# Patient Record
Sex: Female | Born: 1995
Health system: Southern US, Community
[De-identification: ages and names within clinical notes are randomized; demographics above are authoritative.]

## PROBLEM LIST (undated history)

## (undated) ENCOUNTER — Inpatient Hospital Stay (HOSPITAL_COMMUNITY): Payer: Self-pay

## (undated) DIAGNOSIS — F32A Depression, unspecified: Secondary | ICD-10-CM

## (undated) DIAGNOSIS — N189 Chronic kidney disease, unspecified: Secondary | ICD-10-CM

## (undated) DIAGNOSIS — F419 Anxiety disorder, unspecified: Secondary | ICD-10-CM

## (undated) DIAGNOSIS — F329 Major depressive disorder, single episode, unspecified: Secondary | ICD-10-CM

## (undated) DIAGNOSIS — N83209 Unspecified ovarian cyst, unspecified side: Secondary | ICD-10-CM

## (undated) DIAGNOSIS — K432 Incisional hernia without obstruction or gangrene: Secondary | ICD-10-CM

## (undated) DIAGNOSIS — N361 Urethral diverticulum: Secondary | ICD-10-CM

## (undated) DIAGNOSIS — R519 Headache, unspecified: Secondary | ICD-10-CM

## (undated) DIAGNOSIS — B999 Unspecified infectious disease: Secondary | ICD-10-CM

## (undated) DIAGNOSIS — A749 Chlamydial infection, unspecified: Secondary | ICD-10-CM

## (undated) HISTORY — PX: WISDOM TOOTH EXTRACTION: SHX21

## (undated) HISTORY — DX: Urethral diverticulum: N36.1

## (undated) HISTORY — DX: Anxiety disorder, unspecified: F41.9

## (undated) HISTORY — DX: Chlamydial infection, unspecified: A74.9

## (undated) HISTORY — PX: URETERAL STENT PLACEMENT: SHX822

---
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
## 2014-07-21 ENCOUNTER — Encounter (HOSPITAL_COMMUNITY): Payer: Self-pay | Admitting: *Deleted

## 2014-08-15 ENCOUNTER — Emergency Department (INDEPENDENT_AMBULATORY_CARE_PROVIDER_SITE_OTHER)
Admission: EM | Admit: 2014-08-15 | Discharge: 2014-08-15 | Disposition: A | Discharge status: Home / Self Care | Payer: Medicaid Other | Source: Home / Self Care | Attending: Emergency Medicine | Admitting: Emergency Medicine

## 2014-08-15 ENCOUNTER — Encounter (HOSPITAL_COMMUNITY): Payer: Self-pay | Admitting: Emergency Medicine

## 2014-08-15 DIAGNOSIS — M545 Low back pain, unspecified: Secondary | ICD-10-CM

## 2014-08-15 LAB — POCT URINALYSIS DIP (DEVICE)
Bilirubin Urine: NEGATIVE
Glucose, UA: NEGATIVE mg/dL
Ketones, ur: NEGATIVE mg/dL
NITRITE: NEGATIVE
PROTEIN: NEGATIVE mg/dL
Specific Gravity, Urine: 1.025 (ref 1.005–1.030)
UROBILINOGEN UA: 0.2 mg/dL (ref 0.0–1.0)
pH: 6 (ref 5.0–8.0)

## 2014-08-15 LAB — POCT PREGNANCY, URINE: PREG TEST UR: NEGATIVE

## 2014-08-15 MED ORDER — IBUPROFEN 800 MG PO TABS: 800.0000 mg | ORAL_TABLET | Freq: Three times a day (TID) | ORAL | Status: DC | PRN

## 2014-08-15 NOTE — ED Notes (Signed)
C/o lower back pain onset 3 weeks Pain is getting worse; increases w/activity Denies abd pain, urinary sx, f/v/n/d, denies inj/trauma Alert, no signs of acute distress.

## 2014-08-15 NOTE — Discharge Instructions (Signed)
Back Exercises °Back exercises help treat and prevent back injuries. The goal is to increase your strength in your belly (abdominal) and back muscles. These exercises can also help with flexibility. Start these exercises when told by your doctor. °HOME CARE °Back exercises include: °Pelvic Tilt. °· Lie on your back with your knees bent. Tilt your pelvis until the lower part of your back is against the floor. Hold this position 5 to 10 sec. Repeat this exercise 5 to 10 times. °Knee to Chest. °· Pull 1 knee up against your chest and hold for 20 to 30 seconds. Repeat this with the other knee. This may be done with the other leg straight or bent, whichever feels better. Then, pull both knees up against your chest. °Sit-Ups or Curl-Ups. °· Bend your knees 90 degrees. Start with tilting your pelvis, and do a partial, slow sit-up. Only lift your upper half 30 to 45 degrees off the floor. Take at least 2 to 3 seonds for each sit-up. Do not do sit-ups with your knees out straight. If partial sit-ups are difficult, simply do the above but with only tightening your belly (abdominal) muscles and holding it as told. °Hip-Lift. °· Lie on your back with your knees flexed 90 degrees. Push down with your feet and shoulders as you raise your hips 2 inches off the floor. Hold for 10 seconds, repeat 5 to 10 times. °Back Arches. °· Lie on your stomach. Prop yourself up on bent elbows. Slowly press on your hands, causing an arch in your low back. Repeat 3 to 5 times. °Shoulder-Lifts. °· Lie face down with arms beside your body. Keep hips and belly pressed to floor as you slowly lift your head and shoulders off the floor. °Do not overdo your exercises. Be careful in the beginning. Exercises may cause you some mild back discomfort. If the pain lasts for more than 15 minutes, stop the exercises until you see your doctor. Improvement with exercise for back problems is slow.  °Document Released: 11/11/2010 Document Revised: 01/01/2012  Document Reviewed: 08/10/2011 °ExitCare® Patient Information ©2015 [employer]. This information is not intended to replace advice given to you by your health care provider. Make sure you discuss any questions you have with your health care provider. °Back Pain, Adult °Low back pain is very common. About 1 in 5 people have back pain. The cause of low back pain is rarely dangerous. The pain often gets better over time. About half of people with a sudden onset of back pain feel better in just 2 weeks. About 8 in 10 people feel better by 6 weeks.  °CAUSES °Some common causes of back pain include: °· Strain of the muscles or ligaments supporting the spine. °· Wear and tear (degeneration) of the spinal discs. °· Arthritis. °· Direct injury to the back. °DIAGNOSIS °Most of the time, the direct cause of low back pain is not known. However, back pain can be treated effectively even when the exact cause of the pain is unknown. Answering your caregiver's questions about your overall health and symptoms is one of the most accurate ways to make sure the cause of your pain is not dangerous. If your caregiver needs more information, he or she may order lab work or imaging tests (X-rays or MRIs). However, even if imaging tests show changes in your back, this usually does not require surgery. °HOME CARE INSTRUCTIONS °For many people, back pain returns. Since low back pain is rarely dangerous, it is often a condition that people can learn to manage on their   own.  °· Remain active. It is stressful on the back to sit or stand in one place. Do not sit, drive, or stand in one place for more than 30 minutes at a time. Take short walks on level surfaces as soon as pain allows. Try to increase the length of time you walk each day. °· Do not stay in bed. Resting more than 1 or 2 days can delay your recovery. °· Do not avoid exercise or work. Your body is made to move. It is not dangerous to be active, even though your back may hurt. Your  back will likely heal faster if you return to being active before your pain is gone. °· Pay attention to your body when you  bend and lift. Many people have less discomfort when lifting if they bend their knees, keep the load close to their bodies, and avoid twisting. Often, the most comfortable positions are those that put less stress on your recovering back. °· Find a comfortable position to sleep. Use a firm mattress and lie on your side with your knees slightly bent. If you lie on your back, put a pillow under your knees. °· Only take over-the-counter or prescription medicines as directed by your caregiver. Over-the-counter medicines to reduce pain and inflammation are often the most helpful. Your caregiver may prescribe muscle relaxant drugs. These medicines help dull your pain so you can more quickly return to your normal activities and healthy exercise. °· Put ice on the injured area. °¨ Put ice in a plastic bag. °¨ Place a towel between your skin and the bag. °¨ Leave the ice on for 15-20 minutes, 03-04 times a day for the first 2 to 3 days. After that, ice and heat may be alternated to reduce pain and spasms. °· Ask your caregiver about trying back exercises and gentle massage. This may be of some benefit. °· Avoid feeling anxious or stressed. Stress increases muscle tension and can worsen back pain. It is important to recognize when you are anxious or stressed and learn ways to manage it. Exercise is a great option. °SEEK MEDICAL CARE IF: °· You have pain that is not relieved with rest or medicine. °· You have pain that does not improve in 1 week. °· You have new symptoms. °· You are generally not feeling well. °SEEK IMMEDIATE MEDICAL CARE IF:  °· You have pain that radiates from your back into your legs. °· You develop new bowel or bladder control problems. °· You have unusual weakness or numbness in your arms or legs. °· You develop nausea or vomiting. °· You develop abdominal pain. °· You feel  faint. °Document Released: 10/09/2005 Document Revised: 04/09/2012 Document Reviewed: 02/10/2014 °ExitCare® Patient Information ©2015 [employer]. This information is not intended to replace advice given to you by your health care provider. Make sure you discuss any questions you have with your health care provider. ° °

## 2014-08-15 NOTE — ED Provider Notes (Signed)
CSN: [id number]     Arrival date & time 08/15/14  1213 History   First MD Initiated Contact with Patient 08/15/14 1236     Chief Complaint  Patient presents with  . Back Pain   (Consider location/radiation/quality/duration/timing/severity/associated sxs/prior Treatment) HPI      18 year old female presents for evaluation of low back pain. This started 3 weeks again. It is midline and outflow left side of her back. She denies any extremity numbness or weakness, no loss of bowel or bladder control. Denies any trauma. She recently gave birth to her first child. No history of IV drug use. No significant history of back pain.  Past Medical History  Diagnosis Date  . Incisional hernia 04/2013   Past Surgical History  Procedure Laterality Date  . Wisdom tooth extraction    . Ercp  03/12/2012    Procedure: ENDOSCOPIC RETROGRADE CHOLANGIOPANCREATOGRAPHY (ERCP);  Surgeon: Petra KubaMarc E Magod, MD;  [facility];  Service: Endoscopy;  Laterality: N/A;  . Cholecystectomy  04/03/2012    Procedure: LAPAROSCOPIC CHOLECYSTECTOMY WITH INTRAOPERATIVE CHOLANGIOGRAM;  Surgeon: Velora Hecklerodd M Gerkin, MD;  [facility];  Service: General;  Laterality: N/A;  . Inguinal hernia repair N/A 05/15/2013    Procedure: HERNIA REPAIR INCISIONAL;  Surgeon: Velora Hecklerodd M Gerkin, MD;  Location: [facility];  Service: General;  Laterality: N/A;  . Insertion of mesh N/A 05/15/2013    Procedure: INSERTION OF MESH;  Surgeon: Velora Hecklerodd M Gerkin, MD;  Location: [facility];  Service: General;  Laterality: N/A;   Family History  Problem Relation Age of Onset  . Hypertension Mother   . Diabetes Paternal Grandmother   . Cancer Paternal Grandfather   . Seizures Paternal Grandfather   . Hypertension Father    History  Substance Use Topics  . Smoking status: Never Smoker   . Smokeless tobacco: Never Used  . Alcohol Use: No   OB History   Grav Para Term Preterm Abortions TAB SAB Ect Mult Living   2 1 0 1 0 0 0 0 0 1      Review of Systems  Musculoskeletal: Positive for back pain.  All other systems reviewed and are negative.   Allergies  Penicillins  Home Medications   Prior to Admission medications   Medication Sig Start Date End Date Taking? Authorizing Provider  ibuprofen (ADVIL,MOTRIN) 800 MG tablet Take 1 tablet (800 mg total) by mouth every 8 (eight) hours as needed. 08/15/14   Graylon GoodZachary H Kasarah Sitts, PA-C  Prenatal Vit-Fe Fumarate-FA (PRENATAL MULTIVITAMIN) TABS tablet Take 1 tablet by mouth daily at 12 noon.    Historical Provider, MD   BP 127/84  Pulse 79  Temp(Src) 98.7 F (37.1 C)  Resp 12  SpO2 97%  Breastfeeding? Yes Physical Exam  Nursing note and vitals reviewed. Constitutional: She is oriented to person, place, and time. Vital signs are normal. She appears well-developed and well-nourished. No distress.  HENT:  Head: Normocephalic and atraumatic.  Pulmonary/Chest: Effort normal. No respiratory distress.  Musculoskeletal:       Lumbar back: She exhibits tenderness (Left paraspinous musculature). She exhibits normal range of motion, no bony tenderness, no swelling, no edema, no deformity, no pain and no spasm.  Neurological: She is alert and oriented to person, place, and time. She has normal strength and normal reflexes. No sensory deficit. She exhibits normal muscle tone. Coordination and gait normal.  Skin: Skin is warm and dry. No rash noted. She is not diaphoretic.  Psychiatric: She has a normal  mood and affect. Judgment normal.    ED Course  Procedures (including critical care time) Labs Review Labs Reviewed  POCT URINALYSIS DIP (DEVICE) - Abnormal; Notable for the following:    Hgb urine dipstick TRACE (*)    Leukocytes, UA MODERATE (*)    All other components within normal limits  POCT PREGNANCY, URINE    Imaging Review No results found.   MDM   1. Bilateral low back pain without sciatica    No red flags. Treat symptomatically with ibuprofen. Followup when  necessary  Meds ordered this encounter  Medications  . DISCONTD: ibuprofen (ADVIL,MOTRIN) 800 MG tablet    Sig: Take 1 tablet (800 mg total) by mouth every 8 (eight) hours as needed.    Dispense:  60 tablet    Refill:  0    Order Specific Question:  Supervising Provider    Answer:  Clementeen GrahamOREY, EVAN, S [id number]]  . ibuprofen (ADVIL,MOTRIN) 800 MG tablet    Sig: Take 1 tablet (800 mg total) by mouth every 8 (eight) hours as needed.    Dispense:  60 tablet    Refill:  0    Order Specific Question:  Supervising Provider    Answer:  Clementeen GrahamOREY, EVAN, S [[id number]]   \    Graylon GoodZachary H Olar Santini, PA-C 08/15/14 1459

## 2014-08-16 NOTE — ED Provider Notes (Signed)
Medical screening examination/treatment/procedure(s) were performed by non-physician practitioner and as supervising physician I was immediately available for consultation/collaboration.  Cecilie Heidel, M.D.  Akeila Lana C Angelina Venard, MD 08/16/14 0839 

## 2014-08-23 ENCOUNTER — Emergency Department (HOSPITAL_COMMUNITY)
Admission: EM | Admit: 2014-08-23 | Discharge: 2014-08-24 | Disposition: A | Discharge status: Home / Self Care | Payer: Medicaid Other | Attending: Emergency Medicine | Admitting: Emergency Medicine

## 2014-08-23 ENCOUNTER — Encounter (HOSPITAL_COMMUNITY): Payer: Self-pay | Admitting: Emergency Medicine

## 2014-08-23 DIAGNOSIS — R42 Dizziness and giddiness: Secondary | ICD-10-CM | POA: Insufficient documentation

## 2014-08-23 DIAGNOSIS — Z3202 Encounter for pregnancy test, result negative: Secondary | ICD-10-CM | POA: Insufficient documentation

## 2014-08-23 DIAGNOSIS — N926 Irregular menstruation, unspecified: Secondary | ICD-10-CM

## 2014-08-23 DIAGNOSIS — Z880 Allergy status to penicillin: Secondary | ICD-10-CM | POA: Insufficient documentation

## 2014-08-23 DIAGNOSIS — N925 Other specified irregular menstruation: Secondary | ICD-10-CM | POA: Insufficient documentation

## 2014-08-23 DIAGNOSIS — R197 Diarrhea, unspecified: Secondary | ICD-10-CM | POA: Diagnosis not present

## 2014-08-23 DIAGNOSIS — R1084 Generalized abdominal pain: Secondary | ICD-10-CM | POA: Insufficient documentation

## 2014-08-23 DIAGNOSIS — J028 Acute pharyngitis due to other specified organisms: Secondary | ICD-10-CM | POA: Insufficient documentation

## 2014-08-23 DIAGNOSIS — J029 Acute pharyngitis, unspecified: Secondary | ICD-10-CM | POA: Diagnosis present

## 2014-08-23 DIAGNOSIS — Z79899 Other long term (current) drug therapy: Secondary | ICD-10-CM | POA: Insufficient documentation

## 2014-08-23 DIAGNOSIS — B9789 Other viral agents as the cause of diseases classified elsewhere: Secondary | ICD-10-CM | POA: Diagnosis not present

## 2014-08-23 NOTE — ED Notes (Signed)
Pt states she has a sore throat and is very dizzy  Pt states sxs started 3 days ago  Pt states she has been having diarrhea since yesterday and her stomach hurts

## 2014-08-24 ENCOUNTER — Encounter (HOSPITAL_COMMUNITY): Payer: Self-pay | Admitting: Emergency Medicine

## 2014-08-24 LAB — URINALYSIS, ROUTINE W REFLEX MICROSCOPIC
Bilirubin Urine: NEGATIVE
Glucose, UA: NEGATIVE mg/dL
Hgb urine dipstick: NEGATIVE
Ketones, ur: NEGATIVE mg/dL
Nitrite: NEGATIVE
Protein, ur: NEGATIVE mg/dL
Specific Gravity, Urine: 1.021 (ref 1.005–1.030)
Urobilinogen, UA: 1 mg/dL (ref 0.0–1.0)
pH: 7 (ref 5.0–8.0)

## 2014-08-24 LAB — CBC WITH DIFFERENTIAL/PLATELET
Basophils Absolute: 0 10*3/uL (ref 0.0–0.1)
Basophils Relative: 0 % (ref 0–1)
Eosinophils Absolute: 0.1 10*3/uL (ref 0.0–0.7)
Eosinophils Relative: 1 % (ref 0–5)
HCT: 42.9 % (ref 36.0–46.0)
Hemoglobin: 14.8 g/dL (ref 12.0–15.0)
Lymphocytes Relative: 33 % (ref 12–46)
Lymphs Abs: 2.7 10*3/uL (ref 0.7–4.0)
MCH: 31.2 pg (ref 26.0–34.0)
MCHC: 34.5 g/dL (ref 30.0–36.0)
MCV: 90.3 fL (ref 78.0–100.0)
Monocytes Absolute: 0.5 10*3/uL (ref 0.1–1.0)
Monocytes Relative: 6 % (ref 3–12)
Neutro Abs: 4.9 10*3/uL (ref 1.7–7.7)
Neutrophils Relative %: 60 % (ref 43–77)
Platelets: 260 10*3/uL (ref 150–400)
RBC: 4.75 MIL/uL (ref 3.87–5.11)
RDW: 12.8 % (ref 11.5–15.5)
WBC: 8.1 10*3/uL (ref 4.0–10.5)

## 2014-08-24 LAB — COMPREHENSIVE METABOLIC PANEL
ALT: 50 U/L — ABNORMAL HIGH (ref 0–35)
AST: 45 U/L — ABNORMAL HIGH (ref 0–37)
Albumin: 4.3 g/dL (ref 3.5–5.2)
Alkaline Phosphatase: 123 U/L — ABNORMAL HIGH (ref 39–117)
Anion gap: 14 (ref 5–15)
BUN: 13 mg/dL (ref 6–23)
CO2: 22 mEq/L (ref 19–32)
Calcium: 9.2 mg/dL (ref 8.4–10.5)
Chloride: 102 mEq/L (ref 96–112)
Creatinine, Ser: 0.57 mg/dL (ref 0.50–1.10)
GFR calc Af Amer: >90 mL/min (ref >90)
GFR calc non Af Amer: >90 mL/min (ref >90)
Glucose, Bld: 95 mg/dL (ref 70–99)
Potassium: 3.9 mEq/L (ref 3.7–5.3)
Sodium: 138 mEq/L (ref 137–147)
Total Bilirubin: 0.3 mg/dL (ref 0.3–1.2)
Total Protein: 8.2 g/dL (ref 6.0–8.3)

## 2014-08-24 LAB — URINE MICROSCOPIC-ADD ON

## 2014-08-24 LAB — POC URINE PREG, ED: Preg Test, Ur: NEGATIVE

## 2014-08-24 LAB — RAPID STREP SCREEN (MED CTR MEBANE ONLY): Streptococcus, Group A Screen (Direct): NEGATIVE

## 2014-08-24 NOTE — Discharge Instructions (Signed)
°Emergency Department Resource Guide °1) Find a Doctor and Pay Out of Pocket °Although you won't have to find out who is covered by your insurance plan, it is a good idea to ask around and get recommendations. You will then need to call the office and see if the doctor you have chosen will accept you as a new patient and what types of options they offer for patients who are self-pay. Some doctors offer discounts or will set up payment plans for their patients who do not have insurance, but you will need to ask so you aren't surprised when you get to your appointment. ° °2) Contact Your Local Health Department °Not all health departments have doctors that can see patients for sick visits, but many do, so it is worth a call to see if yours does. If you don't know where your local health department is, you can check in your phone book. The CDC also has a tool to help you locate your state's health department, and many state websites also have listings of all of their local health departments. ° °3) Find a Walk-in Clinic °If your illness is not likely to be very severe or complicated, you may want to try a walk in clinic. These are popping up all over the country in pharmacies, drugstores, and shopping centers. They're usually staffed by nurse practitioners or physician assistants that have been trained to treat common illnesses and complaints. They're usually fairly quick and inexpensive. However, if you have serious medical issues or chronic medical problems, these are probably not your best option. ° °No Primary Care Doctor: °- Call [facility] at  832-8000 - they can help you locate a primary care doctor that  accepts your insurance, provides certain services, etc. °- Physician Referral Service- 1-800-533-3463 ° °Chronic Pain Problems: °Organization         Address  Phone   Notes  °[facility]  (336) 297-2271 Patients need to be referred by their primary care doctor.  ° °Medication  Assistance: °Organization         Address  Phone   Notes  °[place] [facility] [address] °[place], [place] [address] (336) 641-8030 --Must be a resident of [place] °-- Must have NO insurance coverage whatsoever (no Medicaid/ Medicare, etc.) °-- The pt. MUST have a primary care doctor that directs their care regularly and follows them in the community °  °[facility]  (866) 331-1348   °[employer]  (888) 892-1162   ° °Agencies that provide inexpensive medical care: °Organization         Address  Phone   Notes  °[facility]  (336) 832-8035   °[facility]    (336) 832-7272   °[facility] [address] °[place], [place] [address] (336) 832-4777   °[facility] [address], °[place] (336) 271-4999   °[facility]    (336) 373-0678   °[facility]    (336) 272-1050   °[facility] ° [address], [place] Phone:  (336) 832-4444, Fax:  (336) 832-4440 Hours of Operation:  9 am - 6 pm, M-F.  Also accepts Medicaid/Medicare and self-pay.  °[facility] ° [address], [place] Phone: (336) 832-3150, Fax: (336) 832-3151. Hours of Operation:  8:30 am - 5:30 pm, M-F.  Also accepts Medicaid and self-pay.  °[facility] [place] [address], [place] Phone: (336) 878-6027   °[facility] [address], [place], [place] (336)723-1848, Ext. 123 Mondays & Thursdays: 7-9 AM.  First 15 patients are seen on a first come, first serve basis. °  ° °Medicaid-accepting [place] Providers: ° °Organization         Address  Phone   Notes  °[facility] [address], [place] (336) 641-2100 Also accepts self-pay patients.  °[facility] [address], [place] ° (336) 856-9996   °[facility] [address], [place]  (336) 288-8857   °[facility] [address] [place] [address], [place] (336) 299-7000   °Veita Bland [address], [place]  ° (336) 373-1557 Only accepts [place] Access Medicaid patients after they have their name applied to their card.  ° °Self-Pay (no insurance) in [place]: ° °Organization         Address  Phone   Notes  °Sickle Cell Patients, [facility] [address], [place] (336) 832-1970   °[facility] [address], [place] (336) 832-4400   °[facility] ° [address], [place] (336) 992-4800   °[facility]/Dr. Osei-Bonsu ° [address] [place] [address], [place] or [address], [place] (336) 841-8500 Phone number for both [place] and [place] locations is the same.  °[facility] [address], [place] (336) 299-0000   °[facility] [address] [place] [address], [place] or [address] (336) 852-7530 °(336) 878-2260   °[facility] [address], [place] (336) 350-1642, phone; (336) 294-5005, fax Sees patients 1st and 3rd Saturday of every month.  Must not qualify for public or private insurance (i.e. Medicaid, Medicare, [place] Health Choice, Veterans' Benefits) • Household income should be no more than 200% of the poverty level •The clinic cannot treat you if you are pregnant or think you are pregnant • Sexually transmitted diseases are not treated at the clinic.  ° ° °Dental Care: °Organization         Address  Phone  Notes  °[place] [facility] [address], [place] (336) 641-6152 Accepts children up to age 21 who are enrolled in Medicaid or [place] Health Choice; pregnant women with a Medicaid card; and children who have applied for Medicaid or [place] Health Choice, but were declined, whose parents can pay a reduced fee at time of service.  °[place]  [facility] [place]  [address], [place] (336) 641-7733 Accepts children up to age 21 who are enrolled in Medicaid or [place] Health Choice; pregnant women with a Medicaid card; and children who have applied for Medicaid or [place] Health Choice, but were declined, whose parents can pay a reduced fee at time of service.  °[facility] ° [address], [place] (336) 641-4533 Patients are seen by appointment only. Walk-ins are not accepted. [facility] will see patients 18 years of age and older. °Monday - Tuesday (8am-5pm) °Most Wednesdays (8:30-5pm) °$30 per visit, cash only  °[facility] ° [address], [place] (336) 641-4533 Patients are seen by appointment only. Walk-ins are not accepted. [facility] will see patients 18 years of age and older. °One   Wednesday Evening (Monthly: Volunteer Based).  $30 per visit, cash only  °[facility]  (919) 537-3737 for adults; Children under age 4, call Graduate Pediatric Dentistry at (919) [phone]. Children aged 4-14, please call (919) [phone] to request a pediatric application. ° Dental services are provided in all areas of dental care including fillings, crowns and bridges, complete and partial dentures, implants, gum treatment, root canals, and extractions. Preventive care is also provided. Treatment is provided to both adults and children. °Patients are selected via a lottery and there is often a waiting list. °  °[facility] [address], °[place] ° (336) 763-8833 [email or web address] °  °[facility] [address], [place], [place] (336)723-1848, Ext. 123 Second and Fourth Thursday of each month, opens at 6:30 AM; Clinic ends at 9 AM.  Patients are seen on a first-come first-served basis, and a limited number are seen during each clinic.  ° °[facility] ° [address], [place], [place] (336) 723-7904    Eligibility Requirements °You must have lived in [place], [place], or [place] counties for at least the last three months. °  You cannot be eligible for state or federal sponsored healthcare insurance, including Veterans Administration, Medicaid, or Medicare. °  You generally cannot be eligible for healthcare insurance through your employer.  °  How to apply: °Eligibility screenings are held every Tuesday and Wednesday afternoon from 1:00 pm until 4:00 pm. You do not need an appointment for the interview!  °[facility] [address], [place], [place] 336-631-2330   °[facility]  336-342-8273   °[facility]  336-703-3100   °[facility]  336-570-6415   ° °Behavioral Health Resources in the Community: °Intensive Outpatient Programs °Organization         Address  Phone  Notes  °[facility] [address], [place], [place] 336-878-6098   °[facility] [address], [place], [place] 336-832-9800   °[facility] [address], [place], [place] ° 336-882-2125   °[facility] [address],  °[place], [place] 1-800-853-5163 or 336-641-4981   °Substance Abuse Resources °Organization         Address  Phone  Notes  °[facility]  336-882-2125   °[facility]  336-784-9470   °[facility]  336-285-9073   °[facility]  336-845-3988   °Residential & Outpatient Substance Abuse Program  1-800-659-3381   °Psychological Services °Organization         Address  Phone  Notes  °[facility]  336- 832-9600   °[facility]  336- 378-7881   °[facility] [address], [place] 1-800-853-5163 or 336-641-4981   ° °Mobile Crisis Teams °Organization         Address  Phone  Notes  °[facility], Mobile Crisis Care Unit  1-877-626-1772   °Assertive °[facility] ° [address]  [place], [place] 336-834-9664   °Sharon DeEsch [address] °[place] 336-554-5454   ° °Self-Help/Support Groups °Organization         Address  Phone             Notes  °[facility] - variety of support groups  336- 373-1402 Call for more information  °Narcotics Anonymous (NA), [facility] [address], °[place]   2 meetings at this location  ° °  Residential Treatment Programs °Organization         Address  Phone  Notes  °[facility] [address],    °[place]  1-866-801-8205   °[facility] ° [address], [place], [place] 704-293-8524   °[facility] [address], [place] 336-845-3988 Admissions: 8am-3pm M-F  °[facility] [address]    °[place], [place] 336-841-1104   °[facility] [address], [place], [place] 336-379-7146   °[facility] [address]  °[place], [place] 336-285-9073   °[facility] - Intensive Outpatient [address], [place], [place] 336-852-3033   °[facility] [address]  °[place], [place] 1-877-615-2722 or 336-784-9470   °[facility] [address] [place], [place] 336-227-7417 Accepts Medicaid  °[facility] [address]  °[place] 1-800-659-3381 Substance Abuse/Addiction Treatment  ° °[place] Behavioral Health Resources °Organization         Address  Phone  Notes  °[facility]  (888) 581-9988   °Julie Brannon, PhD [address] [place], [place]   (336) 349-5553 or (336) 951-0000   °[facility]   [address] °[place], [place] (336) 349-4454   °[facility] [address], [place], [place] (336) 342-8316 Insurance/Medicaid/sponsorship through [facility]  °[facility] [address]                                    [place], [place] (336) 342-8316 Therapy/tele-psych/case    °[facility] [address]  ° [place], [place] (336) 349-2233    °Dr. Arfeen  (336) 349-4544   °[facility] [place]  [employer] [place] [facility] 1) [address],  °2) [address], [place] °3)  [address], [place] (336) 349-3220 °(336) 342-7768 ° °(336) 342-8140   °[place] [facility] Abuse [facility] (336) 342-1394 or (336) 342-3537 (After Hours)    ° ° °

## 2014-08-25 LAB — CULTURE, GROUP A STREP

## 2014-08-31 NOTE — ED Provider Notes (Signed)
CSN: [id number]     Arrival date & time 08/23/14  2222 History   First MD Initiated Contact with Patient 08/24/14 0153     Chief Complaint  Patient presents with  . Sore Throat  . Dizziness     (Consider location/radiation/quality/duration/timing/severity/associated sxs/prior Treatment) HPI   18yF with sore throat. Onset about 3 days ago. Initially, patient feels dizzy. She just Sensation of feeling off balance or that she may pass out. Denies vertigo. Mild diffuse, crampy abdominal pain. She has been having some diarrhea. No urinary complaints. No unusual vaginal bleeding or discharge. She does has concerns she may be pregnant. She did have a home pregnancy test which was positive. She is sexually active.   Past Medical History  Diagnosis Date  . Incisional hernia 04/2013   Past Surgical History  Procedure Laterality Date  . Wisdom tooth extraction    . Ercp  03/12/2012    Procedure: ENDOSCOPIC RETROGRADE CHOLANGIOPANCREATOGRAPHY (ERCP);  Surgeon: Petra KubaMarc E Magod, MD;  [facility];  Service: Endoscopy;  Laterality: N/A;  . Cholecystectomy  04/03/2012    Procedure: LAPAROSCOPIC CHOLECYSTECTOMY WITH INTRAOPERATIVE CHOLANGIOGRAM;  Surgeon: Velora Hecklerodd M Gerkin, MD;  [facility];  Service: General;  Laterality: N/A;  . Inguinal hernia repair N/A 05/15/2013    Procedure: HERNIA REPAIR INCISIONAL;  Surgeon: Velora Hecklerodd M Gerkin, MD;  Location: [facility];  Service: General;  Laterality: N/A;  . Insertion of mesh N/A 05/15/2013    Procedure: INSERTION OF MESH;  Surgeon: Velora Hecklerodd M Gerkin, MD;  Location: [facility];  Service: General;  Laterality: N/A;   Family History  Problem Relation Age of Onset  . Hypertension Mother   . Diabetes Paternal Grandmother   . Cancer Paternal Grandfather   . Seizures Paternal Grandfather   . Hypertension Father    History  Substance Use Topics  . Smoking status: Never Smoker   . Smokeless tobacco: Never Used  . Alcohol Use: No   OB  History    Gravida Para Term Preterm AB TAB SAB Ectopic Multiple Living   2 1 0 1 0 0 0 0 0 1      Review of Systems  All systems reviewed and negative, other than as noted in HPI.   Allergies  Penicillins  Home Medications   Prior to Admission medications   Medication Sig Start Date End Date Taking? Authorizing Provider  Prenatal Vit-Fe Fumarate-FA (PRENATAL MULTIVITAMIN) TABS tablet Take 1 tablet by mouth daily at 12 noon.   Yes Historical Provider, MD  ibuprofen (ADVIL,MOTRIN) 800 MG tablet Take 1 tablet (800 mg total) by mouth every 8 (eight) hours as needed. 08/15/14   Adrian BlackwaterZachary H Baker, PA-C   BP 127/57 mmHg  Pulse 86  Temp(Src) 97.9 F (36.6 C) (Oral)  Resp 18  Ht 5\' 5"  (1.651 m)  Wt 192 lb (87.091 kg)  BMI 31.95 kg/m2  SpO2 100%  LMP 07/25/2014 Physical Exam  Constitutional: She appears well-developed and well-nourished. No distress.  HENT:  Head: Normocephalic and atraumatic.  Posterior pharynx beefy. Uvula midline. Handling secretions. Normal sounding phonation. Neck supple. No nodes.   Eyes: Conjunctivae and EOM are normal. Pupils are equal, round, and reactive to light. Right eye exhibits no discharge. Left eye exhibits no discharge.  Neck: Neck supple.  Cardiovascular: Normal rate, regular rhythm and normal heart sounds.  Exam reveals no gallop and no friction rub.   No murmur heard. Pulmonary/Chest: Effort normal and breath sounds normal. No respiratory distress.  Abdominal:  Soft. She exhibits no distension. There is no tenderness.  Musculoskeletal: She exhibits no edema or tenderness.  Neurological: She is alert.  Skin: Skin is warm and dry. She is not diaphoretic.  Psychiatric: She has a normal mood and affect. Her behavior is normal. Thought content normal.  Nursing note and vitals reviewed.   ED Course  Procedures (including critical care time) Labs Review Labs Reviewed  URINALYSIS, ROUTINE W REFLEX MICROSCOPIC - Abnormal; Notable for the following:     Leukocytes, UA MODERATE (*)    All other components within normal limits  COMPREHENSIVE METABOLIC PANEL - Abnormal; Notable for the following:    AST 45 (*)    ALT 50 (*)    Alkaline Phosphatase 123 (*)    All other components within normal limits  RAPID STREP SCREEN  CULTURE, GROUP A STREP  CBC WITH DIFFERENTIAL  URINE MICROSCOPIC-ADD ON  POC URINE PREG, ED    Imaging Review No results found.   EKG Interpretation None      MDM   Final diagnoses:  Viral pharyngitis  Menstrual period late    18yF with likely viral pharyngitis. Seems more concerned about positive home pregnancy test though. Negative in ED. W/u fairly unremarkable. I feel stable at this time. No evidence of serious deep space head/neck infection.     Raeford RazorStephen Kalissa Grays, MD 09/02/14 [phone]

## 2014-12-29 ENCOUNTER — Encounter (HOSPITAL_COMMUNITY): Payer: Self-pay

## 2014-12-29 ENCOUNTER — Emergency Department (HOSPITAL_COMMUNITY): Payer: Medicaid Other

## 2014-12-29 ENCOUNTER — Emergency Department (HOSPITAL_COMMUNITY)
Admission: EM | Admit: 2014-12-29 | Discharge: 2014-12-29 | Disposition: A | Discharge status: Home / Self Care | Payer: Medicaid Other | Attending: Emergency Medicine | Admitting: Emergency Medicine

## 2014-12-29 DIAGNOSIS — M542 Cervicalgia: Secondary | ICD-10-CM

## 2014-12-29 DIAGNOSIS — O9A212 Injury, poisoning and certain other consequences of external causes complicating pregnancy, second trimester: Secondary | ICD-10-CM | POA: Insufficient documentation

## 2014-12-29 DIAGNOSIS — S161XXA Strain of muscle, fascia and tendon at neck level, initial encounter: Secondary | ICD-10-CM | POA: Diagnosis not present

## 2014-12-29 DIAGNOSIS — H9201 Otalgia, right ear: Secondary | ICD-10-CM

## 2014-12-29 DIAGNOSIS — Y939 Activity, unspecified: Secondary | ICD-10-CM | POA: Insufficient documentation

## 2014-12-29 DIAGNOSIS — H9190 Unspecified hearing loss, unspecified ear: Secondary | ICD-10-CM | POA: Diagnosis not present

## 2014-12-29 DIAGNOSIS — S0083XA Contusion of other part of head, initial encounter: Secondary | ICD-10-CM

## 2014-12-29 DIAGNOSIS — Y999 Unspecified external cause status: Secondary | ICD-10-CM | POA: Diagnosis not present

## 2014-12-29 DIAGNOSIS — Z880 Allergy status to penicillin: Secondary | ICD-10-CM | POA: Diagnosis not present

## 2014-12-29 DIAGNOSIS — Z3A22 22 weeks gestation of pregnancy: Secondary | ICD-10-CM | POA: Insufficient documentation

## 2014-12-29 DIAGNOSIS — Y92410 Unspecified street and highway as the place of occurrence of the external cause: Secondary | ICD-10-CM | POA: Insufficient documentation

## 2014-12-29 DIAGNOSIS — S0991XA Unspecified injury of ear, initial encounter: Secondary | ICD-10-CM | POA: Insufficient documentation

## 2014-12-29 DIAGNOSIS — Z8719 Personal history of other diseases of the digestive system: Secondary | ICD-10-CM | POA: Diagnosis not present

## 2014-12-29 DIAGNOSIS — S43402A Unspecified sprain of left shoulder joint, initial encounter: Secondary | ICD-10-CM

## 2014-12-29 DIAGNOSIS — S0990XA Unspecified injury of head, initial encounter: Secondary | ICD-10-CM | POA: Diagnosis not present

## 2014-12-29 MED ORDER — ACETAMINOPHEN 325 MG PO TABS
650.0000 mg | ORAL_TABLET | Freq: Once | ORAL | Status: AC
  Administered 2014-12-29: 650 mg via ORAL
  Filled 2014-12-29: qty 2
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
## 2014-12-29 NOTE — ED Notes (Signed)
Rapid Response OB at bedside.  
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
## 2014-12-29 NOTE — Progress Notes (Signed)
This note also relates to the following rows which could not be included: Pulse Rate - Cannot attach notes to unvalidated device data SpO2 - Cannot attach notes to unvalidated device data   Fetal movement palpated.
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
## 2014-12-29 NOTE — ED Notes (Signed)
Pt stable, ambulatory, pain 6/10, refuses wheelchair
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
## 2015-02-05 ENCOUNTER — Encounter (HOSPITAL_COMMUNITY): Payer: Self-pay | Admitting: *Deleted

## 2015-02-05 ENCOUNTER — Inpatient Hospital Stay (HOSPITAL_COMMUNITY)
Admission: AD | Admit: 2015-02-05 | Discharge: 2015-02-05 | Disposition: A | Discharge status: Home / Self Care | Payer: Medicaid Other | Source: Ambulatory Visit | Attending: Obstetrics & Gynecology | Admitting: Obstetrics & Gynecology

## 2015-02-05 DIAGNOSIS — R42 Dizziness and giddiness: Secondary | ICD-10-CM | POA: Insufficient documentation

## 2015-02-05 DIAGNOSIS — Z3A27 27 weeks gestation of pregnancy: Secondary | ICD-10-CM | POA: Diagnosis not present

## 2015-02-05 DIAGNOSIS — M545 Low back pain: Secondary | ICD-10-CM | POA: Insufficient documentation

## 2015-02-05 DIAGNOSIS — O9989 Other specified diseases and conditions complicating pregnancy, childbirth and the puerperium: Secondary | ICD-10-CM | POA: Diagnosis not present

## 2015-02-05 DIAGNOSIS — O26893 Other specified pregnancy related conditions, third trimester: Secondary | ICD-10-CM | POA: Diagnosis not present

## 2015-02-05 DIAGNOSIS — R519 Headache, unspecified: Secondary | ICD-10-CM

## 2015-02-05 DIAGNOSIS — R51 Headache: Secondary | ICD-10-CM | POA: Insufficient documentation

## 2015-02-05 HISTORY — DX: Chronic kidney disease, unspecified: N18.9

## 2015-02-05 HISTORY — DX: Depression, unspecified: F32.A

## 2015-02-05 HISTORY — DX: Major depressive disorder, single episode, unspecified: F32.9

## 2015-02-05 LAB — URINALYSIS, ROUTINE W REFLEX MICROSCOPIC
Bilirubin Urine: NEGATIVE
GLUCOSE, UA: NEGATIVE mg/dL
Hgb urine dipstick: NEGATIVE
KETONES UR: NEGATIVE mg/dL
Nitrite: NEGATIVE
Protein, ur: NEGATIVE mg/dL
SPECIFIC GRAVITY, URINE: 1.015 (ref 1.005–1.030)
Urobilinogen, UA: 0.2 mg/dL (ref 0.0–1.0)
pH: 7 (ref 5.0–8.0)

## 2015-02-05 LAB — URINE MICROSCOPIC-ADD ON

## 2015-02-05 MED ORDER — TRAMADOL HCL 50 MG PO TABS
50.0000 mg | ORAL_TABLET | Freq: Once | ORAL | Status: AC
  Administered 2015-02-05: 50 mg via ORAL
  Filled 2015-02-05: qty 1

## 2015-02-05 MED ORDER — BUTALBITAL-APAP-CAFFEINE 50-325-40 MG PO TABS
2.0000 | ORAL_TABLET | Freq: Once | ORAL | Status: AC
  Administered 2015-02-05: 2 via ORAL
  Filled 2015-02-05: qty 2

## 2015-02-05 NOTE — MAU Note (Addendum)
Having some severe headaches, they come with flutters in her eyes, feet get swelled up and has lower back pain and dizziness.  (was seen at her doctor in [place] on Tues, they said everything was normal for her)

## 2015-02-05 NOTE — Discharge Instructions (Signed)

## 2015-02-05 NOTE — MAU Provider Note (Signed)
History     CSN: [id number]  Arrival date and time: 02/05/15 1828   None     Chief Complaint  Patient presents with  . Headache  . Dizziness  . Back Pain   HPI [name] is a 19yo [name] @ 27.6wks who presents for eval of H/A despite Tylenol and low back discomfort. No s/s preeclampsia. Denies dysuria, fever, vag bldg or leaking. Her preg has been followed by a practice in [place] and has been remarkable for 1) depression- on Zoloft 2) kidney stones with last preg.  OB History    Gravida Para Term Preterm AB TAB SAB Ectopic Multiple Living   3 2 1 1  0 0 0 0 0 2      Past Medical History  Diagnosis Date  . Incisional hernia 04/2013  . Depression   . Chronic kidney disease     kidney stones and kidney infection with last pregnancy    Past Surgical History  Procedure Laterality Date  . Wisdom tooth extraction    . Ercp  03/12/2012    Procedure: ENDOSCOPIC RETROGRADE CHOLANGIOPANCREATOGRAPHY (ERCP);  Surgeon: Petra KubaMarc E Magod, MD;  [facility];  Service: Endoscopy;  Laterality: N/A;  . Cholecystectomy  04/03/2012    Procedure: LAPAROSCOPIC CHOLECYSTECTOMY WITH INTRAOPERATIVE CHOLANGIOGRAM;  Surgeon: Velora Hecklerodd M Gerkin, MD;  Location: [facility];  Service: General;  Laterality: N/A;  . Inguinal hernia repair N/A 05/15/2013    Procedure: HERNIA REPAIR INCISIONAL;  Surgeon: Velora Hecklerodd M Gerkin, MD;  Location: [facility];  Service: General;  Laterality: N/A;  . Insertion of mesh N/A 05/15/2013    Procedure: INSERTION OF MESH;  Surgeon: Velora Hecklerodd M Gerkin, MD;  Location: [facility];  Service: General;  Laterality: N/A;    Family History  Problem Relation Age of Onset  . Hypertension Mother   . Diabetes Paternal Grandmother   . Cancer Paternal Grandfather   . Seizures Paternal Grandfather   . Hypertension Father     History  Substance Use Topics  . Smoking status: Never Smoker   . Smokeless tobacco: Never Used  . Alcohol Use: Yes     Comment: last drink 2 yrs ago     Allergies:  Allergies  Allergen Reactions  . Penicillins Rash    Prescriptions prior to admission  Medication Sig Dispense Refill Last Dose  . acetaminophen (TYLENOL) 500 MG tablet Take 1,000 mg by mouth every 6 (six) hours as needed.   02/05/2015 at 0800  . Prenatal Vit-Fe Fumarate-FA (PRENATAL MULTIVITAMIN) TABS tablet Take 1 tablet by mouth daily at 12 noon.   Past Week at Unknown time  . sertraline (ZOLOFT) 50 MG tablet Take 50 mg by mouth daily.   02/05/2015 at Unknown time  . ibuprofen (ADVIL,MOTRIN) 800 MG tablet Take 1 tablet (800 mg total) by mouth every 8 (eight) hours as needed. (Patient not taking: Reported on 12/29/2014) 60 tablet 0 Not Taking at Unknown time    ROS Physical Exam   Blood pressure 130/66, pulse 107, temperature 98.1 F (36.7 C), temperature source Oral, resp. rate 18, height 5' 2.5" (1.588 m), weight 88.905 kg (196 lb), last menstrual period 07/25/2014, currently breastfeeding.  Physical Exam  Constitutional: She is oriented to person, place, and time. She appears well-developed.  HENT:  Head: Normocephalic.  Cardiovascular:  Sl tachycardic  Respiratory: Effort normal.  GI:  EFM 120s, +accels, no decels Some irritability, no ctx  Musculoskeletal: Normal range of motion.  Neurological: She is alert and oriented  to person, place, and time.  Skin: Skin is warm and dry.  Psychiatric: She has a normal mood and affect. Her behavior is normal. Thought content normal.   Urinalysis    Component Value Date/Time   COLORURINE YELLOW 02/05/2015 1855   APPEARANCEUR CLEAR 02/05/2015 1855   LABSPEC 1.015 02/05/2015 1855   PHURINE 7.0 02/05/2015 1855   GLUCOSEU NEGATIVE 02/05/2015 1855   HGBUR NEGATIVE 02/05/2015 1855   BILIRUBINUR NEGATIVE 02/05/2015 1855   KETONESUR NEGATIVE 02/05/2015 1855   PROTEINUR NEGATIVE 02/05/2015 1855   UROBILINOGEN 0.2 02/05/2015 1855   NITRITE NEGATIVE 02/05/2015 1855   LEUKOCYTESUR SMALL* 02/05/2015 1855     MAU Course   Procedures  Given Fioricet x 2, then Ultram - now has relief from H/A.  Assessment and Plan  IUP@27 .6wks Headache in preg  D/C home F/U as scheduled w/ OB in [facility], [name] CNM 02/05/2015, 7:54 PM

## 2015-11-03 ENCOUNTER — Encounter (HOSPITAL_COMMUNITY): Payer: Self-pay | Admitting: *Deleted

## 2016-05-30 ENCOUNTER — Encounter (HOSPITAL_COMMUNITY): Payer: Self-pay | Admitting: Emergency Medicine

## 2016-05-30 DIAGNOSIS — Z5321 Procedure and treatment not carried out due to patient leaving prior to being seen by health care provider: Secondary | ICD-10-CM | POA: Insufficient documentation

## 2016-05-30 DIAGNOSIS — N189 Chronic kidney disease, unspecified: Secondary | ICD-10-CM | POA: Insufficient documentation

## 2016-05-30 DIAGNOSIS — G43909 Migraine, unspecified, not intractable, without status migrainosus: Secondary | ICD-10-CM | POA: Insufficient documentation
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
## 2016-05-31 ENCOUNTER — Emergency Department (HOSPITAL_COMMUNITY)
Admission: EM | Admit: 2016-05-31 | Discharge: 2016-05-31 | Disposition: A | Discharge status: Home / Self Care | Payer: Medicaid Other | Attending: Dermatology | Admitting: Dermatology

## 2016-07-31 ENCOUNTER — Encounter (HOSPITAL_COMMUNITY): Payer: Self-pay

## 2016-07-31 ENCOUNTER — Emergency Department (HOSPITAL_COMMUNITY)
Admission: EM | Admit: 2016-07-31 | Discharge: 2016-08-01 | Disposition: A | Discharge status: Home / Self Care | Payer: Medicaid Other | Attending: Dermatology | Admitting: Dermatology

## 2016-07-31 DIAGNOSIS — N189 Chronic kidney disease, unspecified: Secondary | ICD-10-CM | POA: Insufficient documentation

## 2016-07-31 DIAGNOSIS — R110 Nausea: Secondary | ICD-10-CM | POA: Insufficient documentation

## 2016-07-31 DIAGNOSIS — R51 Headache: Secondary | ICD-10-CM | POA: Diagnosis not present

## 2016-07-31 DIAGNOSIS — M545 Low back pain: Secondary | ICD-10-CM | POA: Diagnosis not present

## 2016-07-31 DIAGNOSIS — Z5321 Procedure and treatment not carried out due to patient leaving prior to being seen by health care provider: Secondary | ICD-10-CM | POA: Insufficient documentation

## 2016-07-31 NOTE — ED Notes (Signed)
Pt called for in waiting area for vital signs X2.

## 2016-07-31 NOTE — ED Triage Notes (Signed)
Pt also complaining of recurrent headaches. Pt states some nausea with onset of headaches. Denies any abdominal pain/urinary symptoms.

## 2016-07-31 NOTE — ED Triage Notes (Signed)
Pt complaining of low mid back pain. Pt denies any radiation, denies any injury/trauma.

## 2016-08-01 NOTE — ED Notes (Addendum)
Pt called for in waiting area for vital signs no answer no response.x3

## 2016-10-23 DIAGNOSIS — A549 Gonococcal infection, unspecified: Secondary | ICD-10-CM

## 2016-10-23 HISTORY — DX: Gonococcal infection, unspecified: A54.9

## 2017-06-16 ENCOUNTER — Encounter (HOSPITAL_COMMUNITY): Payer: Self-pay | Admitting: *Deleted

## 2017-06-16 ENCOUNTER — Inpatient Hospital Stay (HOSPITAL_COMMUNITY)
Admission: AD | Admit: 2017-06-16 | Discharge: 2017-06-16 | Disposition: A | Discharge status: Home / Self Care | Payer: Medicaid Other | Source: Ambulatory Visit | Attending: Obstetrics and Gynecology | Admitting: Obstetrics and Gynecology

## 2017-06-16 ENCOUNTER — Inpatient Hospital Stay (HOSPITAL_COMMUNITY): Payer: Medicaid Other

## 2017-06-16 DIAGNOSIS — R109 Unspecified abdominal pain: Secondary | ICD-10-CM

## 2017-06-16 DIAGNOSIS — Z87442 Personal history of urinary calculi: Secondary | ICD-10-CM | POA: Diagnosis not present

## 2017-06-16 DIAGNOSIS — N189 Chronic kidney disease, unspecified: Secondary | ICD-10-CM | POA: Diagnosis not present

## 2017-06-16 DIAGNOSIS — R102 Pelvic and perineal pain: Secondary | ICD-10-CM

## 2017-06-16 DIAGNOSIS — O209 Hemorrhage in early pregnancy, unspecified: Secondary | ICD-10-CM | POA: Insufficient documentation

## 2017-06-16 DIAGNOSIS — F329 Major depressive disorder, single episode, unspecified: Secondary | ICD-10-CM | POA: Insufficient documentation

## 2017-06-16 DIAGNOSIS — O26831 Pregnancy related renal disease, first trimester: Secondary | ICD-10-CM | POA: Diagnosis not present

## 2017-06-16 DIAGNOSIS — O26891 Other specified pregnancy related conditions, first trimester: Secondary | ICD-10-CM | POA: Diagnosis not present

## 2017-06-16 DIAGNOSIS — Z880 Allergy status to penicillin: Secondary | ICD-10-CM | POA: Insufficient documentation

## 2017-06-16 DIAGNOSIS — Z3490 Encounter for supervision of normal pregnancy, unspecified, unspecified trimester: Secondary | ICD-10-CM

## 2017-06-16 DIAGNOSIS — O99341 Other mental disorders complicating pregnancy, first trimester: Secondary | ICD-10-CM | POA: Insufficient documentation

## 2017-06-16 DIAGNOSIS — Z3A01 Less than 8 weeks gestation of pregnancy: Secondary | ICD-10-CM | POA: Insufficient documentation

## 2017-06-16 LAB — POCT PREGNANCY, URINE: Preg Test, Ur: POSITIVE — AB

## 2017-06-16 LAB — URINALYSIS, ROUTINE W REFLEX MICROSCOPIC
Bilirubin Urine: NEGATIVE
GLUCOSE, UA: NEGATIVE mg/dL
KETONES UR: 20 mg/dL — AB
NITRITE: NEGATIVE
Protein, ur: 30 mg/dL — AB
Specific Gravity, Urine: 1.027 (ref 1.005–1.030)
pH: 6 (ref 5.0–8.0)

## 2017-06-16 LAB — HCG, QUANTITATIVE, PREGNANCY: hCG, Beta Chain, Quant, S: 43751 m[IU]/mL — ABNORMAL HIGH (ref <5)

## 2017-06-16 NOTE — Discharge Instructions (Signed)

## 2017-06-16 NOTE — MAU Provider Note (Signed)
History   [id number] @ apx 7 wks per LMP in with c/o abd pain. States had some spotting yesterday but none today.  CSN: [id number]  Arrival date & time 06/16/17  1108   None     Chief Complaint  Patient presents with  . Abdominal Cramping  . Vaginal Bleeding    HPI  Past Medical History:  Diagnosis Date  . Chronic kidney disease    kidney stones and kidney infection with last pregnancy  . Depression   . Incisional hernia 04/2013    Past Surgical History:  Procedure Laterality Date  . CHOLECYSTECTOMY  04/03/2012   Procedure: LAPAROSCOPIC CHOLECYSTECTOMY WITH INTRAOPERATIVE CHOLANGIOGRAM;  Surgeon: Velora Heckler, MD;  Location: [facility];  Service: General;  Laterality: N/A;  . ERCP  03/12/2012   Procedure: ENDOSCOPIC RETROGRADE CHOLANGIOPANCREATOGRAPHY (ERCP);  Surgeon: Petra Kuba, MD;  Location: [facility] OR;  Service: Endoscopy;  Laterality: N/A;  . INGUINAL HERNIA REPAIR N/A 05/15/2013   Procedure: HERNIA REPAIR INCISIONAL;  Surgeon: Velora Heckler, MD;  Location: [facility];  Service: General;  Laterality: N/A;  . INSERTION OF MESH N/A 05/15/2013   Procedure: INSERTION OF MESH;  Surgeon: Velora Heckler, MD;  Location: [facility];  Service: General;  Laterality: N/A;  . WISDOM TOOTH EXTRACTION      Family History  Problem Relation Age of Onset  . Hypertension Mother   . Hypertension Father   . Diabetes Paternal Grandmother   . Cancer Paternal Grandfather   . Seizures Paternal Grandfather     Social History  Substance Use Topics  . Smoking status: Never Smoker  . Smokeless tobacco: Never Used  . Alcohol use Yes     Comment: last drink 2 yrs ago    OB History    Gravida Para Term Preterm AB Living   4 3 1 2  0 2   SAB TAB Ectopic Multiple Live Births   0 0 0 0 2      Review of Systems  Constitutional: Negative.   HENT: Negative.   Eyes: Negative.   Respiratory: Negative.   Cardiovascular: Negative.   Gastrointestinal: Positive for  abdominal pain.  Endocrine: Negative.   Genitourinary: Negative.   Musculoskeletal: Negative.   Skin: Negative.   Allergic/Immunologic: Negative.   Neurological: Negative.   Hematological: Negative.   Psychiatric/Behavioral: Negative.     Allergies  Penicillins  Home Medications    BP 122/66 (BP Location: Left Arm)   Pulse 86   Temp 98.4 F (36.9 C) (Oral)   Resp 17   Ht 5\' 4"  (1.626 m)   Wt 207 lb (93.9 kg)   LMP 04/28/2017 (Approximate)   SpO2 98%   BMI 35.53 kg/m   Physical Exam  Constitutional: She is oriented to person, place, and time. She appears well-developed and well-nourished.  HENT:  Head: Normocephalic.  Eyes: Pupils are equal, round, and reactive to light.  Neck: Normal range of motion.  Cardiovascular: Normal rate, regular rhythm, normal heart sounds and intact distal pulses.   Pulmonary/Chest: Effort normal and breath sounds normal.  Abdominal: Soft. Bowel sounds are normal.  Genitourinary: Vagina normal and uterus normal.  Musculoskeletal: Normal range of motion.  Neurological: She is alert and oriented to person, place, and time. She has normal reflexes.  Skin: Skin is warm and dry.  Psychiatric: She has a normal mood and affect. Her behavior is normal. Judgment and thought content normal.    MAU Course  Procedures (including critical  care time)  Labs Reviewed  POCT PREGNANCY, URINE - Abnormal; Notable for the following:       Result Value   Preg Test, Ur POSITIVE (*)    All other components within normal limits  URINALYSIS, ROUTINE W REFLEX MICROSCOPIC  HCG, QUANTITATIVE, PREGNANCY   No results found.   1. Abdominal pain in pregnancy, first trimester       MDM  VSS, no vag bleeding. Exam WNL. Will get quant and [place] to assess pain. US show viable IUP at 6.1 wks. Will d/c home

## 2017-06-16 NOTE — MAU Note (Signed)
Patient c/o sharp intermittent lower abdominal pain and vaginal spotting since yesterday.  States she is not bleeding now.  Pain 6/10.  Also c/o HA pain 7/10 unrelieved by tylenol.  Denies intercourse in past week.  Denies vaginal discharge.  Took +HPT not confirmed by dr office yet.

## 2017-10-23 DIAGNOSIS — R87612 Low grade squamous intraepithelial lesion on cytologic smear of cervix (LGSIL): Secondary | ICD-10-CM

## 2017-10-23 HISTORY — DX: Low grade squamous intraepithelial lesion on cytologic smear of cervix (LGSIL): R87.612

## 2017-12-28 ENCOUNTER — Inpatient Hospital Stay (HOSPITAL_COMMUNITY)
Admission: AD | Admit: 2017-12-28 | Discharge: 2017-12-28 | Disposition: A | Discharge status: Home / Self Care | Payer: Medicaid Other | Source: Ambulatory Visit | Attending: Obstetrics & Gynecology | Admitting: Obstetrics & Gynecology

## 2017-12-28 DIAGNOSIS — Z79899 Other long term (current) drug therapy: Secondary | ICD-10-CM | POA: Diagnosis not present

## 2017-12-28 DIAGNOSIS — O26893 Other specified pregnancy related conditions, third trimester: Secondary | ICD-10-CM

## 2017-12-28 DIAGNOSIS — O99343 Other mental disorders complicating pregnancy, third trimester: Secondary | ICD-10-CM | POA: Insufficient documentation

## 2017-12-28 DIAGNOSIS — Z3A35 35 weeks gestation of pregnancy: Secondary | ICD-10-CM

## 2017-12-28 DIAGNOSIS — O479 False labor, unspecified: Secondary | ICD-10-CM

## 2017-12-28 DIAGNOSIS — Z3A34 34 weeks gestation of pregnancy: Secondary | ICD-10-CM | POA: Insufficient documentation

## 2017-12-28 DIAGNOSIS — R102 Pelvic and perineal pain: Secondary | ICD-10-CM | POA: Insufficient documentation

## 2017-12-28 DIAGNOSIS — O4703 False labor before 37 completed weeks of gestation, third trimester: Secondary | ICD-10-CM | POA: Diagnosis not present

## 2017-12-28 DIAGNOSIS — F329 Major depressive disorder, single episode, unspecified: Secondary | ICD-10-CM | POA: Insufficient documentation

## 2017-12-28 LAB — URINALYSIS, ROUTINE W REFLEX MICROSCOPIC
Bilirubin Urine: NEGATIVE
Glucose, UA: NEGATIVE mg/dL
Hgb urine dipstick: NEGATIVE
Ketones, ur: 20 mg/dL — AB
Nitrite: NEGATIVE
Protein, ur: NEGATIVE mg/dL
Specific Gravity, Urine: 1.014 (ref 1.005–1.030)
pH: 6 (ref 5.0–8.0)

## 2017-12-28 LAB — FETAL FIBRONECTIN: Fetal Fibronectin: NEGATIVE

## 2017-12-28 NOTE — MAU Note (Deleted)
I have communicated with Steward DroneVeronica Rogers, CNM and reviewed vital signs:  Vitals:   12/28/17 2151 12/28/17 2313  BP: 128/76 130/76  Pulse: 82 81  Resp:  18  Temp:  98 F (36.7 C)  SpO2: (!) 89%     Vaginal exam:  Dilation: 1 Effacement (%): Thick Cervical Position: Posterior Exam by:: V Rogers CNM,   Also reviewed contraction pattern and that non-stress test is reactive.  It has been documented that patient is contracting every irregularly with  no cervical change over 1 hour not indicating active labor.  Patient denies any other complaints.  Based on this report provider has given order for discharge.  A discharge order and diagnosis entered by a provider.   Labor discharge instructions reviewed with patient.

## 2017-12-28 NOTE — MAU Note (Signed)
Contractions since Monday. [facility]. Thinks cervix closed. Has vaginal cyst so sve can be difficult. Some pelvic pain, esp when walks. "Feels like my pelvic area is gonna break"

## 2017-12-28 NOTE — Progress Notes (Signed)
Written and verbal d/c instructions given and understanding voiced. 

## 2017-12-28 NOTE — MAU Note (Signed)
Pt has been having cntrx and pelvic pressure since Monday. [facility] Thinks her cervix was closed.  Has vaginal cyst so feels it's hard to get a vag exam.  Denies fluid bleeding or lof. +FM.

## 2017-12-28 NOTE — Discharge Instructions (Signed)
Braxton Hicks Contractions °Contractions of the uterus can occur throughout pregnancy, but they are not always a sign that you are in labor. You may have practice contractions called Braxton Hicks contractions. These false labor contractions are sometimes confused with true labor. °What are Braxton Hicks contractions? °Braxton Hicks contractions are tightening movements that occur in the muscles of the uterus before labor. Unlike true labor contractions, these contractions do not result in opening (dilation) and thinning of the cervix. Toward the end of pregnancy (32-34 weeks), Braxton Hicks contractions can happen more often and may become stronger. These contractions are sometimes difficult to tell apart from true labor because they can be very uncomfortable. You should not feel embarrassed if you go to the hospital with false labor. °Sometimes, the only way to tell if you are in true labor is for your health care provider to look for changes in the cervix. The health care provider will do a physical exam and may monitor your contractions. If you are not in true labor, the exam should show that your cervix is not dilating and your water has not broken. °If there are other health problems associated with your pregnancy, it is completely safe for you to be sent home with false labor. You may continue to have Braxton Hicks contractions until you go into true labor. °How to tell the difference between true labor and false labor °True labor °· Contractions last 30-70 seconds. °· Contractions become very regular. °· Discomfort is usually felt in the top of the uterus, and it spreads to the lower abdomen and low back. °· Contractions do not go away with walking. °· Contractions usually become more intense and increase in frequency. °· The cervix dilates and gets thinner. °False labor °· Contractions are usually shorter and not as strong as true labor contractions. °· Contractions are usually irregular. °· Contractions  are often felt in the front of the lower abdomen and in the groin. °· Contractions may go away when you walk around or change positions while lying down. °· Contractions get weaker and are shorter-lasting as time goes on. °· The cervix usually does not dilate or become thin. °Follow these instructions at home: °· Take over-the-counter and prescription medicines only as told by your health care provider. °· Keep up with your usual exercises and follow other instructions from your health care provider. °· Eat and drink lightly if you think you are going into labor. °· If Braxton Hicks contractions are making you uncomfortable: °? Change your position from lying down or resting to walking, or change from walking to resting. °? Sit and rest in a tub of warm water. °? Drink enough fluid to keep your urine pale yellow. Dehydration may cause these contractions. °? Do slow and deep breathing several times an hour. °· Keep all follow-up prenatal visits as told by your health care provider. This is important. °Contact a health care provider if: °· You have a fever. °· You have continuous pain in your abdomen. °Get help right away if: °· Your contractions become stronger, more regular, and closer together. °· You have fluid leaking or gushing from your vagina. °· You pass blood-tinged mucus (bloody show). °· You have bleeding from your vagina. °· You have low back pain that you never had before. °· You feel your baby’s head pushing down and causing pelvic pressure. °· Your baby is not moving inside you as much as it used to. °Summary °· Contractions that occur before labor are called Braxton   Hicks contractions, false labor, or practice contractions. °· Braxton Hicks contractions are usually shorter, weaker, farther apart, and less regular than true labor contractions. True labor contractions usually become progressively stronger and regular and they become more frequent. °· Manage discomfort from Braxton Hicks contractions by  changing position, resting in a warm bath, drinking plenty of water, or practicing deep breathing. °This information is not intended to replace advice given to you by your health care provider. Make sure you discuss any questions you have with your health care provider. °Document Released: 02/22/2017 Document Revised: 02/22/2017 Document Reviewed: 02/22/2017 °Elsevier Interactive Patient Education © 2018 [employer] ° °

## 2017-12-28 NOTE — MAU Provider Note (Signed)
Chief Complaint:  Contractions and Pelvic Pain   First Provider Initiated Contact with Patient 12/28/17 2155      HPI: [name] is a 22 y.o. [id number] at [id number] presents to maternity admissions reporting contractions and pelvic pain. She reports being seen for ongoing prenatal care yesterday at [facility] in [place]. She reports having contractions on and off since Monday, [facility], appointment yesterday and presents here today for same concerns. She reports contractions occur every 10-15 minutes, states she has some painful contractions when she lays down at night and those are every 3-4 minutes apart. She reports being checked in the office yesterday and on Monday, where she was closed. She believes they were checking the wrong location. She reports having a "vaginal cyst to the left side of her vagina and that is what they were checking". She reports good fetal movement, denies LOF, vaginal bleeding, vaginal itching/burning, urinary symptoms, h/a, dizziness, n/v, or fever/chills. She denies recent IC. Has next prenatal appointment scheduled for 01/10/18.   Past Medical History: Past Medical History:  Diagnosis Date  . Chronic kidney disease    kidney stones and kidney infection with last pregnancy  . Depression   . Incisional hernia 04/2013    Past obstetric history: OB History  Gravida Para Term Preterm AB Living  4 3 1 2  0 2  SAB TAB Ectopic Multiple Live Births  0 0 0 0 2    # Outcome Date GA Lbr Len/2nd Weight Sex Delivery Anes PTL Lv  4 Current           3 Term 04/13/14 8669w1d   M Vag-Spont   LIV     Birth Comments: System Generated. Please review and update pregnancy details.  2 Preterm 02/20/12 1856w5d 04:42 / 00:15 6 lb 11.2 oz (3.04 kg) M Vag-Spont EPI  LIV  1 Preterm      Vag-Spont         Past Surgical History: Past Surgical History:  Procedure Laterality Date  . CHOLECYSTECTOMY  04/03/2012   Procedure: LAPAROSCOPIC  CHOLECYSTECTOMY WITH INTRAOPERATIVE CHOLANGIOGRAM;  Surgeon: Velora Hecklerodd M Gerkin, MD;  Location: [facility];  Service: General;  Laterality: N/A;  . ERCP  03/12/2012   Procedure: ENDOSCOPIC RETROGRADE CHOLANGIOPANCREATOGRAPHY (ERCP);  Surgeon: Petra KubaMarc E Magod, MD;  [facility];  Service: Endoscopy;  Laterality: N/A;  . INGUINAL HERNIA REPAIR N/A 05/15/2013   Procedure: HERNIA REPAIR INCISIONAL;  Surgeon: Velora Hecklerodd M Gerkin, MD;  Location: [facility];  Service: General;  Laterality: N/A;  . INSERTION OF MESH N/A 05/15/2013   Procedure: INSERTION OF MESH;  Surgeon: Velora Hecklerodd M Gerkin, MD;  Location: [facility];  Service: General;  Laterality: N/A;  . WISDOM TOOTH EXTRACTION      Family History: Family History  Problem Relation Age of Onset  . Hypertension Mother   . Hypertension Father   . Diabetes Paternal Grandmother   . Cancer Paternal Grandfather   . Seizures Paternal Grandfather     Social History: Social History   Tobacco Use  . Smoking status: Never Smoker  . Smokeless tobacco: Never Used  Substance Use Topics  . Alcohol use: Yes    Comment: last drink 2 yrs ago  . Drug use: No    Allergies:  Allergies  Allergen Reactions  . Penicillins Anaphylaxis, Itching, Rash and Other (See Comments)    Has patient had a PCN reaction causing immediate rash, facial/tongue/throat swelling, SOB  or lightheadedness with hypotension: Yes Has patient had a PCN reaction causing severe rash involving mucus membranes or skin necrosis: No Has patient had a PCN reaction that required hospitalization: No Has patient had a PCN reaction occurring within the last 10 years: No If all of the above answers are "NO", then may proceed with Cephalosporin use.  And swelling    Meds:  Medications Prior to Admission  Medication Sig Dispense Refill Last Dose  . acetaminophen (TYLENOL) 500 MG tablet Take 1,000 mg by mouth every 6 (six) hours as needed for mild pain, moderate pain, fever or  headache.    Past Week at Unknown time  . citalopram (CELEXA) 20 MG tablet Take 1/2 tablet daily for one week then take 1 tablet daily   12/27/2017 at Unknown time    ROS:  Review of Systems  Constitutional: Negative.   Respiratory: Negative.   Cardiovascular: Negative.   Gastrointestinal: Positive for abdominal pain. Negative for constipation, diarrhea, nausea and vomiting.       Contractions  Genitourinary: Positive for pelvic pain. Negative for difficulty urinating, dysuria, frequency, urgency, vaginal bleeding and vaginal discharge.  Musculoskeletal: Negative.   Neurological: Negative.   Psychiatric/Behavioral: Negative.    I have reviewed patient's Past Medical Hx, Surgical Hx, Family Hx, Social Hx, medications and allergies.   Physical Exam   Patient Vitals for the past 24 hrs:  BP Temp Pulse Resp SpO2 Height Weight  12/28/17 2313 130/76 98 F (36.7 C) 81 18 - - -  12/28/17 2151 128/76 - 82 - (!) 89 % - -  12/28/17 2051 123/70 97.7 F (36.5 C) (!) 112 18 - 5\' 5"  (1.651 m) 205 lb (93 kg)   Constitutional: Well-developed, well-nourished female in no acute distress.  Cardiovascular: normal rate Respiratory: normal effort GI: Abd soft, non-tender, gravid appropriate for gestational age.  MS: Extremities nontender, no edema, normal ROM Neurologic: Alert and oriented x 4.  GU: Neg CVAT.  CERVICAL EXAM: no cervical change after reassessment  Dilation: 1 Effacement (%): Thick Cervical Position: Posterior Exam by:: Lanice Shirts CNM  FHT:  Baseline 130 , moderate variability, accelerations present, no decelerations Contractions: 3 contractions noted on monitor over 1 hour with UI.    Labs: Results for orders placed or performed during the hospital encounter of 12/28/17 (from the past 24 hour(s))  Urinalysis, Routine w reflex microscopic     Status: Abnormal   Collection Time: 12/28/17  8:55 PM  Result Value Ref Range   Color, Urine YELLOW YELLOW   APPearance HAZY (A) CLEAR    Specific Gravity, Urine 1.014 1.005 - 1.030   pH 6.0 5.0 - 8.0   Glucose, UA NEGATIVE NEGATIVE mg/dL   Hgb urine dipstick NEGATIVE NEGATIVE   Bilirubin Urine NEGATIVE NEGATIVE   Ketones, ur 20 (A) NEGATIVE mg/dL   Protein, ur NEGATIVE NEGATIVE mg/dL   Nitrite NEGATIVE NEGATIVE   Leukocytes, UA SMALL (A) NEGATIVE   RBC / HPF 0-5 0 - 5 RBC/hpf   WBC, UA 6-30 0 - 5 WBC/hpf   Bacteria, UA RARE (A) NONE SEEN   Squamous Epithelial / LPF 0-5 (A) NONE SEEN   Mucus PRESENT   Fetal fibronectin     Status: None   Collection Time: 12/28/17 10:15 PM  Result Value Ref Range   Fetal Fibronectin NEGATIVE NEGATIVE    MAU Course/MDM: Orders Placed This Encounter  Procedures  . Urinalysis, Routine w reflex microscopic  . Fetal fibronectin  UA- 20 ketones present, encouraged oral  hydration and food  FFN- negative  NST reviewed- reactive Treatments in MAU included 1 pitcher of water orally.   Pt discharge with strict PTL precautions.  Today's evaluation included a work-up for preterm labor which can be life-threatening for both mom and baby.  Assessment: 1. Braxton Hicks contractions   2. Pelvic pain affecting pregnancy in third trimester, antepartum    Plan: Discharge home. Pt stable at time of discharge. Preterm Labor precautions and fetal kick counts Follow up as scheduled for prenatal appointments  Encouraged to seek care at [facility] for labor evaluation or emergencies, due to her provider seen for prenatal appointments delivers at [facility]  Allergies as of 12/28/2017      Reactions   Penicillins Anaphylaxis, Itching, Rash, Other (See Comments)   Has patient had a PCN reaction causing immediate rash, facial/tongue/throat swelling, SOB or lightheadedness with hypotension: Yes Has patient had a PCN reaction causing severe rash involving mucus membranes or skin necrosis: No Has patient had a PCN reaction that required hospitalization: No Has patient had a PCN reaction occurring within  the last 10 years: No If all of the above answers are "NO", then may proceed with Cephalosporin use.  And swelling      Medication List    TAKE these medications   acetaminophen 500 MG tablet Commonly known as:  TYLENOL Take 1,000 mg by mouth every 6 (six) hours as needed for mild pain, moderate pain, fever or headache.   citalopram 20 MG tablet Commonly known as:  CELEXA Take 1/2 tablet daily for one week then take 1 tablet daily      Steward Drone Certified Nurse-Midwife 12/28/2017 11:49 PM

## 2018-04-27 ENCOUNTER — Encounter (HOSPITAL_COMMUNITY): Payer: Self-pay

## 2018-06-16 ENCOUNTER — Encounter (HOSPITAL_COMMUNITY): Payer: Self-pay

## 2018-06-16 ENCOUNTER — Emergency Department (HOSPITAL_COMMUNITY)
Admission: EM | Admit: 2018-06-16 | Discharge: 2018-06-16 | Disposition: A | Discharge status: Home / Self Care | Payer: Self-pay | Attending: Emergency Medicine | Admitting: Emergency Medicine

## 2018-06-16 DIAGNOSIS — B029 Zoster without complications: Secondary | ICD-10-CM | POA: Insufficient documentation

## 2018-06-16 DIAGNOSIS — Z79899 Other long term (current) drug therapy: Secondary | ICD-10-CM | POA: Insufficient documentation

## 2018-06-16 MED ORDER — ACYCLOVIR 400 MG PO TABS: 800.0000 mg | ORAL_TABLET | Freq: Every day | ORAL | 0 refills | Status: AC

## 2018-06-16 NOTE — ED Triage Notes (Signed)
Pt presents for evaluation of rash to area of under R breast going around to R back. Pt reports itching. States first noticed 3-4 days ago.

## 2018-06-16 NOTE — ED Notes (Signed)
Patient verbalizes understanding of discharge instructions. Opportunity for questioning and answers were provided. Armband removed by staff, pt discharged from ED ambulatory.   

## 2018-06-16 NOTE — Discharge Instructions (Addendum)
Your rash is consistent with shingles. Please see attached handout on this.  Please take medicatoin as prescirbed.  Please do not breast feed while taking this medication.  Please know this rash can be transmitted to those who have not been immunized. Care should be taken.  Follow up with your pcp in 1 week.  Take benadryl as needed for itching. This can make you sleepy.  If you develop worsening or new concerning symptoms you can return to the emergency department for re-evaluation.

## 2018-06-16 NOTE — ED Provider Notes (Signed)
[facility] EMERGENCY [facility]PART[facility] Provider Note   CSN: [id number] Arrival date & time: 06/16/18  1221     History   Chief Complaint Chief Complaint  Patient presents with  . Rash    HPI [place]ittany D Sanchez is a 22 y.o. female who presents to the emergency department today for rash.  Patient reports that she began having a pruritic, painful, burning-like rash that started underneath her right breast and has radiated to her right flank.  She states this does not cross midline.  She states this occasionally has a tingling sensation with it but no numbness.  She has tried over-the-counter hydrocortisone for symptoms without any relief.  She states that nothing makes her symptoms better or worse.  She has noted small areas of blisters that have been draining. Denies fever, chills, contacts with persons with similar rash, or any changes in lotions/soaps/detergents, exposure to animal or plant irritants, and denies swelling or purulent discharge. No new medications. No recent travel. No recent tick bites. No involvement to palms/soles or between webspaces.  Denies visual changes or facial involvement.  HPI  Past Medical History:  Diagnosis Date  . Chronic kidney disease    kidney stones and kidney infection with last pregnancy  . Depression   . Incisional hernia 04/2013    Patient Active Problem List   Diagnosis Date Noted  . Incisional hernia 10/30/2012    Past Surgical History:  Procedure Laterality Date  . CHOLECYSTECTOMY  04/03/2012   Procedure: LAPAROSCOPIC CHOLECYSTECTOMY WITH INTRAOPERATIVE CHOLANGIOGRAM;  Surgeon: Velora Hecklerodd M Gerkin, MD;  Location: [facility];  Service: General;  Laterality: N/A;  . ERCP  03/12/2012   Procedure: ENDOSCOPIC RETROGRADE CHOLANGIOPANCREATOGRAPHY (ERCP);  Surgeon: Petra KubaMarc E Magod, MD;  [facility];  Service: Endoscopy;  Laterality: N/A;  . INGUINAL HERNIA REPAIR N/A 05/15/2013   Procedure: HERNIA REPAIR INCISIONAL;  Surgeon: Velora Hecklerodd M Gerkin,  MD;  Location: [facility];  Service: General;  Laterality: N/A;  . INSERTION OF MESH N/A 05/15/2013   Procedure: INSERTION OF MESH;  Surgeon: Velora Hecklerodd M Gerkin, MD;  Location: [facility];  Service: General;  Laterality: N/A;  . WISDOM TOOTH EXTRACTION       OB History    Gravida  4   Para  3   Term  1   Preterm  2   AB  0   Living  2     SAB  0   TAB  0   Ectopic  0   Multiple  0   Live Births  2            Home Medications    Prior to Admission medications   Medication Sig Start Date End Date Taking? Authorizing Provider  acetaminophen (TYLENOL) 500 MG tablet Take 1,000 mg by mouth every 6 (six) hours as needed for mild pain, moderate pain, fever or headache.     [provider]  citalopram (CELEXA) 20 MG tablet Take 1/2 tablet daily for one week then take 1 tablet daily 12/20/17   [provider]    Family History Family History  Problem Relation Age of Onset  . Hypertension Mother   . Hypertension Father   . Diabetes Paternal Grandmother   . Cancer Paternal Grandfather   . Seizures Paternal Grandfather     Social History Social History   Tobacco Use  . Smoking status: Never Smoker  . Smokeless tobacco: Never Used  Substance Use Topics  . Alcohol  use: Yes    Comment: last drink 2 yrs ago  . Drug use: No     Allergies   Penicillins   Review of Systems Review of Systems  Constitutional: Negative for fever.  Eyes: Negative for pain and visual disturbance.  Respiratory: Negative for shortness of breath.   Gastrointestinal: Negative for nausea and vomiting.  Musculoskeletal: Negative for arthralgias.  Skin: Positive for rash.  Neurological: Negative for weakness and numbness.  All other systems reviewed and are negative.    Physical Exam Updated Vital Signs BP 114/71 (BP Location: Right Arm)   Pulse 88   Temp 98.7 F (37.1 C) (Oral)   Resp 20   LMP 05/26/2018 (Approximate)   SpO2 99%    Physical Exam  Constitutional: She appears well-developed and well-nourished.  HENT:  Head: Normocephalic and atraumatic.  Right Ear: External ear normal.  Left Ear: External ear normal.  Nose: Nose normal.  Mouth/Throat: Uvula is midline, oropharynx is clear and moist and mucous membranes are normal. No tonsillar exudate.  Eyes: Pupils are equal, round, and reactive to light. Right eye exhibits no discharge. Left eye exhibits no discharge. No scleral icterus.  No rash of the face.  Normal extraocular movements.  No scleral or conjunctival injection  Neck: Trachea normal. Neck supple. No spinous process tenderness present. No neck rigidity. Normal range of motion present.  Cardiovascular: Normal rate, regular rhythm and intact distal pulses.  No murmur heard. Pulses:      Radial pulses are 2+ on the right side, and 2+ on the left side.       Dorsalis pedis pulses are 2+ on the right side, and 2+ on the left side.       Posterior tibial pulses are 2+ on the right side, and 2+ on the left side.  Pulmonary/Chest: Effort normal and breath sounds normal.  Underneath right breast along the T6 nerve distribution there are patchy areas of blanchable erythema with overlying fluid-filled blisters consistent with shingles.  Rash does not cross midline.  There is no overlying excoriations or superimposed infection. No evidence of yeast infection.   Abdominal: Soft. Bowel sounds are normal. There is no tenderness. There is no rebound and no guarding.  Musculoskeletal: She exhibits no edema.  Lymphadenopathy:    She has no cervical adenopathy.  Neurological: She is alert.  Skin: Skin is warm and dry. Rash noted. She is not diaphoretic.  No rash to palms  Psychiatric: She has a normal mood and affect.  Nursing note and vitals reviewed.    ED Treatments / Results  Labs (all labs ordered are listed, but only abnormal results are displayed) Labs Reviewed - No data to  display  EKG None  Radiology No results found.  Procedures Procedures (including critical care time)  Medications Ordered in ED Medications - No data to display   Initial Impression / Assessment and Plan / ED Course  I have reviewed the triage vital signs and the nursing notes.  Pertinent labs & imaging results that were available during my care of the patient were reviewed by me and considered in my medical decision making (see chart for details).     22 y.o. female with rash consistent with shingles following the T6 nerve distribution on right.  Patient with erythematous patches with collections of blisters overlying.  Rash does not cross midline.  She reports pain, itching, burning and occasional tingling with this.  Patient is afebrile and denies history of immunocompromise.  There is  no eye involvement.  No superimposed infection.. No concern for SJS, TEN, TSS, tick borne illness, syphilis.  Discussed with patient care when handling or in contact with children not currently immunized against chickenpox. Discussed not to breastfeed while taking this medication. Will treat with antivirals and have follow-up with PCP in 1 week.  Patient is to take Benadryl as needed for itching.  Return precautions discussed.  Patient appears safe for discharge.  Final Clinical Impressions(s) / ED Diagnoses   Final diagnoses:  Herpes zoster without complication    ED Discharge Orders         Ordered    acyclovir (ZOVIRAX) 400 MG tablet  5 times daily     06/16/18 1356           Princella Pellegrini 06/16/18 1357    Gerhard Munch, MD 06/16/18 1651

## 2019-03-26 DIAGNOSIS — Z8719 Personal history of other diseases of the digestive system: Secondary | ICD-10-CM | POA: Insufficient documentation

## 2019-07-25 ENCOUNTER — Other Ambulatory Visit: Payer: Self-pay

## 2019-07-25 ENCOUNTER — Emergency Department (HOSPITAL_COMMUNITY)
Admission: EM | Admit: 2019-07-25 | Discharge: 2019-07-26 | Disposition: A | Discharge status: Home / Self Care | Payer: Medicaid Other | Attending: Emergency Medicine | Admitting: Emergency Medicine

## 2019-07-25 ENCOUNTER — Encounter (HOSPITAL_COMMUNITY): Payer: Self-pay | Admitting: Emergency Medicine

## 2019-07-25 DIAGNOSIS — Z79899 Other long term (current) drug therapy: Secondary | ICD-10-CM | POA: Insufficient documentation

## 2019-07-25 DIAGNOSIS — G43C0 Periodic headache syndromes in child or adult, not intractable: Secondary | ICD-10-CM | POA: Diagnosis not present

## 2019-07-25 DIAGNOSIS — R519 Headache, unspecified: Secondary | ICD-10-CM | POA: Diagnosis present

## 2019-07-25 MED ORDER — METOCLOPRAMIDE HCL 5 MG/ML IJ SOLN
10.0000 mg | Freq: Once | INTRAMUSCULAR | Status: AC
  Administered 2019-07-26: 10 mg via INTRAVENOUS
  Filled 2019-07-25: qty 2

## 2019-07-25 MED ORDER — DEXAMETHASONE SODIUM PHOSPHATE 10 MG/ML IJ SOLN
10.0000 mg | Freq: Once | INTRAMUSCULAR | Status: AC
  Administered 2019-07-26: 10 mg via INTRAVENOUS
  Filled 2019-07-25: qty 1

## 2019-07-25 MED ORDER — KETOROLAC TROMETHAMINE 15 MG/ML IJ SOLN
15.0000 mg | Freq: Once | INTRAMUSCULAR | Status: AC
  Administered 2019-07-26: 15 mg via INTRAVENOUS
  Filled 2019-07-25: qty 1

## 2019-07-25 NOTE — ED Triage Notes (Signed)
Patient c/o headache with nausea and light sensitivity x3 days. Hx migraines. Denies V/D.

## 2019-07-26 LAB — POC URINE PREG, ED: Preg Test, Ur: NEGATIVE

## 2019-07-26 MED ORDER — IBUPROFEN 600 MG PO TABS: 600.0000 mg | ORAL_TABLET | Freq: Four times a day (QID) | ORAL | 0 refills | Status: DC | PRN

## 2019-07-26 NOTE — ED Provider Notes (Addendum)
[facility]-EMERGENCY DEPT Provider Note   CSN: [id number] Arrival date & time: 07/25/19  2032     History   Chief Complaint Chief Complaint  Patient presents with  . Headache    HPI [name] is a 23 y.o. female.     HPI  23 year old comes in a chief complaint of headache.  She has history of depression and was diagnosed with migraine earlier.  She has not seen a neurologist in over 3 years because of lack of insurance.  She reports that her headaches had resolved pretty much but has started coming back the last few weeks.  The headaches typically resolve with her napping, but this current headache has not resolved in over 2 days now.  The headache is constant, frontal, throbbing with associated nausea and there is photophobia.  She denies any associated fevers or chills.  The headache is not positional.  Patient does not take any oral contraceptives and denies any history of clotting disorder.  No history of brain aneurysm, brain clots or brain bleed in the family.  Past Medical History:  Diagnosis Date  . Chronic kidney disease    kidney stones and kidney infection with last pregnancy  . Depression   . Incisional hernia 04/2013    Patient Active Problem List   Diagnosis Date Noted  . Incisional hernia 10/30/2012    Past Surgical History:  Procedure Laterality Date  . CHOLECYSTECTOMY  04/03/2012   Procedure: LAPAROSCOPIC CHOLECYSTECTOMY WITH INTRAOPERATIVE CHOLANGIOGRAM;  Surgeon: Velora Heckler, MD;  Location: [facility];  Service: General;  Laterality: N/A;  . ERCP  03/12/2012   Procedure: ENDOSCOPIC RETROGRADE CHOLANGIOPANCREATOGRAPHY (ERCP);  Surgeon: Petra Kuba, MD;  Location: [facility];  Service: Endoscopy;  Laterality: N/A;  . INGUINAL HERNIA REPAIR N/A 05/15/2013   Procedure: HERNIA REPAIR INCISIONAL;  Surgeon: Velora Heckler, MD;  Location: [facility];  Service: General;  Laterality: N/A;  . INSERTION OF MESH N/A 05/15/2013   Procedure: INSERTION OF MESH;  Surgeon: Velora Heckler, MD;  Location: [facility];  Service: General;  Laterality: N/A;  . WISDOM TOOTH EXTRACTION       OB History    Gravida  4   Para  3   Term  1   Preterm  2   AB  0   Living  2     SAB  0   TAB  0   Ectopic  0   Multiple  0   Live Births  2            Home Medications    Prior to Admission medications   Medication Sig Start Date End Date Taking? Authorizing Provider  Multiple Vitamin (MULTIVITAMIN WITH MINERALS) TABS tablet Take 1 tablet by mouth daily.   Yes [provider]  ibuprofen (ADVIL) 600 MG tablet Take 1 tablet (600 mg total) by mouth every 6 (six) hours as needed. 07/26/19   Derwood Kaplan, MD    Family History Family History  Problem Relation Age of Onset  . Hypertension Mother   . Hypertension Father   . Diabetes Paternal Grandmother   . Cancer Paternal Grandfather   . Seizures Paternal Grandfather     Social History Social History   Tobacco Use  . Smoking status: Never Smoker  . Smokeless tobacco: Never Used  Substance Use Topics  . Alcohol use: Yes    Comment: last drink 2 yrs ago  . Drug use: No  Allergies   Penicillins   Review of Systems Review of Systems  Constitutional: Positive for activity change.  Eyes: Positive for photophobia.  Gastrointestinal: Positive for nausea.  Neurological: Positive for headaches.     Physical Exam Updated Vital Signs BP 113/66   Pulse 72   Temp 98.7 F (37.1 C)   Resp 18   LMP 06/29/2019   SpO2 97%   Physical Exam Vitals signs and nursing note reviewed.  Constitutional:      Appearance: She is well-developed.  HENT:     Head: Normocephalic and atraumatic.  Eyes:     Extraocular Movements: Extraocular movements intact.     Pupils: Pupils are equal, round, and reactive to light.  Neck:     Musculoskeletal: Neck supple.     Comments: No meningismus Cardiovascular:     Rate and Rhythm: Normal  rate and regular rhythm.     Heart sounds: Normal heart sounds.  Pulmonary:     Effort: Pulmonary effort is normal. No respiratory distress.  Abdominal:     Palpations: Abdomen is soft.     Tenderness: There is no abdominal tenderness. There is no guarding or rebound.  Skin:    General: Skin is warm and dry.  Neurological:     Mental Status: She is alert and oriented to person, place, and time.     GCS: GCS eye subscore is 4. GCS verbal subscore is 5. GCS motor subscore is 6.     Cranial Nerves: No cranial nerve deficit or dysarthria.     Sensory: No sensory deficit.     Motor: No weakness.      ED Treatments / Results  Labs (all labs ordered are listed, but only abnormal results are displayed) Labs Reviewed  POC URINE PREG, ED    EKG None  Radiology No results found.  Procedures Procedures (including critical care time)  Medications Ordered in ED Medications  ketorolac (TORADOL) 15 MG/ML injection 15 mg (15 mg Intravenous Given 07/26/19 0022)  metoCLOPramide (REGLAN) injection 10 mg (10 mg Intravenous Given 07/26/19 0022)  dexamethasone (DECADRON) injection 10 mg (10 mg Intravenous Given 07/26/19 0023)     Initial Impression / Assessment and Plan / ED Course  I have reviewed the triage vital signs and the nursing notes.  Pertinent labs & imaging results that were available during my care of the patient were reviewed by me and considered in my medical decision making (see chart for details).  Clinical Course as of Jul 26 303  Sat Jul 26, 2019  [id number] Patient reassessed. Pt is comfortable at this time. Headache is at 2/10 compared to 8/10.  She wants to go home rather than get more meds. Results of the workup discussed. Strict ER return precautions discussed. Follow up instruction discussed, and pt agrees with the plan and is comfortable with it.    [AN]    Clinical Course User Index [AN] Derwood KaplanNanavati, Koehn Salehi, MD       23 year old with history of headaches and  migraine disorder comes in a chief complaint of headache.  She states that she has been migraine fever free for the last 2 or 3 years, but that the headaches have returned over the past few weeks.  She reports that her headaches are similar to her prior migraine headaches and there are associated symptoms that are positive for migraine type headaches as well.  There is no meningismus, patient does not have risk for thrombosis, the headaches are not positional and the neuro  exam is nonfocal.  We will give her headache cocktail to break her headache.  Final Clinical Impressions(s) / ED Diagnoses   Final diagnoses:  Periodic headache syndrome, not intractable    ED Discharge Orders         Ordered    ibuprofen (ADVIL) 600 MG tablet  Every 6 hours PRN     07/26/19 0232           Varney Biles, MD 07/26/19 3559    Varney Biles, MD 07/26/19 7416

## 2019-07-26 NOTE — ED Notes (Signed)
Pt was verbalized discharge instructions. Pt had no further questions at this time. NAD. 

## 2019-07-26 NOTE — Discharge Instructions (Signed)
Please take ibuprofen every 6 hours for the next 2 to 3 days. See the [facility] team by calling and setting an appointment when available.  Please return to the ER if the headache gets severe and in not improving, you have associated new one sided numbness, tingling, weakness or confusion, seizures, poor balance or poor vision.

## 2019-08-28 DIAGNOSIS — Z302 Encounter for sterilization: Secondary | ICD-10-CM | POA: Insufficient documentation

## 2019-10-24 NOTE — L&D Delivery Note (Signed)
Delivery Note At 2:29 AM a viable female was delivered via Vaginal, Spontaneous (Presentation: direct OA). Loose nuchal cord, reduced prior to delivery.  APGAR: 8, 9; weight pending.   Placenta status: Spontaneous, Intact.  Cord: 3v with marginal insertion.  Anesthesia: Epidural Episiotomy: None Lacerations: none Est. Blood Loss (mL): 100  Mom to postpartum.  Baby to Couplet care / Skin to Skin.  Alric Seton 09/02/2020, 2:41 AM

## 2020-03-04 ENCOUNTER — Encounter: Payer: Self-pay | Admitting: Obstetrics

## 2020-03-17 ENCOUNTER — Encounter: Payer: Medicaid Other | Admitting: Advanced Practice Midwife

## 2020-04-06 ENCOUNTER — Encounter: Payer: Self-pay | Admitting: Obstetrics & Gynecology

## 2020-04-06 ENCOUNTER — Other Ambulatory Visit (HOSPITAL_COMMUNITY)
Admission: RE | Admit: 2020-04-06 | Discharge: 2020-04-06 | Disposition: A | Discharge status: Home / Self Care | Payer: Medicaid Other | Source: Ambulatory Visit | Attending: Advanced Practice Midwife | Admitting: Advanced Practice Midwife

## 2020-04-06 ENCOUNTER — Other Ambulatory Visit: Payer: Self-pay

## 2020-04-06 ENCOUNTER — Ambulatory Visit (INDEPENDENT_AMBULATORY_CARE_PROVIDER_SITE_OTHER): Payer: Medicaid Other | Admitting: Obstetrics & Gynecology

## 2020-04-06 VITALS — BP 118/75 | HR 81 | Wt 228.0 lb

## 2020-04-06 DIAGNOSIS — Z641 Problems related to multiparity: Secondary | ICD-10-CM | POA: Insufficient documentation

## 2020-04-06 DIAGNOSIS — Z3480 Encounter for supervision of other normal pregnancy, unspecified trimester: Secondary | ICD-10-CM | POA: Diagnosis not present

## 2020-04-06 DIAGNOSIS — Z8759 Personal history of other complications of pregnancy, childbirth and the puerperium: Secondary | ICD-10-CM

## 2020-04-06 DIAGNOSIS — Z8751 Personal history of pre-term labor: Secondary | ICD-10-CM | POA: Diagnosis not present

## 2020-04-06 DIAGNOSIS — O99212 Obesity complicating pregnancy, second trimester: Secondary | ICD-10-CM

## 2020-04-06 DIAGNOSIS — Z3A17 17 weeks gestation of pregnancy: Secondary | ICD-10-CM

## 2020-04-06 DIAGNOSIS — E669 Obesity, unspecified: Secondary | ICD-10-CM | POA: Insufficient documentation

## 2020-04-06 DIAGNOSIS — Z6841 Body Mass Index (BMI) 40.0 and over, adult: Secondary | ICD-10-CM

## 2020-04-06 DIAGNOSIS — O2690 Pregnancy related conditions, unspecified, unspecified trimester: Secondary | ICD-10-CM

## 2020-04-06 DIAGNOSIS — R102 Pelvic and perineal pain: Secondary | ICD-10-CM

## 2020-04-06 DIAGNOSIS — O09899 Supervision of other high risk pregnancies, unspecified trimester: Secondary | ICD-10-CM | POA: Insufficient documentation

## 2020-04-06 MED ORDER — ASPIRIN EC 81 MG PO TBEC: 81.0000 mg | DELAYED_RELEASE_TABLET | Freq: Every day | ORAL | 11 refills | Status: DC

## 2020-04-06 NOTE — Progress Notes (Signed)
Subjective:    [name] is a [id number] [redacted]w[redacted]d being seen today for her first obstetrical visit at [facility], transfer of care from [facility].  Her obstetrical history is significant for excessive weight gain, obesity, pregnancy induced hypertension and h/o late preterm delivery. Patient does intend to breast feed. Pregnancy history fully reviewed.  Patient reports occasional contractions and intermittent pressure.  Vitals:   04/06/20 0810  BP: 118/75  Pulse: 81  Weight: 228 lb (103.4 kg)    HISTORY: OB History  Gravida Para Term Preterm AB Living  8 3 1 2 3 2   SAB TAB Ectopic Multiple Live Births  3 0 0 0 2    # Outcome Date GA Lbr Len/2nd Weight Sex Delivery Anes PTL Lv  8 Current           7 Term 04/13/14 [redacted]w[redacted]d   M Vag-Spont   LIV     Birth Comments: System Generated. Please review and update pregnancy details.  6 Preterm 02/20/12 [redacted]w[redacted]d 04:42 / 00:15 6 lb 11.2 oz (3.04 kg) M Vag-Spont EPI  LIV  5 Gravida           4 SAB           3 SAB           2 SAB           1 Preterm      Vag-Spont      Past Medical History:  Diagnosis Date  . Chronic kidney disease    kidney stones and kidney infection with last pregnancy  . Depression   . Incisional hernia 04/2013   Past Surgical History:  Procedure Laterality Date  . CHOLECYSTECTOMY  04/03/2012   Procedure: LAPAROSCOPIC CHOLECYSTECTOMY WITH INTRAOPERATIVE CHOLANGIOGRAM;  Surgeon: 06/03/2012, MD;  Location: [facility];  Service: General;  Laterality: N/A;  . ERCP  03/12/2012   Procedure: ENDOSCOPIC RETROGRADE CHOLANGIOPANCREATOGRAPHY (ERCP);  Surgeon: 03/14/2012, MD;  Location: [facility] OR;  Service: Endoscopy;  Laterality: N/A;  . INGUINAL HERNIA REPAIR N/A 05/15/2013   Procedure: HERNIA REPAIR INCISIONAL;  Surgeon: 05/17/2013, MD;  Location: [facility];  Service: General;  Laterality: N/A;  . INSERTION OF MESH N/A 05/15/2013   Procedure: INSERTION OF MESH;  Surgeon: 05/17/2013, MD;  Location: [facility];  Service: General;  Laterality: N/A;  . WISDOM TOOTH EXTRACTION     Family History  Problem Relation Age of Onset  . Hypertension Mother   . Hypertension Father   . Diabetes Paternal Grandmother   . Cancer Paternal Grandfather   . Seizures Paternal Grandfather      Exam    Uterus:   17wks  Pelvic Exam:    Perineum: No Hemorrhoids   Vulva: normal   Vagina:  normal mucosa   Cervix: no cervical motion tenderness   Adnexa: normal adnexa and no mass, fullness, tenderness   Bony Pelvis: gynecoid   Skin: normal coloration and turgor, no rashes    Neurologic: oriented, normal   Extremities: normal strength, tone, and muscle mass   HEENT PERRLA   Cardiovascular: regular rate and rhythm   Respiratory:  appears well, vitals normal, no respiratory distress, acyanotic, normal RR, ear and throat exam is normal, neck free of mass or lymphadenopathy, chest clear, no wheezing, crepitations, rhonchi, normal symmetric air entry   Abdomen: soft, non-tender; bowel sounds normal; no masses,  no organomegaly   Urinary: urethral meatus normal  Assessment:    Pregnancy: [id number] Patient Active Problem List   Diagnosis Date Noted  . Supervision of other normal pregnancy, antepartum 04/06/2020  . Incisional hernia 10/30/2012        Plan:     Initial labs drawn. Prenatal vitamins. Problem list reviewed and updated. Genetic Screening discussed Quad Screen: requested.  Ultrasound discussed; fetal survey: requested.  Follow up in 4 weeks. 80% of 20 min visit spent on counseling and coordination of care.  Discussed round ligament pain, pregnancy support belt, tylenol for alleviation of symptoms. Prev A1C 5.    Cherre Blanc 04/06/2020

## 2020-04-06 NOTE — Patient Instructions (Signed)

## 2020-04-06 NOTE — Addendum Note (Signed)
Addended by: Thom Chimes on: 04/06/2020 04:37 PM   Modules accepted: Orders

## 2020-04-08 LAB — CERVICOVAGINAL ANCILLARY ONLY
Chlamydia: NEGATIVE
Comment: NEGATIVE
Comment: NORMAL
Neisseria Gonorrhea: NEGATIVE

## 2020-04-21 ENCOUNTER — Telehealth: Payer: Self-pay

## 2020-04-21 ENCOUNTER — Inpatient Hospital Stay (HOSPITAL_COMMUNITY)
Admission: AD | Admit: 2020-04-21 | Discharge: 2020-04-21 | Disposition: A | Discharge status: Home / Self Care | Payer: Medicaid Other | Attending: Obstetrics and Gynecology | Admitting: Obstetrics and Gynecology

## 2020-04-21 ENCOUNTER — Other Ambulatory Visit: Payer: Self-pay

## 2020-04-21 ENCOUNTER — Encounter (HOSPITAL_COMMUNITY): Payer: Self-pay | Admitting: Obstetrics and Gynecology

## 2020-04-21 DIAGNOSIS — Z3A19 19 weeks gestation of pregnancy: Secondary | ICD-10-CM | POA: Insufficient documentation

## 2020-04-21 DIAGNOSIS — Z791 Long term (current) use of non-steroidal anti-inflammatories (NSAID): Secondary | ICD-10-CM | POA: Insufficient documentation

## 2020-04-21 DIAGNOSIS — M545 Low back pain: Secondary | ICD-10-CM | POA: Insufficient documentation

## 2020-04-21 DIAGNOSIS — Z7982 Long term (current) use of aspirin: Secondary | ICD-10-CM | POA: Insufficient documentation

## 2020-04-21 DIAGNOSIS — Z880 Allergy status to penicillin: Secondary | ICD-10-CM | POA: Diagnosis not present

## 2020-04-21 DIAGNOSIS — Z8759 Personal history of other complications of pregnancy, childbirth and the puerperium: Secondary | ICD-10-CM | POA: Insufficient documentation

## 2020-04-21 DIAGNOSIS — N189 Chronic kidney disease, unspecified: Secondary | ICD-10-CM | POA: Diagnosis not present

## 2020-04-21 DIAGNOSIS — O26892 Other specified pregnancy related conditions, second trimester: Secondary | ICD-10-CM | POA: Insufficient documentation

## 2020-04-21 DIAGNOSIS — O26832 Pregnancy related renal disease, second trimester: Secondary | ICD-10-CM | POA: Insufficient documentation

## 2020-04-21 DIAGNOSIS — Z3480 Encounter for supervision of other normal pregnancy, unspecified trimester: Secondary | ICD-10-CM

## 2020-04-21 DIAGNOSIS — R102 Pelvic and perineal pain: Secondary | ICD-10-CM | POA: Diagnosis not present

## 2020-04-21 DIAGNOSIS — Z87442 Personal history of urinary calculi: Secondary | ICD-10-CM | POA: Insufficient documentation

## 2020-04-21 DIAGNOSIS — Z79899 Other long term (current) drug therapy: Secondary | ICD-10-CM | POA: Insufficient documentation

## 2020-04-21 DIAGNOSIS — O09292 Supervision of pregnancy with other poor reproductive or obstetric history, second trimester: Secondary | ICD-10-CM | POA: Diagnosis not present

## 2020-04-21 LAB — URINALYSIS, ROUTINE W REFLEX MICROSCOPIC
Bilirubin Urine: NEGATIVE
Glucose, UA: NEGATIVE mg/dL
Hgb urine dipstick: NEGATIVE
Ketones, ur: NEGATIVE mg/dL
Nitrite: NEGATIVE
Protein, ur: NEGATIVE mg/dL
Specific Gravity, Urine: 1.014 (ref 1.005–1.030)
pH: 6 (ref 5.0–8.0)

## 2020-04-21 MED ORDER — BLOOD PRESSURE MONITOR KIT: 1.0000 | PACK | 0 refills | Status: DC

## 2020-04-21 MED ORDER — CYCLOBENZAPRINE HCL 10 MG PO TABS
10.0000 mg | ORAL_TABLET | Freq: Three times a day (TID) | ORAL | 0 refills | Status: DC | PRN
Start: 2020-04-21 — End: 2020-05-10

## 2020-04-21 MED ORDER — COMFORT FIT MATERNITY SUPP LG MISC
1.0000 | Freq: Every day | 0 refills | Status: DC
Start: 2020-04-21 — End: 2020-09-03

## 2020-04-21 NOTE — MAU Provider Note (Signed)
History     CSN: [id number]  Arrival date and time: 04/21/20 1110   First Provider Initiated Contact with Patient 04/21/20 1148      Chief Complaint  Patient presents with  . Pelvic Pain  . Dizziness   24 y.o. [id number] @19 .3 wks presenting pelvic pain and LBP. Sx started 3 weeks ago. Pelvic pain is worse when she is trying to put pants on and with standing. Rates pain 8/10. She tried Tylenol but it didn't help. She admits to lifting her 40 lb toddler. Denies VB, vaginal discharge, and urinary sx. LBP is worse on right. +FM.   OB History    Gravida  8   Para  4   Term  1   Preterm  3   AB  3   Living  4     SAB  3   TAB  0   Ectopic  0   Multiple  0   Live Births  4           Past Medical History:  Diagnosis Date  . Chronic kidney disease    kidney stones and kidney infection with last pregnancy  . Depression   . Incisional hernia 04/2013    Past Surgical History:  Procedure Laterality Date  . CHOLECYSTECTOMY  04/03/2012   Procedure: LAPAROSCOPIC CHOLECYSTECTOMY WITH INTRAOPERATIVE CHOLANGIOGRAM;  Surgeon: 06/03/2012, MD;  Location: [facility];  Service: General;  Laterality: N/A;  . ERCP  03/12/2012   Procedure: ENDOSCOPIC RETROGRADE CHOLANGIOPANCREATOGRAPHY (ERCP);  Surgeon: 03/14/2012, MD;  Location: [facility] OR;  Service: Endoscopy;  Laterality: N/A;  . INGUINAL HERNIA REPAIR N/A 05/15/2013   Procedure: HERNIA REPAIR INCISIONAL;  Surgeon: 05/17/2013, MD;  Location: [facility];  Service: General;  Laterality: N/A;  . INSERTION OF MESH N/A 05/15/2013   Procedure: INSERTION OF MESH;  Surgeon: 05/17/2013, MD;  Location: [facility];  Service: General;  Laterality: N/A;  . WISDOM TOOTH EXTRACTION      Family History  Problem Relation Age of Onset  . Hypertension Mother   . Hypertension Father   . Diabetes Paternal Grandmother   . Cancer Paternal Grandfather   . Seizures Paternal Grandfather     Social History    Tobacco Use  . Smoking status: Never Smoker  . Smokeless tobacco: Never Used  Vaping Use  . Vaping Use: Never used  Substance Use Topics  . Alcohol use: Not Currently    Comment: last drink 2 yrs ago  . Drug use: No    Allergies:  Allergies  Allergen Reactions  . Penicillins Anaphylaxis, Itching, Swelling, Rash and Other (See Comments)    Has patient had a PCN reaction causing immediate rash, facial/tongue/throat swelling, SOB or lightheadedness with hypotension: Yes Has patient had a PCN reaction causing severe rash involving mucus membranes or skin necrosis: No Has patient had a PCN reaction that required hospitalization: No Has patient had a PCN reaction occurring within the last 10 years: No If all of the above answers are "NO", then may proceed with Cephalosporin use.  And swelling    No medications prior to admission.    Review of Systems  Constitutional: Negative for chills and fever.  Gastrointestinal: Negative for abdominal pain, constipation, diarrhea, nausea and vomiting.  Genitourinary: Positive for pelvic pain. Negative for dysuria, frequency, hematuria, vaginal bleeding and vaginal discharge.  Musculoskeletal: Positive for back pain.   Physical Exam   Blood pressure 128/65, pulse 88,  temperature 98 F (36.7 C), temperature source Oral, resp. rate 18, height 5\' 5"  (1.651 m), weight 103.4 kg, last menstrual period 06/29/2019, SpO2 100 %, unknown if currently breastfeeding.  Physical Exam Vitals and nursing note reviewed. Exam conducted with a chaperone present.  Constitutional:      General: She is not in acute distress.    Appearance: Normal appearance.  HENT:     Head: Normocephalic and atraumatic.  Cardiovascular:     Rate and Rhythm: Normal rate.  Pulmonary:     Effort: Pulmonary effort is normal. No respiratory distress.  Abdominal:     General: There is no distension.     Tenderness: There is no abdominal tenderness.  Genitourinary:     Comments: VE: closed/long Musculoskeletal:        General: Normal range of motion.  Skin:    General: Skin is warm and dry.  Neurological:     General: No focal deficit present.     Mental Status: She is alert and oriented to person, place, and time.  Psychiatric:        Mood and Affect: Mood normal.   FHT 143  Results for orders placed or performed during the hospital encounter of 04/21/20 (from the past 24 hour(s))  Urinalysis, Routine w reflex microscopic     Status: Abnormal   Collection Time: 04/21/20 12:06 PM  Result Value Ref Range   Color, Urine YELLOW YELLOW   APPearance HAZY (A) CLEAR   Specific Gravity, Urine 1.014 1.005 - 1.030   pH 6.0 5.0 - 8.0   Glucose, UA NEGATIVE NEGATIVE mg/dL   Hgb urine dipstick NEGATIVE NEGATIVE   Bilirubin Urine NEGATIVE NEGATIVE   Ketones, ur NEGATIVE NEGATIVE mg/dL   Protein, ur NEGATIVE NEGATIVE mg/dL   Nitrite NEGATIVE NEGATIVE   Leukocytes,Ua TRACE (A) NEGATIVE   RBC / HPF 0-5 0 - 5 RBC/hpf   WBC, UA 0-5 0 - 5 WBC/hpf   Bacteria, UA MANY (A) NONE SEEN   Squamous Epithelial / LPF 6-10 0 - 5   Mucus PRESENT    MAU Course  Procedures  MDM Labs ordered and reviewed. Recent cultures negative, not repeated. No evidence of UTI or threatened SAB. Pain likely MSK in nature. Discussed comfort measures. Rx maternity belt and Flexeril. Stable for discharge home.   Assessment and Plan   1. [redacted] weeks gestation of pregnancy   2. Pelvic pain affecting pregnancy in second trimester, antepartum   3. History of gestational hypertension    Discharge home Follow up [facility] as scheduled Rx Flexeril Rx maternity belt Tylenol/heating pad prn  Allergies as of 04/21/2020      Reactions   Penicillins Anaphylaxis, Itching, Swelling, Rash, Other (See Comments)   Has patient had a PCN reaction causing immediate rash, facial/tongue/throat swelling, SOB or lightheadedness with hypotension: Yes Has patient had a PCN reaction causing severe rash  involving mucus membranes or skin necrosis: No Has patient had a PCN reaction that required hospitalization: No Has patient had a PCN reaction occurring within the last 10 years: No If all of the above answers are "NO", then may proceed with Cephalosporin use.  And swelling      Medication List    STOP taking these medications   ibuprofen 600 MG tablet Commonly known as: ADVIL     TAKE these medications   aspirin EC 81 MG tablet Take 1 tablet (81 mg total) by mouth daily. Swallow whole.   Comfort Fit Maternity Supp Lg Misc 1  application by Does not apply route daily.   cyclobenzaprine 10 MG tablet Commonly known as: FLEXERIL Take 1 tablet (10 mg total) by mouth every 8 (eight) hours as needed (back pain).   multivitamin with minerals Tabs tablet Take 1 tablet by mouth daily.      Donette Larry, CNM 04/21/2020, 2:04 PM

## 2020-04-21 NOTE — MAU Note (Signed)
.   [name] is a 24 y.o. at [redacted]w[redacted]d here in MAU reporting: pelvic pain and dizziness for a couple of weeks.Denies any VB or abnormal discharge LMP: 06/29/19 Onset of complaint: 2 weeks Pain score: 8 Vitals:   04/21/20 1127  BP: (!) 146/63  Pulse: 93  Resp: 16  Temp: 98.1 F (36.7 C)  SpO2: 100%     FHT:143 Lab orders placed from triage: UA

## 2020-04-21 NOTE — Discharge Instructions (Signed)
PREGNANCY SUPPORT BELT: You are not alone, Seventy-five percent of women have some sort of abdominal or back pain at some point in their pregnancy. Your baby is growing at a fast pace, which means that your whole body is rapidly trying to adjust to the changes. As your uterus grows, your back may start feeling a bit under stress and this can result in back or abdominal pain that can go from mild, and therefore bearable, to severe pains that will not allow you to sit or lay down comfortably, When it comes to dealing with pregnancy-related pains and cramps, some pregnant women usually prefer natural remedies, which the market is filled with nowadays. For example, wearing a pregnancy support belt can help ease and lessen your discomfort and pain. WHAT ARE THE BENEFITS OF WEARING A PREGNANCY SUPPORT BELT? A pregnancy support belt provides support to the lower portion of the belly taking some of the weight of the growing uterus and distributing to the other parts of your body. It is designed make you comfortable and gives you extra support. Over the years, the pregnancy apparel market has been studying the needs and wants of pregnant women and they have come up with the most comfortable pregnancy support belts that woman could ever ask for. In fact, you will no longer have to wear a stretched-out or bulky pregnancy belt that is visible underneath your clothes and makes you feel even more uncomfortable. Nowadays, a pregnancy support belt is made of comfortable and stretchy materials that will not irritate your skin but will actually make you feel at ease and you will not even notice you are wearing it. They are easy to put on and adjust during the day and can be worn at night for additional support.  BENEFITS: . Relives Back pain . Relieves Abdominal Muscle and Leg Pain . Stabilizes the Pelvic Ring . Offers a Cushioned Abdominal Lift Pad . Relieves pressure on the Sciatic Nerve Within Minutes WHERE TO GET  YOUR PREGNANCY BELT: [employer] [phone] @[phone]  [address] [place], [place]   Sacroiliac Joint Dysfunction  Sacroiliac joint dysfunction is a condition that causes inflammation on one or both sides of the sacroiliac (SI) joint. The SI joint connects the lower part of the spine (sacrum) with the two upper portions of the pelvis (ilium). This condition causes deep aching or burning pain in the low back. In some cases, the pain may also spread into one or both buttocks, hips, or thighs. What are the causes? This condition may be caused by:  Pregnancy. During pregnancy, extra stress is put on the SI joints because the pelvis widens.  Injury, such as: ? Injuries from car accidents. ? Sports-related injuries. ? Work-related injuries.  Having one leg that is shorter than the other.  Conditions that affect the joints, such as: ? Rheumatoid arthritis. ? Gout. ? Psoriatic arthritis. ? Joint infection (septic arthritis). Sometimes, the cause of SI joint dysfunction is not known. What are the signs or symptoms? Symptoms of this condition include:  Aching or burning pain in the lower back. The pain may also spread to other areas, such as: ? Buttocks. ? Groin. ? Thighs.  Muscle spasms in or around the painful areas.  Increased pain when standing, walking, running, stair climbing, bending, or lifting. How is this diagnosed? This condition is diagnosed with a physical exam and medical history. During the exam, the health care provider may move one or both of your legs to  different positions to check for pain. Various tests may be done to confirm the diagnosis, including:  Imaging tests to look for other causes of pain. These may include: ? MRI. ? CT scan. ? Bone scan.  Diagnostic injection. A numbing medicine is injected into the SI joint using a needle. If your pain is temporarily improved or stopped after the injection, this can indicate that SI joint  dysfunction is the problem. How is this treated? Treatment depends on the cause and severity of your condition. Treatment options may include:  Ice or heat applied to the lower back area after an injury. This may help reduce pain and muscle spasms.  Medicines to relieve pain or inflammation or to relax the muscles.  Wearing a back brace (sacroiliac brace) to help support the joint while your back is healing.  Physical therapy to increase muscle strength around the joint and flexibility at the joint. This may also involve learning proper body positions and ways of moving to relieve stress on the joint.  Direct manipulation of the SI joint.  Injections of steroid medicine into the joint to reduce pain and swelling.  Radiofrequency ablation to burn away nerves that are carrying pain messages from the joint.  Use of a device that provides electrical stimulation to help reduce pain at the joint.  Surgery to put in screws and plates that limit or prevent joint motion. This is rare. Follow these instructions at home: Medicines  Take over-the-counter and prescription medicines only as told by your health care provider.  Do not drive or use heavy machinery while taking prescription pain medicine.  If you are taking prescription pain medicine, take actions to prevent or treat constipation. Your health care provider may recommend that you: ? Drink enough fluid to keep your urine pale yellow. ? Eat foods that are high in fiber, such as fresh fruits and vegetables, whole grains, and beans. ? Limit foods that are high in fat and processed sugars, such as fried or sweet foods. ? Take an over-the-counter or prescription medicine for constipation. If you have a brace:  Wear the brace as told by your health care provider. Remove it only as told by your health care provider.  Keep the brace clean.  If the brace is not waterproof: ? Do not let it get wet. ? Cover it with a watertight covering  when you take a bath or a shower. Managing pain, stiffness, and swelling      Icing can help with pain and swelling. Heat may help with muscle tension or spasms. Ask your health care provider if you should use ice or heat.  If directed, put ice on the affected area: ? If you have a removable brace, remove it as told by your health care provider. ? Put ice in a plastic bag. ? Place a towel between your skin and the bag. ? Leave the ice on for 20 minutes, 2-3 times a day.  If directed, apply heat to the affected area. Use the heat source that your health care provider recommends, such as a moist heat pack or a heating pad. ? Place a towel between your skin and the heat source. ? Leave the heat on for 20-30 minutes. ? Remove the heat if your skin turns bright red. This is especially important if you are unable to feel pain, heat, or cold. You may have a greater risk of getting burned. General instructions  Rest as needed. Ask your health care provider what activities are  safe for you.  Return to your normal activities as told by your health care provider.  Exercise as directed by your health care provider or physical therapist.  Do not use any products that contain nicotine or tobacco, such as cigarettes and e-cigarettes. These can delay bone healing. If you need help quitting, ask your health care provider.  Keep all follow-up visits as told by your health care provider. This is important. Contact a health care provider if:  Your pain is not controlled with medicine.  You have a fever.  Your pain is getting worse. Get help right away if:  You have weakness, numbness, or tingling in your legs or feet.  You lose control of your bladder or bowel. Summary  Sacroiliac joint dysfunction is a condition that causes inflammation on one or both sides of the sacroiliac (SI) joint.  This condition causes deep aching or burning pain in the low back. In some cases, the pain may also  spread into one or both buttocks, hips, or thighs.  Treatment depends on the cause and severity of your condition. It may include medicines to reduce pain and swelling or to relax muscles. This information is not intended to replace advice given to you by your health care provider. Make sure you discuss any questions you have with your health care provider. Document Revised: 06/05/2018 Document Reviewed: 11/19/2017 Elsevier Patient Education  2020 [employer].

## 2020-04-21 NOTE — Telephone Encounter (Signed)
Pt called with vaginal pain and pressure  Pt asked about 17 P injections last appt on 04/06/20 start of injections was not discussed or doc pt states she forgot to mention as well Due to pt symptoms right now I advised her to go to [facility]U for an evaluation and I would f/u with a provider in the office regarding her records and 17 P injection Rx.  Pt voiced understanding.

## 2020-04-22 ENCOUNTER — Encounter: Payer: Self-pay | Admitting: Certified Nurse Midwife

## 2020-04-22 ENCOUNTER — Other Ambulatory Visit: Payer: Self-pay | Admitting: Certified Nurse Midwife

## 2020-04-22 DIAGNOSIS — A749 Chlamydial infection, unspecified: Secondary | ICD-10-CM | POA: Insufficient documentation

## 2020-04-22 DIAGNOSIS — O98819 Other maternal infectious and parasitic diseases complicating pregnancy, unspecified trimester: Secondary | ICD-10-CM | POA: Insufficient documentation

## 2020-04-22 DIAGNOSIS — O99210 Obesity complicating pregnancy, unspecified trimester: Secondary | ICD-10-CM | POA: Insufficient documentation

## 2020-04-22 DIAGNOSIS — O09899 Supervision of other high risk pregnancies, unspecified trimester: Secondary | ICD-10-CM

## 2020-04-27 ENCOUNTER — Telehealth: Payer: Self-pay

## 2020-04-27 NOTE — Telephone Encounter (Signed)
   [name] DOB: 1995-11-14 MRN: [id number]   RIDER WAIVER AND RELEASE OF LIABILITY  For purposes of improving physical access to our facilities, [name] is pleased to partner with third parties to provide [name] patients or other authorized individuals the option of convenient, on-demand ground transportation services (the Chiropractor") through use of the technology service that enables users to request on-demand ground transportation from independent third-party providers.  By opting to use and accept these [employer], I, the undersigned, hereby agree on behalf of myself, and on behalf of any minor child using the [employer] for whom I am the parent or legal guardian, as follows:  1. Science writer provided to me are provided by independent third-party transportation providers who are not [name] or employees and who are unaffiliated with [name]. 2. [facility] is neither a transportation carrier nor a common or public carrier. 3. [facility] has no control over the quality or safety of the transportation that occurs as a result of the [employer]. 4. [facility] cannot guarantee that any third-party transportation provider will complete any arranged transportation service. 5. [facility] makes no representation, warranty, or guarantee regarding the reliability, timeliness, quality, safety, suitability, or availability of any of the Transport Services or that they will be error free. 6. I fully understand that traveling by vehicle involves risks and dangers of serious bodily injury, including permanent disability, paralysis, and death. I agree, on behalf of myself and on behalf of any minor child using the Transport Services for whom I am the parent or legal guardian, that the entire risk arising out of my use of the [employer] remains solely with me, to the maximum extent permitted under applicable law. 7. The [employer] are provided "as is" and "as available." [name] disclaims all representations and warranties, express, implied or statutory, not expressly set out in these terms, including the implied warranties of merchantability and fitness for a particular purpose. 8. I hereby waive and release [name], its agents, employees, officers, directors, representatives, insurers, attorneys, assigns, successors, subsidiaries, and affiliates from any and all past, present, or future claims, demands, liabilities, actions, causes of action, or suits of any kind directly or indirectly arising from acceptance and use of the [employer]. 9. I further waive and release [name] and its affiliates from all present and future liability and responsibility for any injury or death to persons or damages to property caused by or related to the use of the [employer]. 10. I have read this Waiver and Release of Liability, and I understand the terms used in it and their legal significance. This Waiver is freely and voluntarily given with the understanding that my right (as well as the right of any minor child for whom I am the parent or legal guardian using the [employer]) to legal recourse against  in connection with the [employer] is knowingly surrendered in return for use of these services.   I attest that I read the consent document to [name], gave [name] the opportunity to ask questions and answered the questions asked (if any). I affirm that [name] then provided consent for she's participation in this program.     [name]

## 2020-04-28 ENCOUNTER — Ambulatory Visit (INDEPENDENT_AMBULATORY_CARE_PROVIDER_SITE_OTHER): Payer: Medicaid Other

## 2020-04-28 ENCOUNTER — Other Ambulatory Visit: Payer: Self-pay

## 2020-04-28 VITALS — BP 107/72 | HR 93 | Wt 224.0 lb

## 2020-04-28 DIAGNOSIS — Z8751 Personal history of pre-term labor: Secondary | ICD-10-CM

## 2020-04-28 DIAGNOSIS — O09212 Supervision of pregnancy with history of pre-term labor, second trimester: Secondary | ICD-10-CM | POA: Diagnosis not present

## 2020-04-28 DIAGNOSIS — Z3A20 20 weeks gestation of pregnancy: Secondary | ICD-10-CM | POA: Diagnosis not present

## 2020-04-28 MED ORDER — HYDROXYPROGESTERONE CAPROATE 275 MG/1.1ML ~~LOC~~ SOAJ
275.0000 mg | Freq: Once | SUBCUTANEOUS | Status: AC
  Administered 2020-04-28: 275 mg via SUBCUTANEOUS

## 2020-04-28 NOTE — Progress Notes (Signed)
OB presents for 17P injection, Office supply given in [place], tolerated well. Forms signed and faxed.  Next 17P in 1 week.  Administrations This Visit    HYDROXYprogesterone caproate (Makena) autoinjector 275 mg    Admin Date 04/28/2020 Action Given Dose 275 mg Route Subcutaneous Administered By Maretta Bees, RMA

## 2020-04-29 NOTE — Progress Notes (Signed)
I have reviewed this chart and agree with the RN/CMA assessment and management.    K. Meryl Cherylanne Ardelean, M.D. Attending [facility] (Faculty Practice)   

## 2020-04-30 ENCOUNTER — Other Ambulatory Visit: Payer: Self-pay | Admitting: *Deleted

## 2020-04-30 ENCOUNTER — Ambulatory Visit: Payer: Medicaid Other | Admitting: *Deleted

## 2020-04-30 ENCOUNTER — Ambulatory Visit: Payer: Medicaid Other | Attending: Certified Nurse Midwife

## 2020-04-30 ENCOUNTER — Other Ambulatory Visit: Payer: Self-pay

## 2020-04-30 ENCOUNTER — Ambulatory Visit: Payer: Self-pay | Admitting: *Deleted

## 2020-04-30 DIAGNOSIS — O09292 Supervision of pregnancy with other poor reproductive or obstetric history, second trimester: Secondary | ICD-10-CM | POA: Diagnosis not present

## 2020-04-30 DIAGNOSIS — E669 Obesity, unspecified: Secondary | ICD-10-CM

## 2020-04-30 DIAGNOSIS — Z3A20 20 weeks gestation of pregnancy: Secondary | ICD-10-CM

## 2020-04-30 DIAGNOSIS — Z363 Encounter for antenatal screening for malformations: Secondary | ICD-10-CM

## 2020-04-30 DIAGNOSIS — O98819 Other maternal infectious and parasitic diseases complicating pregnancy, unspecified trimester: Secondary | ICD-10-CM | POA: Insufficient documentation

## 2020-04-30 DIAGNOSIS — A749 Chlamydial infection, unspecified: Secondary | ICD-10-CM | POA: Diagnosis present

## 2020-04-30 DIAGNOSIS — O99210 Obesity complicating pregnancy, unspecified trimester: Secondary | ICD-10-CM

## 2020-04-30 DIAGNOSIS — Z8759 Personal history of other complications of pregnancy, childbirth and the puerperium: Secondary | ICD-10-CM | POA: Insufficient documentation

## 2020-04-30 DIAGNOSIS — O99212 Obesity complicating pregnancy, second trimester: Secondary | ICD-10-CM | POA: Diagnosis not present

## 2020-04-30 DIAGNOSIS — O09899 Supervision of other high risk pregnancies, unspecified trimester: Secondary | ICD-10-CM | POA: Diagnosis not present

## 2020-04-30 DIAGNOSIS — O09212 Supervision of pregnancy with history of pre-term labor, second trimester: Secondary | ICD-10-CM | POA: Diagnosis not present

## 2020-04-30 NOTE — Telephone Encounter (Signed)
Requesting information of normal blood pressure during pregnancy. Report of last b/p checked at home for 129/101. Patient 20 weeks and 5 days pregnant. B/p WNL at ultrasound appt today . C/o lightheadedness and occasional "floaters" at times with vision. Denies difficulty breathing , face numbness or swelling of face. Lower extremity swelling. Denies weakness on one side. Instructed patient to go to ED now . Care advise given and patient verbalized understanding of care advise.    Reason for Disposition . [1] Pregnant 20 or more weeks AND [2] Systolic BP  >= 140 OR Diastolic >= 90  Answer Assessment - Initial Assessment Questions 1. BLOOD PRESSURE: "What is the blood pressure?" "Did you take at least two measurements 5 minutes apart?"     Normal during ultra sound today but after visit last b/p 129/101 2. ONSET: "When did you take your blood pressure?"     Unknown time. "just a little bit ago before I called you" 3. HOW: "How did you obtain the blood pressure?" (e.g., visiting nurse, automatic home BP monitor)     Home b/p monitor 4. HISTORY: "Do you have a history of high blood pressure?"     No . Taking aspirin due to elevated b/p during pregnancy  5. MEDICATIONS: "Are you taking any medications for blood pressure?" "Have you missed any doses recently?"     No taking aspirin  6. OTHER SYMPTOMS: "Do you have any symptoms?" (e.g., headache, chest pain, blurred vision, difficulty breathing, weakness)     Lightheaded now , no blurred vision reports "floaters" at times, denies difficulty breathing  7. PREGNANCY: "Is there any chance you are pregnant?" "When was your last menstrual period?"     Yes 20 weeks and 5 days  Protocols used: BLOOD PRESSURE - HIGH-A-AH

## 2020-05-01 ENCOUNTER — Other Ambulatory Visit: Payer: Self-pay

## 2020-05-01 ENCOUNTER — Inpatient Hospital Stay (HOSPITAL_COMMUNITY)
Admission: AD | Admit: 2020-05-01 | Discharge: 2020-05-01 | Disposition: A | Discharge status: Home / Self Care | Payer: Medicaid Other | Attending: Obstetrics & Gynecology | Admitting: Obstetrics & Gynecology

## 2020-05-01 ENCOUNTER — Encounter (HOSPITAL_COMMUNITY): Payer: Self-pay | Admitting: Obstetrics & Gynecology

## 2020-05-01 DIAGNOSIS — O09292 Supervision of pregnancy with other poor reproductive or obstetric history, second trimester: Secondary | ICD-10-CM | POA: Diagnosis not present

## 2020-05-01 DIAGNOSIS — Z8249 Family history of ischemic heart disease and other diseases of the circulatory system: Secondary | ICD-10-CM | POA: Diagnosis not present

## 2020-05-01 DIAGNOSIS — O26892 Other specified pregnancy related conditions, second trimester: Secondary | ICD-10-CM

## 2020-05-01 DIAGNOSIS — O26832 Pregnancy related renal disease, second trimester: Secondary | ICD-10-CM | POA: Diagnosis not present

## 2020-05-01 DIAGNOSIS — R519 Headache, unspecified: Secondary | ICD-10-CM | POA: Diagnosis not present

## 2020-05-01 DIAGNOSIS — Z7982 Long term (current) use of aspirin: Secondary | ICD-10-CM | POA: Insufficient documentation

## 2020-05-01 DIAGNOSIS — N189 Chronic kidney disease, unspecified: Secondary | ICD-10-CM | POA: Insufficient documentation

## 2020-05-01 DIAGNOSIS — Z8759 Personal history of other complications of pregnancy, childbirth and the puerperium: Secondary | ICD-10-CM | POA: Diagnosis not present

## 2020-05-01 DIAGNOSIS — I129 Hypertensive chronic kidney disease with stage 1 through stage 4 chronic kidney disease, or unspecified chronic kidney disease: Secondary | ICD-10-CM | POA: Diagnosis not present

## 2020-05-01 DIAGNOSIS — Z880 Allergy status to penicillin: Secondary | ICD-10-CM | POA: Insufficient documentation

## 2020-05-01 DIAGNOSIS — O10212 Pre-existing hypertensive chronic kidney disease complicating pregnancy, second trimester: Secondary | ICD-10-CM | POA: Insufficient documentation

## 2020-05-01 DIAGNOSIS — R42 Dizziness and giddiness: Secondary | ICD-10-CM

## 2020-05-01 DIAGNOSIS — Z87442 Personal history of urinary calculi: Secondary | ICD-10-CM | POA: Insufficient documentation

## 2020-05-01 DIAGNOSIS — Z3A20 20 weeks gestation of pregnancy: Secondary | ICD-10-CM

## 2020-05-01 LAB — URINALYSIS, ROUTINE W REFLEX MICROSCOPIC
Bilirubin Urine: NEGATIVE
Glucose, UA: NEGATIVE mg/dL
Hgb urine dipstick: NEGATIVE
Ketones, ur: NEGATIVE mg/dL
Nitrite: NEGATIVE
Protein, ur: NEGATIVE mg/dL
Specific Gravity, Urine: 1.013 (ref 1.005–1.030)
pH: 7 (ref 5.0–8.0)

## 2020-05-01 MED ORDER — BUTALBITAL-APAP-CAFFEINE 50-325-40 MG PO TABS
2.0000 | ORAL_TABLET | Freq: Once | ORAL | Status: AC
  Administered 2020-05-01: 2 via ORAL
  Filled 2020-05-01: qty 2

## 2020-05-01 NOTE — [facility] Provider Note (Signed)
History     CSN: [id number]  Arrival date and time: 05/01/20 1552   First Provider Initiated Contact with Patient 05/01/20 1634      Chief Complaint  Patient presents with  . Hypertension   HPI [name] is a 24 y.o. [phone] at 15w5dwho presents stating her blood pressure was elevated at home and she has a headache for 2 days. She has not taken anything for the headache because "nothing ever works." She denies any abdominal pain, vaginal bleeding or discharge.   When questioned about nutrition, patient states she drinks "enough" water and had a "normal breakfast" today.   OB History    Gravida  8   Para  4   Term  1   Preterm  3   AB  3   Living  4     SAB  3   TAB  0   Ectopic  0   Multiple  0   Live Births  4           Past Medical History:  Diagnosis Date  . Anxiety   . Chlamydia 2018, 2021  . Chronic kidney disease    kidney stones and kidney infection with last pregnancy  . Depression   . Gonorrhea 2018  . Incisional hernia 04/2013  . LGSIL on Pap smear of cervix 2019  . Urethral diverticulum     Past Surgical History:  Procedure Laterality Date  . CHOLECYSTECTOMY  04/03/2012   Procedure: LAPAROSCOPIC CHOLECYSTECTOMY WITH INTRAOPERATIVE CHOLANGIOGRAM;  Surgeon: TEarnstine Regal MD;  Location: [facility];  Service: General;  Laterality: N/A;  . ERCP  03/12/2012   Procedure: ENDOSCOPIC RETROGRADE CHOLANGIOPANCREATOGRAPHY (ERCP);  Surgeon: MJeryl Columbia MD;  [place] [facility]  Service: Endoscopy;  Laterality: N/A;  . INGUINAL HERNIA REPAIR N/A 05/15/2013   Procedure: HERNIA REPAIR INCISIONAL;  Surgeon: TEarnstine Regal MD;  [facility]  Service: General;  Laterality: N/A;  . INSERTION OF MESH N/A 05/15/2013   Procedure: INSERTION OF MESH;  Surgeon: TEarnstine Regal MD;  [facility]  Service: General;  Laterality: N/A;  . URETERAL SSan [place]   . WISDOM TOOTH EXTRACTION      Family History   Problem Relation Age of Onset  . Hypertension Mother   . Hypertension Father   . Diabetes Paternal Grandmother   . Cancer Paternal Grandfather   . Seizures Paternal Grandfather     Social History   Tobacco Use  . Smoking status: Never Smoker  . Smokeless tobacco: Never Used  Vaping Use  . Vaping Use: Never used  Substance Use Topics  . Alcohol use: Not Currently    Comment: last drink 2 yrs ago  . Drug use: No    Allergies:  Allergies  Allergen Reactions  . Penicillins Anaphylaxis, Itching, Swelling, Rash and Other (See Comments)    Has patient had a PCN reaction causing immediate rash, facial/tongue/throat swelling, SOB or lightheadedness with hypotension: Yes Has patient had a PCN reaction causing severe rash involving mucus membranes or skin necrosis: No Has patient had a PCN reaction that required hospitalization: No Has patient had a PCN reaction occurring within the last 10 years: No If all of the above answers are "NO", then may proceed with Cephalosporin use.  And swelling    Medications Prior to Admission  Medication Sig Dispense Refill Last Dose  . aspirin EC 81 MG tablet Take 1 tablet (81 mg total) by  mouth daily. Swallow whole. 30 tablet 11 05/01/2020 at Unknown time  . Blood Pressure Monitor KIT 1 Device by Does not apply route once a week. To be monitored Regularly at home. 1 kit 0 05/01/2020 at Unknown time  . cyclobenzaprine (FLEXERIL) 10 MG tablet Take 1 tablet (10 mg total) by mouth every 8 (eight) hours as needed (back pain). 30 tablet 0 Past Week at Unknown time  . Elastic Bandages & Supports (COMFORT FIT MATERNITY SUPP LG) MISC 1 application by Does not apply route daily. 1 each 0 05/01/2020 at Unknown time  . Multiple Vitamin (MULTIVITAMIN WITH MINERALS) TABS tablet Take 1 tablet by mouth daily.   04/30/2020 at Unknown time    Review of Systems  Constitutional: Negative.  Negative for fatigue and fever.  HENT: Negative.   Respiratory: Negative.  Negative  for shortness of breath.   Cardiovascular: Negative.  Negative for chest pain.  Gastrointestinal: Negative.  Negative for abdominal pain, constipation, diarrhea, nausea and vomiting.  Genitourinary: Negative.  Negative for dysuria.  Neurological: Positive for light-headedness and headaches. Negative for dizziness.   Physical Exam   Blood pressure (!) 120/51, pulse (!) 115, temperature 98.3 F (36.8 C), temperature source Oral, resp. rate 16, last menstrual period 06/29/2019, SpO2 100 %, unknown if currently breastfeeding.  Patient Vitals for the past 24 hrs:  BP Temp Temp src Pulse Resp SpO2 Height Weight  05/01/20 1648 112/65 -- -- 96 -- -- -- --  05/01/20 1643 -- -- -- -- -- -- 5' 5"  (1.651 m) 102.1 kg  05/01/20 1635 -- -- -- -- -- 100 % -- --  05/01/20 1631 (!) 114/59 -- -- (!) 107 -- -- -- --  05/01/20 1630 -- -- -- -- -- 100 % -- --  05/01/20 1624 -- -- -- -- -- 100 % -- --  05/01/20 1623 (!) 120/51 98.3 F (36.8 C) Oral (!) 115 16 -- -- --   Physical Exam Vitals and nursing note reviewed.  Constitutional:      General: She is not in acute distress.    Appearance: She is well-developed.  HENT:     Head: Normocephalic.  Eyes:     Pupils: Pupils are equal, round, and reactive to light.  Cardiovascular:     Rate and Rhythm: Normal rate and regular rhythm.     Heart sounds: Normal heart sounds.  Pulmonary:     Effort: Pulmonary effort is normal. No respiratory distress.     Breath sounds: Normal breath sounds.  Abdominal:     General: Bowel sounds are normal. There is no distension.     Palpations: Abdomen is soft.     Tenderness: There is no abdominal tenderness.  Skin:    General: Skin is warm and dry.  Neurological:     Mental Status: She is alert and oriented to person, place, and time.  Psychiatric:        Behavior: Behavior normal.        Thought Content: Thought content normal.        Judgment: Judgment normal.    FHT: 150bpm  MAU Course   Procedures Results for orders placed or performed during the hospital encounter of 05/01/20 (from the past 24 hour(s))  Urinalysis, Routine w reflex microscopic     Status: Abnormal   Collection Time: 05/01/20  4:30 PM  Result Value Ref Range   Color, Urine YELLOW YELLOW   APPearance HAZY (A) CLEAR   Specific Gravity, Urine 1.013 1.005 - 1.030  pH 7.0 5.0 - 8.0   Glucose, UA NEGATIVE NEGATIVE mg/dL   Hgb urine dipstick NEGATIVE NEGATIVE   Bilirubin Urine NEGATIVE NEGATIVE   Ketones, ur NEGATIVE NEGATIVE mg/dL   Protein, ur NEGATIVE NEGATIVE mg/dL   Nitrite NEGATIVE NEGATIVE   Leukocytes,Ua TRACE (A) NEGATIVE   RBC / HPF 0-5 0 - 5 RBC/hpf   WBC, UA 0-5 0 - 5 WBC/hpf   Bacteria, UA RARE (A) NONE SEEN   Squamous Epithelial / LPF 6-10 0 - 5   Mucus PRESENT    Hyaline Casts, UA PRESENT    MDM UA Fioricet  Patient normotensive in MAU. Instructed patient to bring BP cuff to office visit this week. Reviewed pregnancy nutrition at length and encouraged increased PO hydration.  Assessment and Plan   1. Pregnancy headache in second trimester   2. History of gestational hypertension    -Discharge home in stable condition -Hypertension precautions discussed -Patient advised to follow-up with [facility] as scheduled this week for prenatal care -Patient may return to MAU as needed or if her condition were to change or worsen   [place] 05/01/2020, 4:34 PM

## 2020-05-01 NOTE — Discharge Instructions (Signed)

## 2020-05-01 NOTE — MAU Note (Signed)
Came to MAU via EMS.  Blood pressure has been high x 2 days.  Felt fatigue, seeing flashes of light, feels pain in ears, having a headache now, all day.  Taking extra-strength tylenol and it is not working.   Blood pressures at home:  7-9:  164/86, 143/89, 118/61, 138/99  Today, 122/72, 118/86, 149/78, 144/78, 151/66, 144/74.    Takes low dose Asprin. No vaginal bleeding. Wears a maternity belt for ongoing pelvic joint discomfort.

## 2020-05-04 ENCOUNTER — Other Ambulatory Visit: Payer: Self-pay

## 2020-05-04 ENCOUNTER — Ambulatory Visit (INDEPENDENT_AMBULATORY_CARE_PROVIDER_SITE_OTHER): Payer: Medicaid Other | Admitting: Obstetrics and Gynecology

## 2020-05-04 VITALS — BP 118/77 | HR 98 | Wt 223.0 lb

## 2020-05-04 DIAGNOSIS — Z8751 Personal history of pre-term labor: Secondary | ICD-10-CM

## 2020-05-04 DIAGNOSIS — Z6837 Body mass index (BMI) 37.0-37.9, adult: Secondary | ICD-10-CM | POA: Insufficient documentation

## 2020-05-04 DIAGNOSIS — O99212 Obesity complicating pregnancy, second trimester: Secondary | ICD-10-CM

## 2020-05-04 DIAGNOSIS — Z8759 Personal history of other complications of pregnancy, childbirth and the puerperium: Secondary | ICD-10-CM

## 2020-05-04 DIAGNOSIS — M549 Dorsalgia, unspecified: Secondary | ICD-10-CM

## 2020-05-04 DIAGNOSIS — Z3A21 21 weeks gestation of pregnancy: Secondary | ICD-10-CM

## 2020-05-04 DIAGNOSIS — O09892 Supervision of other high risk pregnancies, second trimester: Secondary | ICD-10-CM

## 2020-05-04 DIAGNOSIS — O09899 Supervision of other high risk pregnancies, unspecified trimester: Secondary | ICD-10-CM

## 2020-05-04 DIAGNOSIS — Z641 Problems related to multiparity: Secondary | ICD-10-CM

## 2020-05-04 DIAGNOSIS — E669 Obesity, unspecified: Secondary | ICD-10-CM

## 2020-05-04 DIAGNOSIS — O99210 Obesity complicating pregnancy, unspecified trimester: Secondary | ICD-10-CM

## 2020-05-04 DIAGNOSIS — O99891 Other specified diseases and conditions complicating pregnancy: Secondary | ICD-10-CM | POA: Insufficient documentation

## 2020-05-04 NOTE — Progress Notes (Signed)
   PRENATAL VISIT NOTE  Subjective:  [name] is a 24 y.o. [phone] at [redacted]w[redacted]d being seen today for ongoing prenatal care.  She is currently monitored for the following issues for this low-risk pregnancy and has Incisional hernia; Supervision of other high risk pregnancy, antepartum; Grand multipara; History of preterm delivery; History of gestational hypertension; Chlamydia infection during pregnancy; Obesity in pregnancy; BMI 37.0-37.9, adult; and Back pain affecting pregnancy in second trimester on their problem list.  Patient doing well with no acute concerns today. She reports backache.  Contractions: Irritability. Vag. Bleeding: None.  Movement: Present. Denies leaking of fluid.   She is currently taking Makena injections on Wednesday  The following portions of the patient's history were reviewed and updated as appropriate: allergies, current medications, past family history, past medical history, past social history, past surgical history and problem list. Problem list updated.  Objective:   Vitals:   05/04/20 1057  BP: 118/77  Pulse: 98  Weight: 223 lb (101.2 kg)    Fetal Status: Fetal Heart Rate (bpm): 153   Movement: Present     General:  Alert, oriented and cooperative. Patient is in no acute distress.  Skin: Skin is warm and dry. No rash noted.   Cardiovascular: Normal heart rate noted  Respiratory: Normal respiratory effort, no problems with respiration noted  Abdomen: Soft, gravid, appropriate for gestational age.  Pain/Pressure: Present     Pelvic: Cervical exam deferred        Extremities: Normal range of motion.  Edema: Trace  Mental Status:  Normal mood and affect. Normal behavior. Normal judgment and thought content.   Assessment and Plan:  Pregnancy: [id number] at [redacted]w[redacted]d  1. Supervision of other high risk pregnancy, antepartum  - AFP, Serum, Open Spina Bifida  2. Grand multipara   3. History of preterm delivery Weekly makena on Wednesday  4.  History of gestational hypertension Normal BP today  5. Obesity in pregnancy   6. BMI 37.0-37.9, adult   7. Back pain affecting pregnancy in second trimester  - Ambulatory referral to Physical Therapy  Preterm labor symptoms and general obstetric precautions including but not limited to vaginal bleeding, contractions, leaking of fluid and fetal movement were reviewed in detail with the patient.  Please refer to After Visit Summary for other counseling recommendations.   Return in about 4 weeks (around 06/01/2020) for [facility], in person.   Mariel Aloe, MD

## 2020-05-04 NOTE — Progress Notes (Signed)
ROB, she received her Makena by [employer] yesterday, she administers the injections on Wednesday.  Patient was instructed on how to keep up with her refills, told to call the number on her Rx .

## 2020-05-04 NOTE — Patient Instructions (Signed)
Preventing Preterm Birth Preterm birth is when your baby is delivered between 20 weeks and 37 weeks of pregnancy. A full-term pregnancy lasts for at least 37 weeks. Preterm birth can be dangerous for your baby because the last few weeks of pregnancy are an important time for your baby's brain and lungs to grow. Many things can cause a baby to be born early. Sometimes the cause is not known. There are certain factors that make you more likely to experience preterm birth, such as:  Having a previous baby born preterm.  Being pregnant with twins or other multiples.  Having had fertility treatment.  Being overweight or underweight at the start of your pregnancy.  Having any of the following during pregnancy: ? An infection, including a urinary tract infection (UTI) or an STI (sexually transmitted infection). ? High blood pressure. ? Diabetes. ? Vaginal bleeding.  Being age 35 or older.  Being age 18 or younger.  Getting pregnant within 6 months of a previous pregnancy.  Suffering extreme stress or physical or emotional abuse during pregnancy.  Standing for long periods of time during pregnancy, such as working at a job that requires standing. What are the risks? The most serious risk of preterm birth is that the baby may not survive. This is more likely to happen if a baby is born before 34 weeks. Other risks and complications of preterm birth may include your baby having:  Breathing problems.  Brain damage that affects movement and coordination (cerebral palsy).  Feeding difficulties.  Vision or hearing problems.  Infections or inflammation of the digestive tract (colitis).  Developmental delays.  Learning disabilities.  Higher risk for diabetes, heart disease, and high blood pressure later in life. What can I do to lower my risk?  Medical care The most important thing you can do to lower your risk for preterm birth is to get routine medical care during pregnancy (prenatal  care). If you have a high risk of preterm birth, you may be referred to a health care provider who specializes in managing high-risk pregnancies (perinatologist). You may be given medicine to help prevent preterm birth. Lifestyle changes Certain lifestyle changes can also lower your risk of preterm birth:  Wait at least 6 months after a pregnancy to become pregnant again.  Try to plan pregnancy for when you are between 19 and 35 years old.  Get to a healthy weight before getting pregnant. If you are overweight, work with your health care provider to safely lose weight.  Do not use any products that contain nicotine or tobacco, such as cigarettes and e-cigarettes. If you need help quitting, ask your health care provider.  Do not drink alcohol.  Do not use drugs. Where to find support For more support, consider:  Talking with your health care provider.  Talking with a therapist or substance abuse counselor, if you need help quitting.  Working with a diet and nutrition specialist (dietitian) or a personal trainer to maintain a healthy weight.  Joining a support group. Where to find more information Learn more about preventing preterm birth from:  Centers for Disease Control and Prevention: cdc.gov/reproductivehealth/maternalinfanthealth/pretermbirth.htm  March of Dimes: marchofdimes.org/complications/premature-babies.aspx  American Pregnancy Association: americanpregnancy.org/labor-and-birth/premature-labor Contact a health care provider if:  You have any of the following signs of preterm labor before 37 weeks: ? A change or increase in vaginal discharge. ? Fluid leaking from your vagina. ? Pressure or cramps in your lower abdomen. ? A backache that does not go away or gets worse. ?   Regular tightening (contractions) in your lower abdomen. Summary  Preterm birth means having your baby during weeks 20-37 of pregnancy.  Preterm birth may put your baby at risk for physical and  mental problems.  Getting good prenatal care can help prevent preterm birth.  You can lower your risk of preterm birth by making certain lifestyle changes, such as not smoking and not using alcohol. This information is not intended to replace advice given to you by your health care provider. Make sure you discuss any questions you have with your health care provider. Document Revised: 09/21/2017 Document Reviewed: 06/17/2016 Elsevier Patient Education  2020 [employer]. Second Trimester of Pregnancy  The second trimester is from week 14 through week 27 (month 4 through 6). This is often the time in pregnancy that you feel your best. Often times, morning sickness has lessened or quit. You may have more energy, and you may get hungry more often. Your unborn baby is growing rapidly. At the end of the sixth month, he or she is about 9 inches long and weighs about 1 pounds. You will likely feel the baby move between 18 and 20 weeks of pregnancy. Follow these instructions at home: Medicines  Take over-the-counter and prescription medicines only as told by your doctor. Some medicines are safe and some medicines are not safe during pregnancy.  Take a prenatal vitamin that contains at least 600 micrograms (mcg) of folic acid.  If you have trouble pooping (constipation), take medicine that will make your stool soft (stool softener) if your doctor approves. Eating and drinking   Eat regular, healthy meals.  Avoid raw meat and uncooked cheese.  If you get low calcium from the food you eat, talk to your doctor about taking a daily calcium supplement.  Avoid foods that are high in fat and sugars, such as fried and sweet foods.  If you feel sick to your stomach (nauseous) or throw up (vomit): ? Eat 4 or 5 small meals a day instead of 3 large meals. ? Try eating a few soda crackers. ? Drink liquids between meals instead of during meals.  To prevent constipation: ? Eat foods that are high in  fiber, like fresh fruits and vegetables, whole grains, and beans. ? Drink enough fluids to keep your pee (urine) clear or pale yellow. Activity  Exercise only as told by your doctor. Stop exercising if you start to have cramps.  Do not exercise if it is too hot, too humid, or if you are in a place of great height (high altitude).  Avoid heavy lifting.  Wear low-heeled shoes. Sit and stand up straight.  You can continue to have sex unless your doctor tells you not to. Relieving pain and discomfort  Wear a good support bra if your breasts are tender.  Take warm water baths (sitz baths) to soothe pain or discomfort caused by hemorrhoids. Use hemorrhoid cream if your doctor approves.  Rest with your legs raised if you have leg cramps or low back pain.  If you develop puffy, bulging veins (varicose veins) in your legs: ? Wear support hose or compression stockings as told by your doctor. ? Raise (elevate) your feet for 15 minutes, 3-4 times a day. ? Limit salt in your food. Prenatal care  Write down your questions. Take them to your prenatal visits.  Keep all your prenatal visits as told by your doctor. This is important. Safety  Wear your seat belt when driving.  Make a list of emergency phone  numbers, including numbers for family, friends, the hospital, and police and fire departments. General instructions  Ask your doctor about the right foods to eat or for help finding a counselor, if you need these services.  Ask your doctor about local prenatal classes. Begin classes before month 6 of your pregnancy.  Do not use hot tubs, steam rooms, or saunas.  Do not douche or use tampons or scented sanitary pads.  Do not cross your legs for long periods of time.  Visit your dentist if you have not done so. Use a soft toothbrush to brush your teeth. Floss gently.  Avoid all smoking, herbs, and alcohol. Avoid drugs that are not approved by your doctor.  Do not use any products  that contain nicotine or tobacco, such as cigarettes and e-cigarettes. If you need help quitting, ask your doctor.  Avoid cat litter boxes and soil used by cats. These carry germs that can cause birth defects in the baby and can cause a loss of your baby (miscarriage) or stillbirth. Contact a doctor if:  You have mild cramps or pressure in your lower belly.  You have pain when you pee (urinate).  You have bad smelling fluid coming from your vagina.  You continue to feel sick to your stomach (nauseous), throw up (vomit), or have watery poop (diarrhea).  You have a nagging pain in your belly area.  You feel dizzy. Get help right away if:  You have a fever.  You are leaking fluid from your vagina.  You have spotting or bleeding from your vagina.  You have severe belly cramping or pain.  You lose or gain weight rapidly.  You have trouble catching your breath and have chest pain.  You notice sudden or extreme puffiness (swelling) of your face, hands, ankles, feet, or legs.  You have not felt the baby move in over an hour.  You have severe headaches that do not go away when you take medicine.  You have trouble seeing. Summary  The second trimester is from week 14 through week 27 (months 4 through 6). This is often the time in pregnancy that you feel your best.  To take care of yourself and your unborn baby, you will need to eat healthy meals, take medicines only if your doctor tells you to do so, and do activities that are safe for you and your baby.  Call your doctor if you get sick or if you notice anything unusual about your pregnancy. Also, call your doctor if you need help with the right food to eat, or if you want to know what activities are safe for you. This information is not intended to replace advice given to you by your health care provider. Make sure you discuss any questions you have with your health care provider. Document Revised: 01/31/2019 Document Reviewed:  11/14/2016 Elsevier Patient Education  2020 [employer].

## 2020-05-06 ENCOUNTER — Other Ambulatory Visit: Payer: Self-pay

## 2020-05-06 LAB — AFP, SERUM, OPEN SPINA BIFIDA
AFP MoM: 1.66
AFP Value: 86.4 ng/mL
Gest. Age on Collection Date: 21 weeks
Maternal Age At EDD: 25.2 yr
OSBR Risk 1 IN: 1802
Test Results:: NEGATIVE
Weight: 223 [lb_av]

## 2020-05-10 ENCOUNTER — Other Ambulatory Visit: Payer: Self-pay

## 2020-05-10 DIAGNOSIS — O99891 Other specified diseases and conditions complicating pregnancy: Secondary | ICD-10-CM

## 2020-05-10 MED ORDER — CYCLOBENZAPRINE HCL 10 MG PO TABS: 10.0000 mg | ORAL_TABLET | Freq: Three times a day (TID) | ORAL | 0 refills | Status: DC | PRN

## 2020-05-10 NOTE — Progress Notes (Signed)
Ok to refill per Donette Larry CNM.  Rx refilled.  Message sent to pt to make aware.

## 2020-05-11 ENCOUNTER — Telehealth: Payer: Self-pay

## 2020-05-11 NOTE — Telephone Encounter (Signed)
Pt called and reports skin reaction to makena injections. Pt c/o hardened and itchy "lump" on arm where she gives herself the subq injections. I advised pt to call [facility] to see if she could switch to the IM injections instead and to let us know if they need [place] to send in another order for this. Also advised pt she could take benadryl for the itching and tylenol for pain, pt verbalizes understanding.

## 2020-05-13 ENCOUNTER — Other Ambulatory Visit: Payer: Self-pay

## 2020-05-13 NOTE — Progress Notes (Signed)
Rx for syringes and needles called in to [facility] pharmacy for pts makena injections that switched to IM.

## 2020-05-20 ENCOUNTER — Other Ambulatory Visit (INDEPENDENT_AMBULATORY_CARE_PROVIDER_SITE_OTHER): Payer: Medicaid Other

## 2020-05-20 ENCOUNTER — Other Ambulatory Visit: Payer: Self-pay

## 2020-05-20 DIAGNOSIS — O09899 Supervision of other high risk pregnancies, unspecified trimester: Secondary | ICD-10-CM

## 2020-05-20 LAB — POCT CBG (FASTING - GLUCOSE)-MANUAL ENTRY: Glucose Fasting, POC: 71 mg/dL (ref 70–99)

## 2020-05-21 LAB — HEMOGLOBIN A1C
Est. average glucose Bld gHb Est-mCnc: 85 mg/dL
Hgb A1c MFr Bld: 4.6 % — ABNORMAL LOW (ref 4.8–5.6)

## 2020-05-27 ENCOUNTER — Other Ambulatory Visit: Payer: Self-pay

## 2020-05-27 ENCOUNTER — Telehealth: Payer: Self-pay

## 2020-05-27 ENCOUNTER — Inpatient Hospital Stay (HOSPITAL_COMMUNITY)
Admission: AD | Admit: 2020-05-27 | Discharge: 2020-05-27 | Disposition: A | Discharge status: Home / Self Care | Payer: Medicaid Other | Attending: Family Medicine | Admitting: Family Medicine

## 2020-05-27 ENCOUNTER — Encounter (HOSPITAL_COMMUNITY): Payer: Self-pay | Admitting: Family Medicine

## 2020-05-27 DIAGNOSIS — E669 Obesity, unspecified: Secondary | ICD-10-CM | POA: Diagnosis not present

## 2020-05-27 DIAGNOSIS — Z7982 Long term (current) use of aspirin: Secondary | ICD-10-CM | POA: Diagnosis not present

## 2020-05-27 DIAGNOSIS — O0942 Supervision of pregnancy with grand multiparity, second trimester: Secondary | ICD-10-CM | POA: Insufficient documentation

## 2020-05-27 DIAGNOSIS — N189 Chronic kidney disease, unspecified: Secondary | ICD-10-CM | POA: Insufficient documentation

## 2020-05-27 DIAGNOSIS — O99212 Obesity complicating pregnancy, second trimester: Secondary | ICD-10-CM | POA: Insufficient documentation

## 2020-05-27 DIAGNOSIS — O26892 Other specified pregnancy related conditions, second trimester: Secondary | ICD-10-CM

## 2020-05-27 DIAGNOSIS — O26852 Spotting complicating pregnancy, second trimester: Secondary | ICD-10-CM | POA: Insufficient documentation

## 2020-05-27 DIAGNOSIS — O26832 Pregnancy related renal disease, second trimester: Secondary | ICD-10-CM | POA: Insufficient documentation

## 2020-05-27 DIAGNOSIS — N898 Other specified noninflammatory disorders of vagina: Secondary | ICD-10-CM | POA: Diagnosis not present

## 2020-05-27 DIAGNOSIS — Z3A24 24 weeks gestation of pregnancy: Secondary | ICD-10-CM | POA: Diagnosis not present

## 2020-05-27 DIAGNOSIS — O4702 False labor before 37 completed weeks of gestation, second trimester: Secondary | ICD-10-CM | POA: Diagnosis present

## 2020-05-27 DIAGNOSIS — O479 False labor, unspecified: Secondary | ICD-10-CM

## 2020-05-27 DIAGNOSIS — O09292 Supervision of pregnancy with other poor reproductive or obstetric history, second trimester: Secondary | ICD-10-CM | POA: Diagnosis not present

## 2020-05-27 DIAGNOSIS — Z8759 Personal history of other complications of pregnancy, childbirth and the puerperium: Secondary | ICD-10-CM | POA: Insufficient documentation

## 2020-05-27 LAB — URINALYSIS, ROUTINE W REFLEX MICROSCOPIC
Bilirubin Urine: NEGATIVE
Glucose, UA: NEGATIVE mg/dL
Hgb urine dipstick: NEGATIVE
Ketones, ur: 5 mg/dL — AB
Nitrite: NEGATIVE
Protein, ur: NEGATIVE mg/dL
Specific Gravity, Urine: 1.015 (ref 1.005–1.030)
pH: 7 (ref 5.0–8.0)

## 2020-05-27 LAB — WET PREP, GENITAL
Clue Cells Wet Prep HPF POC: NONE SEEN
Sperm: NONE SEEN
Trich, Wet Prep: NONE SEEN
Yeast Wet Prep HPF POC: NONE SEEN

## 2020-05-27 NOTE — [facility] Note (Signed)
Pt presents to [facility] with c/o ctx that started yesterday, she has had mucous discharge since yesterday and VB x 2 days. Describes bleeding as light pink spotting. She is also stating that her baby has not been moving as much since yesterday.

## 2020-05-27 NOTE — Discharge Instructions (Signed)
Braxton Hicks Contractions °Contractions of the uterus can occur throughout pregnancy, but they are not always a sign that you are in labor. You may have practice contractions called Braxton Hicks contractions. These false labor contractions are sometimes confused with true labor. °What are Braxton Hicks contractions? °Braxton Hicks contractions are tightening movements that occur in the muscles of the uterus before labor. Unlike true labor contractions, these contractions do not result in opening (dilation) and thinning of the cervix. Toward the end of pregnancy (32-34 weeks), Braxton Hicks contractions can happen more often and may become stronger. These contractions are sometimes difficult to tell apart from true labor because they can be very uncomfortable. You should not feel embarrassed if you go to the hospital with false labor. °Sometimes, the only way to tell if you are in true labor is for your health care provider to look for changes in the cervix. The health care provider will do a physical exam and may monitor your contractions. If you are not in true labor, the exam should show that your cervix is not dilating and your water has not broken. °If there are no other health problems associated with your pregnancy, it is completely safe for you to be sent home with false labor. You may continue to have Braxton Hicks contractions until you go into true labor. °How to tell the difference between true labor and false labor °True labor °· Contractions last 30-70 seconds. °· Contractions become very regular. °· Discomfort is usually felt in the top of the uterus, and it spreads to the lower abdomen and low back. °· Contractions do not go away with walking. °· Contractions usually become more intense and increase in frequency. °· The cervix dilates and gets thinner. °False labor °· Contractions are usually shorter and not as strong as true labor contractions. °· Contractions are usually irregular. °· Contractions  are often felt in the front of the lower abdomen and in the groin. °· Contractions may go away when you walk around or change positions while lying down. °· Contractions get weaker and are shorter-lasting as time goes on. °· The cervix usually does not dilate or become thin. °Follow these instructions at home: ° °· Take over-the-counter and prescription medicines only as told by your health care provider. °· Keep up with your usual exercises and follow other instructions from your health care provider. °· Eat and drink lightly if you think you are going into labor. °· If Braxton Hicks contractions are making you uncomfortable: °? Change your position from lying down or resting to walking, or change from walking to resting. °? Sit and rest in a tub of warm water. °? Drink enough fluid to keep your urine pale yellow. Dehydration may cause these contractions. °? Do slow and deep breathing several times an hour. °· Keep all follow-up prenatal visits as told by your health care provider. This is important. °Contact a health care provider if: °· You have a fever. °· You have continuous pain in your abdomen. °Get help right away if: °· Your contractions become stronger, more regular, and closer together. °· You have fluid leaking or gushing from your vagina. °· You pass blood-tinged mucus (bloody show). °· You have bleeding from your vagina. °· You have low back pain that you never had before. °· You feel your baby’s head pushing down and causing pelvic pressure. °· Your baby is not moving inside you as much as it used to. °Summary °· Contractions that occur before labor are   called Braxton Hicks contractions, false labor, or practice contractions. °· Braxton Hicks contractions are usually shorter, weaker, farther apart, and less regular than true labor contractions. True labor contractions usually become progressively stronger and regular, and they become more frequent. °· Manage discomfort from Braxton Hicks contractions  by changing position, resting in a warm bath, drinking plenty of water, or practicing deep breathing. °This information is not intended to replace advice given to you by your health care provider. Make sure you discuss any questions you have with your health care provider. °Document Revised: 09/21/2017 Document Reviewed: 02/22/2017 °Elsevier Patient Education © 2020 [employer]vier Inc. ° °

## 2020-05-27 NOTE — Telephone Encounter (Signed)
Pt calleding with increased Pelvic/vaginal pressure and notes discomfort with walking.  Pt believes she has lost mucus plug. Notes a little vaginal bleeding no leaking and decreased FM. Pt advised to go to [facility] for an evaluation Pt stated she will need transportation.  I contacted scheduling to help with medicaid services to provide pt with a ride.

## 2020-05-27 NOTE — MAU Provider Note (Signed)
History     [id number]  Arrival date and time: 05/27/20 1144    Chief Complaint  Patient presents with  . Vaginal Discharge  . Contractions     HPI [name] is a 24 y.o. at 50w3dby 9w u/s with PMHx notable for grand multiparity, gHTN in prior pregnancy, chlamydia during pregnancy (neg TOC 03/24/20), obesity, multiple preterm deliveries, who presents for evaluation of intermittent contractions starting yesterday. She states contractions are irregular and infrequent. She has had some vaginal discharge as well, denies odor. Some pink vaginal spotting. No LOF. No vision changes, headaches, sob, cp, LE edema. No n/v/d/c.   Reviewed all prior office visit progress notes and telephone encounters, problem list, med list.  OB History    Gravida  8   Para  4   Term  1   Preterm  3   AB  3   Living  4     SAB  3   TAB  0   Ectopic  0   Multiple  0   Live Births  4           Past Medical History:  Diagnosis Date  . Anxiety   . Chlamydia 2018, 2021  . Chronic kidney disease    kidney stones and kidney infection with last pregnancy  . Depression   . Gonorrhea 2018  . Incisional hernia 04/2013  . LGSIL on Pap smear of cervix 2019  . Urethral diverticulum     Past Surgical History:  Procedure Laterality Date  . CHOLECYSTECTOMY  04/03/2012   Procedure: LAPAROSCOPIC CHOLECYSTECTOMY WITH INTRAOPERATIVE CHOLANGIOGRAM;  Surgeon: TEarnstine Regal MD;  Location: [facility];  Service: General;  Laterality: N/A;  . ERCP  03/12/2012   Procedure: ENDOSCOPIC RETROGRADE CHOLANGIOPANCREATOGRAPHY (ERCP);  Surgeon: MJeryl Columbia MD;  [facility]  Service: Endoscopy;  Laterality: N/A;  . INGUINAL HERNIA REPAIR N/A 05/15/2013   Procedure: HERNIA REPAIR INCISIONAL;  Surgeon: TEarnstine Regal MD;  [facility]  Service: General;  Laterality: N/A;  . INSERTION OF MESH N/A 05/15/2013   Procedure: INSERTION OF MESH;  Surgeon: TEarnstine Regal MD;  [place]  [facility]  Service: General;  Laterality: N/A;  . [place]RETERAL [place]   . WISDOM TOOTH EXTRACTION      Family History  Problem Relation Age of Onset  . Hypertension Mother   . Hypertension Father   . Diabetes Paternal Grandmother   . Cancer Paternal Grandfather   . Seizures Paternal Grandfather     Social History   Socioeconomic History  . Marital status: Legally Separated    Spouse name: Not on file  . Number of children: Not on file  . Years of education: Not on file  . Highest education level: Not on file  Occupational History  . Not on file  Tobacco Use  . Smoking status: Never Smoker  . Smokeless tobacco: Never Used  Vaping Use  . Vaping Use: Never used  Substance and Sexual Activity  . Alcohol use: Not Currently    Comment: last drink 2 yrs ago  . Drug use: No  . Sexual activity: Not Currently    Birth control/protection: None  Other Topics Concern  . Not on file  Social History Narrative  . Not on file   Social Determinants of Health   Financial Resource Strain:   . Difficulty of Paying Living Expenses:   Food Insecurity:   . Worried About Running  Out of Food in the Last Year:   . [place] in the Last Year:   Transportation Needs:   . Lack of Transportation (Medical):   Marland Kitchen Lack of Transportation (Non-Medical):   Physical Activity:   . Days of Exercise per Week:   . Minutes of Exercise per Session:   Stress:   . Feeling of Stress :   Social Connections:   . Frequency of Communication with Friends and Family:   . Frequency of Social Gatherings with Friends and Family:   . Attends Religious Services:   . Active Member of Clubs or Organizations:   . Attends Archivist Meetings:   Marland Kitchen Marital Status:   Intimate Partner Violence:   . Fear of Current or Ex-Partner:   . Emotionally Abused:   Marland Kitchen Physically Abused:   . Sexually Abused:     Allergies  Allergen Reactions  . Penicillins Anaphylaxis, Itching,  Swelling, Rash and Other (See Comments)    Has patient had a PCN reaction causing immediate rash, facial/tongue/throat swelling, SOB or lightheadedness with hypotension: Yes Has patient had a PCN reaction causing severe rash involving mucus membranes or skin necrosis: No Has patient had a PCN reaction that required hospitalization: No Has patient had a PCN reaction occurring within the last 10 years: No If all of the above answers are "NO", then may proceed with Cephalosporin use.  And swelling    No current facility-administered medications on file prior to encounter.   Current Outpatient Medications on File Prior to Encounter  Medication Sig Dispense Refill  . aspirin EC 81 MG tablet Take 1 tablet (81 mg total) by mouth daily. Swallow whole. 30 tablet 11  . Blood Pressure Monitor KIT 1 Device by Does not apply route once a week. To be monitored Regularly at home. 1 kit 0  . cyclobenzaprine (FLEXERIL) 10 MG tablet Take 1 tablet (10 mg total) by mouth every 8 (eight) hours as needed (back pain). 30 tablet 0  . Elastic Bandages & Supports (COMFORT FIT MATERNITY SUPP LG) MISC 1 application by Does not apply route daily. 1 each 0  . Multiple Vitamin (MULTIVITAMIN WITH MINERALS) TABS tablet Take 1 tablet by mouth daily.       ROS Pertinent positives and negative per HPI, all others reviewed and negative  Physical Exam   BP (!) 104/55 (BP Location: Right Arm)   Pulse (!) 102   Temp 98.2 F (36.8 C) (Oral)   Resp 18   Ht 5' 5"  (1.651 m)   Wt 102.5 kg   LMP 06/29/2019   SpO2 99%   BMI 37.61 kg/m   Physical Exam Vitals and nursing note reviewed. Exam conducted with a chaperone present.  Constitutional:      General: She is not in acute distress.    Appearance: Normal appearance. She is obese. She is not ill-appearing or toxic-appearing.  HENT:     Head: Normocephalic and atraumatic.     Nose: Nose normal.     Mouth/Throat:     Mouth: Mucous membranes are moist.     Pharynx:  Oropharynx is clear.  Eyes:     Extraocular Movements: Extraocular movements intact.     Conjunctiva/sclera: Conjunctivae normal.  Cardiovascular:     Rate and Rhythm: Normal rate.     Pulses: Normal pulses.  Pulmonary:     Effort: Pulmonary effort is normal.  Abdominal:     Palpations: Abdomen is soft.     Tenderness: There is  no abdominal tenderness. There is no guarding or rebound.     Comments: Gravid   Genitourinary:    General: Normal vulva.     Comments: Cervical os closed. Normal appearing cervix. No blood noted on exam. Some white vaginal discharge noted. No cervical motion tenderness.  Musculoskeletal:        General: Normal range of motion.     Cervical back: Normal range of motion and neck supple.  Skin:    General: Skin is warm and dry.  Neurological:     General: No focal deficit present.     Mental Status: She is alert and oriented to person, place, and time. Mental status is at baseline.  Psychiatric:        Mood and Affect: Mood normal.        Behavior: Behavior normal.     Cervical Exam  As above  Bedside Ultrasound Not indicated  My interpretation: n/a  FHT Baseline 150bpm, moderate variability, +accels, no decels Toco: quiet Cat: 1  Labs Results for orders placed or performed during the hospital encounter of 05/27/20 (from the past 24 hour(s))  Urinalysis, Routine w reflex microscopic Urine, Clean Catch     Status: Abnormal   Collection Time: 05/27/20 12:19 PM  Result Value Ref Range   Color, Urine AMBER (A) YELLOW   APPearance CLOUDY (A) CLEAR   Specific Gravity, Urine 1.015 1.005 - 1.030   pH 7.0 5.0 - 8.0   Glucose, UA NEGATIVE NEGATIVE mg/dL   Hgb urine dipstick NEGATIVE NEGATIVE   Bilirubin Urine NEGATIVE NEGATIVE   Ketones, ur 5 (A) NEGATIVE mg/dL   Protein, ur NEGATIVE NEGATIVE mg/dL   Nitrite NEGATIVE NEGATIVE   Leukocytes,Ua SMALL (A) NEGATIVE   RBC / HPF 0-5 0 - 5 RBC/hpf   WBC, UA 11-20 0 - 5 WBC/hpf   Bacteria, UA MANY (A)  NONE SEEN   Squamous Epithelial / LPF 21-50 0 - 5   Mucus PRESENT   Wet prep, genital     Status: Abnormal   Collection Time: 05/27/20 12:45 PM   Specimen: Vaginal  Result Value Ref Range   Yeast Wet Prep HPF POC NONE SEEN NONE SEEN   Trich, Wet Prep NONE SEEN NONE SEEN   Clue Cells Wet Prep HPF POC NONE SEEN NONE SEEN   WBC, Wet Prep HPF POC FEW (A) NONE SEEN   Sperm NONE SEEN     Imaging No results found.  MAU Course  Procedures  Lab Orders     Wet prep, genital     Urinalysis, Routine w reflex microscopic Urine, Clean Catch No orders of the defined types were placed in this encounter.  Imaging Orders  No imaging studies ordered today    MDM moderate  Assessment and Plan  23yo [id number] at 38w3dpresents to MAU for evaluation of contractions.  #False labor #Vaginal discharge of pregnancy Patient presents with intermittent, irregular contractions since yesterday with some associated vaginal discharge. She additionally had some vaginal spotting. NST reactive as noted above, with category 1 strip, no contractions noted. VSS. Wet prep unremarkable, gcc pending. Suspect patient is having braxton-hicks contractions, do not suspect preterm labor at this time. Patient given strict return precautions should she have increased frequency of regular contractions, increased VB, or LOF. She has an upcoming appt next week at her OBGYN. Patient amendable with plan.  Dispo: discharge home  #FWB FHT Cat 1 NST: reactive  ARoxanne Gates MD OB Fellow, Faculty  [place] [facility] [place] 05/27/2020 1:16 PM

## 2020-05-28 LAB — GC/CHLAMYDIA PROBE AMP (~~LOC~~) NOT AT ARMC
Chlamydia: NEGATIVE
Comment: NEGATIVE
Comment: NORMAL
Neisseria Gonorrhea: NEGATIVE

## 2020-06-01 ENCOUNTER — Other Ambulatory Visit: Payer: Self-pay

## 2020-06-01 ENCOUNTER — Ambulatory Visit (INDEPENDENT_AMBULATORY_CARE_PROVIDER_SITE_OTHER): Payer: Medicaid Other | Admitting: Obstetrics and Gynecology

## 2020-06-01 ENCOUNTER — Encounter: Payer: Self-pay | Admitting: Obstetrics and Gynecology

## 2020-06-01 VITALS — BP 103/67 | HR 93 | Wt 223.0 lb

## 2020-06-01 DIAGNOSIS — Z8751 Personal history of pre-term labor: Secondary | ICD-10-CM

## 2020-06-01 DIAGNOSIS — K432 Incisional hernia without obstruction or gangrene: Secondary | ICD-10-CM

## 2020-06-01 DIAGNOSIS — O98819 Other maternal infectious and parasitic diseases complicating pregnancy, unspecified trimester: Secondary | ICD-10-CM

## 2020-06-01 DIAGNOSIS — Z8759 Personal history of other complications of pregnancy, childbirth and the puerperium: Secondary | ICD-10-CM

## 2020-06-01 DIAGNOSIS — O09899 Supervision of other high risk pregnancies, unspecified trimester: Secondary | ICD-10-CM

## 2020-06-01 DIAGNOSIS — A749 Chlamydial infection, unspecified: Secondary | ICD-10-CM

## 2020-06-01 NOTE — Progress Notes (Signed)
HOB c/o 17P  Injection makes her feel sick; it causes NV, lightheadedness, contractions, swollen ankles.  Her BP Cuff is not working properly and she forgot to take it to [place].

## 2020-06-01 NOTE — Progress Notes (Signed)
   PRENATAL VISIT NOTE  Subjective:  [name] is a 24 y.o. [phone] at [redacted]w[redacted]d being seen today for ongoing prenatal care.  She is currently monitored for the following issues for this high-risk pregnancy and has Incisional hernia; Supervision of other high risk pregnancy, antepartum; Grand multipara; History of preterm delivery; History of gestational hypertension; Chlamydia infection during pregnancy; Obesity in pregnancy; BMI 37.0-37.9, adult; and Back pain affecting pregnancy in second trimester on their problem list.  Patient reports no complaints.  Contractions: Irregular. Vag. Bleeding: None.  Movement: Present. Denies leaking of fluid.   The following portions of the patient's history were reviewed and updated as appropriate: allergies, current medications, past family history, past medical history, past social history, past surgical history and problem list.   Objective:   Vitals:   06/01/20 1113  BP: 103/67  Pulse: 93  Weight: 223 lb (101.2 kg)    Fetal Status: Fetal Heart Rate (bpm): 148 Fundal Height: 26 cm Movement: Present     General:  Alert, oriented and cooperative. Patient is in no acute distress.  Skin: Skin is warm and dry. No rash noted.   Cardiovascular: Normal heart rate noted  Respiratory: Normal respiratory effort, no problems with respiration noted  Abdomen: Soft, gravid, appropriate for gestational age.  Pain/Pressure: Present     Pelvic: Cervical exam deferred        Extremities: Normal range of motion.  Edema: Trace  Mental Status: Normal mood and affect. Normal behavior. Normal judgment and thought content.   Assessment and Plan:  Pregnancy: [id number] at [redacted]w[redacted]d 1. Supervision of other high risk pregnancy, antepartum - Patient for 2hr Gtt at next appointment.    2. History of preterm delivery - Preterm labor precautions discussed. - Patient to continue 17P  3. History of gestational hypertension - PreEclampsia precautions given. - Patient  scheduled for growth scan in 2 weeks.    4. Incisional hernia, without obstruction or gangrene - Abdominal pain, nausea and vomiting precautions given.  5. Chlamydia infection during pregnancy - Test of cure completed. - Condom use encouraged.  Preterm labor symptoms and general obstetric precautions including but not limited to vaginal bleeding, contractions, leaking of fluid and fetal movement were reviewed in detail with the patient. Please refer to After Visit Summary for other counseling recommendations.   Return in about 2 weeks (around 06/15/2020) for HROB with 2hr GTT.  Future Appointments  Date Time Provider Department Center  06/15/2020 10:30 AM [facility] NURSE [facility]  06/15/2020 10:45 AM [facility]  06/15/2020  2:00 PM Johnny Bridge, MD [facility] None    Johnny Bridge, MD

## 2020-06-01 NOTE — Patient Instructions (Signed)

## 2020-06-09 ENCOUNTER — Inpatient Hospital Stay (HOSPITAL_COMMUNITY)
Admission: AD | Admit: 2020-06-09 | Discharge: 2020-06-09 | Disposition: A | Discharge status: Home / Self Care | Payer: Medicaid Other | Attending: Obstetrics and Gynecology | Admitting: Obstetrics and Gynecology

## 2020-06-09 ENCOUNTER — Other Ambulatory Visit: Payer: Self-pay

## 2020-06-09 ENCOUNTER — Telehealth: Payer: Self-pay

## 2020-06-09 ENCOUNTER — Encounter (HOSPITAL_COMMUNITY): Payer: Self-pay | Admitting: Obstetrics and Gynecology

## 2020-06-09 DIAGNOSIS — Z87440 Personal history of urinary (tract) infections: Secondary | ICD-10-CM | POA: Insufficient documentation

## 2020-06-09 DIAGNOSIS — Z7982 Long term (current) use of aspirin: Secondary | ICD-10-CM | POA: Diagnosis not present

## 2020-06-09 DIAGNOSIS — R748 Abnormal levels of other serum enzymes: Secondary | ICD-10-CM | POA: Diagnosis not present

## 2020-06-09 DIAGNOSIS — O26892 Other specified pregnancy related conditions, second trimester: Secondary | ICD-10-CM | POA: Diagnosis not present

## 2020-06-09 DIAGNOSIS — Z8759 Personal history of other complications of pregnancy, childbirth and the puerperium: Secondary | ICD-10-CM

## 2020-06-09 DIAGNOSIS — N189 Chronic kidney disease, unspecified: Secondary | ICD-10-CM | POA: Insufficient documentation

## 2020-06-09 DIAGNOSIS — R202 Paresthesia of skin: Secondary | ICD-10-CM

## 2020-06-09 DIAGNOSIS — R945 Abnormal results of liver function studies: Secondary | ICD-10-CM

## 2020-06-09 DIAGNOSIS — Z9049 Acquired absence of other specified parts of digestive tract: Secondary | ICD-10-CM | POA: Insufficient documentation

## 2020-06-09 DIAGNOSIS — R519 Headache, unspecified: Secondary | ICD-10-CM | POA: Diagnosis not present

## 2020-06-09 DIAGNOSIS — O10212 Pre-existing hypertensive chronic kidney disease complicating pregnancy, second trimester: Secondary | ICD-10-CM | POA: Diagnosis not present

## 2020-06-09 DIAGNOSIS — Z87442 Personal history of urinary calculi: Secondary | ICD-10-CM | POA: Insufficient documentation

## 2020-06-09 DIAGNOSIS — Z3689 Encounter for other specified antenatal screening: Secondary | ICD-10-CM

## 2020-06-09 DIAGNOSIS — Z3A26 26 weeks gestation of pregnancy: Secondary | ICD-10-CM | POA: Diagnosis not present

## 2020-06-09 DIAGNOSIS — Z880 Allergy status to penicillin: Secondary | ICD-10-CM | POA: Insufficient documentation

## 2020-06-09 DIAGNOSIS — O99891 Other specified diseases and conditions complicating pregnancy: Secondary | ICD-10-CM

## 2020-06-09 DIAGNOSIS — Z79899 Other long term (current) drug therapy: Secondary | ICD-10-CM | POA: Diagnosis not present

## 2020-06-09 DIAGNOSIS — R200 Anesthesia of skin: Secondary | ICD-10-CM

## 2020-06-09 HISTORY — DX: Unspecified ovarian cyst, unspecified side: N83.209

## 2020-06-09 HISTORY — DX: Unspecified infectious disease: B99.9

## 2020-06-09 HISTORY — DX: Headache, unspecified: R51.9

## 2020-06-09 LAB — CBC
HCT: 32.3 % — ABNORMAL LOW (ref 36.0–46.0)
Hemoglobin: 11 g/dL — ABNORMAL LOW (ref 12.0–15.0)
MCH: 33.1 pg (ref 26.0–34.0)
MCHC: 34.1 g/dL (ref 30.0–36.0)
MCV: 97.3 fL (ref 80.0–100.0)
Platelets: 204 10*3/uL (ref 150–400)
RBC: 3.32 MIL/uL — ABNORMAL LOW (ref 3.87–5.11)
RDW: 14.7 % (ref 11.5–15.5)
WBC: 7.7 10*3/uL (ref 4.0–10.5)
nRBC: 0 % (ref 0.0–0.2)

## 2020-06-09 LAB — URINALYSIS, ROUTINE W REFLEX MICROSCOPIC
Bilirubin Urine: NEGATIVE
Glucose, UA: NEGATIVE mg/dL
Hgb urine dipstick: NEGATIVE
Ketones, ur: 5 mg/dL — AB
Nitrite: NEGATIVE
Protein, ur: NEGATIVE mg/dL
Specific Gravity, Urine: 1.017 (ref 1.005–1.030)
pH: 8 (ref 5.0–8.0)

## 2020-06-09 LAB — COMPREHENSIVE METABOLIC PANEL
ALT: 56 U/L — ABNORMAL HIGH (ref 0–44)
AST: 43 U/L — ABNORMAL HIGH (ref 15–41)
Albumin: 2.8 g/dL — ABNORMAL LOW (ref 3.5–5.0)
Alkaline Phosphatase: 91 U/L (ref 38–126)
Anion gap: 8 (ref 5–15)
BUN: <5 mg/dL — ABNORMAL LOW (ref 6–20)
CO2: 25 mmol/L (ref 22–32)
Calcium: 8.8 mg/dL — ABNORMAL LOW (ref 8.9–10.3)
Chloride: 104 mmol/L (ref 98–111)
Creatinine, Ser: 0.54 mg/dL (ref 0.44–1.00)
GFR calc Af Amer: >60 mL/min (ref >60)
GFR calc non Af Amer: >60 mL/min (ref >60)
Glucose, Bld: 93 mg/dL (ref 70–99)
Potassium: 3.6 mmol/L (ref 3.5–5.1)
Sodium: 137 mmol/L (ref 135–145)
Total Bilirubin: 0.3 mg/dL (ref 0.3–1.2)
Total Protein: 5.8 g/dL — ABNORMAL LOW (ref 6.5–8.1)

## 2020-06-09 LAB — PROTEIN / CREATININE RATIO, URINE
Creatinine, Urine: 113.34 mg/dL
Protein Creatinine Ratio: 0.13 mg/mg{Cre} (ref 0.00–0.15)
Total Protein, Urine: 15 mg/dL

## 2020-06-09 MED ORDER — DEXAMETHASONE SODIUM PHOSPHATE 10 MG/ML IJ SOLN
10.0000 mg | Freq: Once | INTRAMUSCULAR | Status: AC
  Administered 2020-06-09: 10 mg via INTRAVENOUS
  Filled 2020-06-09: qty 1

## 2020-06-09 MED ORDER — DIPHENHYDRAMINE HCL 50 MG/ML IJ SOLN
12.5000 mg | Freq: Once | INTRAMUSCULAR | Status: AC
  Administered 2020-06-09: 12.5 mg via INTRAVENOUS
  Filled 2020-06-09: qty 1

## 2020-06-09 MED ORDER — METOCLOPRAMIDE HCL 5 MG/ML IJ SOLN
10.0000 mg | Freq: Once | INTRAMUSCULAR | Status: AC
  Administered 2020-06-09: 10 mg via INTRAVENOUS
  Filled 2020-06-09: qty 2

## 2020-06-09 MED ORDER — LACTATED RINGERS IV BOLUS
1000.0000 mL | Freq: Once | INTRAVENOUS | Status: AC
  Administered 2020-06-09: 1000 mL via INTRAVENOUS

## 2020-06-09 NOTE — MAU Note (Signed)
Took BP on pt's home BP monitor - was 135/93.

## 2020-06-09 NOTE — Discharge Instructions (Signed)
Hypertension During Pregnancy High blood pressure (hypertension) is when the force of blood pumping through the arteries is too strong. Arteries are blood vessels that carry blood from the heart throughout the body. Hypertension during pregnancy can be mild or severe. Severe hypertension during pregnancy (preeclampsia) is a medical emergency that requires prompt evaluation and treatment. Different types of hypertension can happen during pregnancy. These include:  Chronic hypertension. This happens when you had high blood pressure before you became pregnant, and it continues during the pregnancy. Hypertension that develops before you are [redacted] weeks pregnant and continues during the pregnancy is also called chronic hypertension. If you have chronic hypertension, it will not go away after you have your baby. You will need follow-up visits with your health care provider after you have your baby. Your doctor may want you to keep taking medicine for your blood pressure.  Gestational hypertension. This is hypertension that develops after the 20th week of pregnancy. Gestational hypertension usually goes away after you have your baby, but your health care provider will need to monitor your blood pressure to make sure that it is getting better.  Preeclampsia. This is severe hypertension during pregnancy. This can cause serious complications for you and your baby and can also cause complications for you after the delivery of your baby.  Postpartum preeclampsia. You may develop severe hypertension after giving birth. This usually occurs within 48 hours after childbirth but may occur up to 6 weeks after giving birth. This is rare. How does this affect me? Women who have hypertension during pregnancy have a greater chance of developing hypertension later in life or during future pregnancies. In some cases, hypertension during pregnancy can cause serious complications, such as:  Stroke.  Heart attack.  Injury to  other organs, such as kidneys, lungs, or liver.  Preeclampsia.  Convulsions or seizures.  Placental abruption. How does this affect my baby? Hypertension during pregnancy can affect your baby. Your baby may:  Be born early (prematurely).  Not weigh as much as he or she should at birth (low birth weight).  Not tolerate labor well, leading to an unplanned cesarean delivery. What are the risks? There are certain factors that make it more likely for you to develop hypertension during pregnancy. These include:  Having hypertension during a previous pregnancy.  Being overweight.  Being age 35 or older.  Being pregnant for the first time.  Being pregnant with more than one baby.  Becoming pregnant using fertilization methods, such as IVF (in vitro fertilization).  Having other medical problems, such as diabetes, kidney disease, or lupus.  Having a family history of hypertension. What can I do to lower my risk? The exact cause of hypertension during pregnancy is not known. You may be able to lower your risk by:  Maintaining a healthy weight.  Eating a healthy and balanced diet.  Following your health care provider's instructions about treating any long-term conditions that you had before becoming pregnant. It is very important to keep all of your prenatal care appointments. Your health care provider will check your blood pressure and make sure that your pregnancy is progressing as expected. If a problem is found, early treatment can prevent complications. How is this treated? Treatment for hypertension during pregnancy varies depending on the type of hypertension you have and how serious it is.  If you were taking medicine for high blood pressure before you became pregnant, talk with your health care provider. You may need to change medicine during pregnancy because   some medicines, like ACE inhibitors, may not be considered safe for your baby.  If you have gestational  hypertension, your health care provider may order medicine to treat this during pregnancy.  If you are at risk for preeclampsia, your health care provider may recommend that you take a low-dose aspirin during your pregnancy.  If you have severe hypertension, you may need to be hospitalized so you and your baby can be monitored closely. You may also need to be given medicine to lower your blood pressure. This medicine may be given by mouth or through an IV.  In some cases, if your condition gets worse, you may need to deliver your baby early. Follow these instructions at home: Eating and drinking   Drink enough fluid to keep your urine pale yellow.  Avoid caffeine. Lifestyle  Do not use any products that contain nicotine or tobacco, such as cigarettes, e-cigarettes, and chewing tobacco. If you need help quitting, ask your health care provider.  Do not use alcohol or drugs.  Avoid stress as much as possible.  Rest and get plenty of sleep.  Regular exercise can help to reduce your blood pressure. Ask your health care provider what kinds of exercise are best for you. General instructions  Take over-the-counter and prescription medicines only as told by your health care provider.  Keep all prenatal and follow-up visits as told by your health care provider. This is important. Contact a health care provider if:  You have symptoms that your health care provider told you may require more treatment or monitoring, such as: ? Headaches. ? Nausea or vomiting. ? Abdominal pain. ? Dizziness. ? Light-headedness. Get help right away if:  You have: ? Severe abdominal pain that does not get better with treatment. ? A severe headache that does not get better. ? Vomiting that does not get better. ? Sudden, rapid weight gain. ? Sudden swelling in your hands, ankles, or face. ? Vaginal bleeding. ? Blood in your urine. ? Blurred or double vision. ? Shortness of breath or chest  pain. ? Weakness on one side of your body. ? Difficulty speaking.  Your baby is not moving as much as usual. Summary  High blood pressure (hypertension) is when the force of blood pumping through the arteries is too strong.  Hypertension during pregnancy can cause problems for you and your baby.  Treatment for hypertension during pregnancy varies depending on the type of hypertension you have and how serious it is.  Keep all prenatal and follow-up visits as told by your health care provider. This is important. This information is not intended to replace advice given to you by your health care provider. Make sure you discuss any questions you have with your health care provider. Document Revised: 01/30/2019 Document Reviewed: 11/05/2018 Else[employer] Patient Education  2020 [employer]        Preeclampsia and Eclampsia Preeclampsia is a serious condition that may develop during pregnancy. This condition causes high blood pressure and increased protein in your urine along with other symptoms, such as headaches and vision changes. These symptoms may develop as the condition gets worse. Preeclampsia may occur at 20 weeks of pregnancy or later. Diagnosing and treating preeclampsia early is very important. If not treated early, it can cause serious problems for you and your baby. One problem it can lead to is eclampsia. Eclampsia is a condition that causes muscle jerking or shaking (convulsions or seizures) and other serious problems for the mother. During pregnancy, delivering your baby   may be the best treatment for preeclampsia or eclampsia. For most women, preeclampsia and eclampsia symptoms go away after giving birth. In rare cases, a woman may develop preeclampsia after giving birth (postpartum preeclampsia). This usually occurs within 48 hours after childbirth but may occur up to 6 weeks after giving birth. What are the causes? The cause of preeclampsia is not known. What increases the  risk? The following risk factors make you more likely to develop preeclampsia:  Being pregnant for the first time.  Having had preeclampsia during a past pregnancy.  Having a family history of preeclampsia.  Having high blood pressure.  Being pregnant with more than one baby.  Being 35 or older.  Being African-American.  Having kidney disease or diabetes.  Having medical conditions such as lupus or blood diseases.  Being very overweight (obese). What are the signs or symptoms? The most common symptoms are:  Severe headaches.  Vision problems, such as blurred or double vision.  Abdominal pain, especially upper abdominal pain. Other symptoms that may develop as the condition gets worse include:  Sudden weight gain.  Sudden swelling of the hands, face, legs, and feet.  Severe nausea and vomiting.  Numbness in the face, arms, legs, and feet.  Dizziness.  Urinating less than usual.  Slurred speech.  Convulsions or seizures. How is this diagnosed? There are no screening tests for preeclampsia. Your health care provider will ask you about symptoms and check for signs of preeclampsia during your prenatal visits. You may also have tests that include:  Checking your blood pressure.  Urine tests to check for protein. Your health care provider will check for this at every prenatal visit.  Blood tests.  Monitoring your baby's heart rate.  Ultrasound. How is this treated? You and your health care provider will determine the treatment approach that is best for you. Treatment may include:  Having more frequent prenatal exams to check for signs of preeclampsia, if you have an increased risk for preeclampsia.  Medicine to lower your blood pressure.  Staying in the hospital, if your condition is severe. There, treatment will focus on controlling your blood pressure and the amount of fluids in your body (fluid retention).  Taking medicine (magnesium sulfate) to prevent  seizures. This may be given as an injection or through an IV.  Taking a low-dose aspirin during your pregnancy.  Delivering your baby early. You may have your labor started with medicine (induced), or you may have a cesarean delivery. Follow these instructions at home: Eating and drinking   Drink enough fluid to keep your urine pale yellow.  Avoid caffeine. Lifestyle  Do not use any products that contain nicotine or tobacco, such as cigarettes and e-cigarettes. If you need help quitting, ask your health care provider.  Do not use alcohol or drugs.  Avoid stress as much as possible. Rest and get plenty of sleep. General instructions  Take over-the-counter and prescription medicines only as told by your health care provider.  When lying down, lie on your left side. This keeps pressure off your major blood vessels.  When sitting or lying down, raise (elevate) your feet. Try putting some pillows underneath your lower legs.  Exercise regularly. Ask your health care provider what kinds of exercise are best for you.  Keep all follow-up and prenatal visits as told by your health care provider. This is important. How is this prevented? There is no known way of preventing preeclampsia or eclampsia from developing. However, to lower your risk   of complications and detect problems early:  Get regular prenatal care. Your health care provider may be able to diagnose and treat the condition early.  Maintain a healthy weight. Ask your health care provider for help managing weight gain during pregnancy.  Work with your health care provider to manage any long-term (chronic) health conditions you have, such as diabetes or kidney problems.  You may have tests of your blood pressure and kidney function after giving birth.  Your health care provider may have you take low-dose aspirin during your next pregnancy. Contact a health care provider if:  You have symptoms that your health care provider  told you may require more treatment or monitoring, such as: ? Headaches. ? Nausea or vomiting. ? Abdominal pain. ? Dizziness. ? Light-headedness. Get help right away if:  You have severe: ? Abdominal pain. ? Headaches that do not get better. ? Dizziness. ? Vision problems. ? Confusion. ? Nausea or vomiting.  You have any of the following: ? A seizure. ? Sudden, rapid weight gain. ? Sudden swelling in your hands, ankles, or face. ? Trouble moving any part of your body. ? Numbness in any part of your body. ? Trouble speaking. ? Abnormal bleeding.  You faint. Summary  Preeclampsia is a serious condition that may develop during pregnancy.  This condition causes high blood pressure and increased protein in your urine along with other symptoms, such as headaches and vision changes.  Diagnosing and treating preeclampsia early is very important. If not treated early, it can cause serious problems for you and your baby.  Get help right away if you have symptoms that your health care provider told you to watch for. This information is not intended to replace advice given to you by your health care provider. Make sure you discuss any questions you have with your health care provider. Document Revised: 06/11/2018 Document Reviewed: 05/15/2016 Elsevier Patient Education  2020 [employer]        Preterm Labor and Birth Information  The normal length of a pregnancy is 39-41 weeks. Preterm labor is when labor starts before 37 completed weeks of pregnancy. What are the risk factors for preterm labor? Preterm labor is more likely to occur in women who:  Have certain infections during pregnancy such as a bladder infection, sexually transmitted infection, or infection inside the uterus (chorioamnionitis).  Have a shorter-than-normal cervix.  Have gone into preterm labor before.  Have had surgery on their cervix.  Are younger than age 17 or older than age 35.  Are African  American.  Are pregnant with twins or multiple babies (multiple gestation).  Take street drugs or smoke while pregnant.  Do not gain enough weight while pregnant.  Became pregnant shortly after having been pregnant. What are the symptoms of preterm labor? Symptoms of preterm labor include:  Cramps similar to those that can happen during a menstrual period. The cramps may happen with diarrhea.  Pain in the abdomen or lower back.  Regular uterine contractions that may feel like tightening of the abdomen.  A feeling of increased pressure in the pelvis.  Increased watery or bloody mucus discharge from the vagina.  Water breaking (ruptured amniotic sac). Why is it important to recognize signs of preterm labor? It is important to recognize signs of preterm labor because babies who are born prematurely may not be fully developed. This can put them at an increased risk for:  Long-term (chronic) heart and lung problems.  Difficulty immediately after birth with regulating   body systems, including blood sugar, body temperature, heart rate, and breathing rate.  Bleeding in the brain.  Cerebral palsy.  Learning difficulties.  Death. These risks are highest for babies who are born before 34 weeks of pregnancy. How is preterm labor treated? Treatment depends on the length of your pregnancy, your condition, and the health of your baby. It may involve:  Having a stitch (suture) placed in your cervix to prevent your cervix from opening too early (cerclage).  Taking or being given medicines, such as: ? Hormone medicines. These may be given early in pregnancy to help support the pregnancy. ? Medicine to stop contractions. ? Medicines to help mature the baby's lungs. These may be prescribed if the risk of delivery is high. ? Medicines to prevent your baby from developing cerebral palsy. If the labor happens before 34 weeks of pregnancy, you may need to stay in the hospital. What should I  do if I think I am in preterm labor? If you think that you are going into preterm labor, call your health care provider right away. How can I prevent preterm labor in future pregnancies? To increase your chance of having a full-term pregnancy:  Do not use any tobacco products, such as cigarettes, chewing tobacco, and e-cigarettes. If you need help quitting, ask your health care provider.  Do not use street drugs or medicines that have not been prescribed to you during your pregnancy.  Talk with your health care provider before taking any herbal supplements, even if you have been taking them regularly.  Make sure you gain a healthy amount of weight during your pregnancy.  Watch for infection. If you think that you might have an infection, get it checked right away.  Make sure to tell your health care provider if you have gone into preterm labor before. This information is not intended to replace advice given to you by your health care provider. Make sure you discuss any questions you have with your health care provider. Document Revised: 01/31/2019 Document Reviewed: 03/01/2016 Elsevier Patient Education  2020 [employer]  

## 2020-06-09 NOTE — MAU Note (Signed)
Has been having swelling in hands and feet. Has been having numbness in left arm from the elbow to the hand, occ on rt side - but not as bad or as often. Has been having daily migraines for about 2 wks,  Gets nausea with HA. been taking Tylenol, not helping none. Feeling dizzy. Decreased urination.  BP elevated when she checks it at home.

## 2020-06-09 NOTE — MAU Provider Note (Addendum)
History     CSN: [id number]  Arrival date and time: 06/09/20 1450   First Provider Initiated Contact with Patient 06/09/20 1702      Chief Complaint  Patient presents with   Headache   Hypertension   Foot Swelling   hands swollen   [name] is a 24 y.o. [phone] at 73w2dwho presents to MAU for migraine. Patient reports a dx history of migraines and is not doing anything to manage them this pregnancy except for sleeping in a cold/dark room. Patient reports she is not taking any migraine specific medications at this time, but reports she has taken "something with caffeine" in the past, but she cannot remember the exact name. Patient reports the migraine started two weeks ago and has only gotten progressively worse. Patient reports prior to this she would have migraines for a couple of days, but they would go away and then return. Patient reports she attempted to take 2 extra strength Tylenol this morning around 7AM, but this did not work for her.  Patient also reports swelling in her feet that worsens after she is sitting all day at work, without elevating her feet. Patient denies swelling at this time, but states it can "get really bad."  Patient reports that the entire right forearm from the elbow down is "numb and tingly." Patient reports this started one week ago and has gotten worse. Pt reports she has tried cold compresses and taking salt baths but this has not worked for her. Patient denies ever having this sensation in the past.  Pt denies VB, LOF, ctx, decreased FM, vaginal discharge/odor/itching. Pt denies N/V, abdominal pain, constipation, diarrhea, or urinary problems. Pt denies fever, chills, fatigue, sweating or changes in appetite. Pt denies SOB or chest pain. Pt denies dizziness, light-headedness, weakness.  Problems this pregnancy include: gHTN. Allergies? PCN Current medications/supplements? PNVs, Flexeril PRN, bASA Prenatal care provider? [facility],  next appt 06/15/2020   OB History    Gravida  8   Para  4   Term  1   Preterm  3   AB  3   Living  4     SAB  3   TAB  0   Ectopic  0   Multiple  0   Live Births  4           Past Medical History:  Diagnosis Date   Anxiety    Chlamydia 2018, 2021   Chronic kidney disease    kidney stones and kidney infection with last pregnancy   Depression    "gets in her moods, currently ok'   Gonorrhea 2018   Headache    Incisional hernia 04/2013   Infection    UTI   LGSIL on Pap smear of cervix 2019   Ovarian cyst    Urethral diverticulum     Past Surgical History:  Procedure Laterality Date   CHOLECYSTECTOMY  04/03/2012   Procedure: LAPAROSCOPIC CHOLECYSTECTOMY WITH INTRAOPERATIVE CHOLANGIOGRAM;  Surgeon: TEarnstine Regal MD;  Location: [facility];  Service: General;  Laterality: N/A;   ERCP  03/12/2012   Procedure: ENDOSCOPIC RETROGRADE CHOLANGIOPANCREATOGRAPHY (ERCP);  Surgeon: MJeryl Columbia MD;  [place] [facility]  Service: Endoscopy;  Laterality: N/A;   INGUINAL HERNIA REPAIR N/A 05/15/2013   Procedure: HERNIA REPAIR INCISIONAL;  Surgeon: TEarnstine Regal MD;  [facility]  Service: General;  Laterality: N/A;   INSERTION OF MESH N/A 05/15/2013   Procedure: INSERTION OF MESH;  Surgeon:  Earnstine Regal, MD;  Location: [place];  Service: General;  Laterality: N/A;   URETERAL STENT PLACEMENT     WISDOM TOOTH EXTRACTION      Family History  Problem Relation Age of Onset   Hypertension Mother    Hypertension Father    Diabetes Paternal Grandmother    Cancer Paternal Grandfather    Seizures Paternal Grandfather     Social History   Tobacco Use   Smoking status: Never Smoker   Smokeless tobacco: Never Used  Vaping Use   Vaping Use: Never used  Substance Use Topics   Alcohol use: Not Currently    Comment: last drink 2 yrs ago   Drug use: No    Allergies:  Allergies  Allergen Reactions    Penicillins Anaphylaxis, Itching, Swelling, Rash and Other (See Comments)    Has patient had a PCN reaction causing immediate rash, facial/tongue/throat swelling, SOB or lightheadedness with hypotension: Yes Has patient had a PCN reaction causing severe rash involving mucus membranes or skin necrosis: No Has patient had a PCN reaction that required hospitalization: No Has patient had a PCN reaction occurring within the last 10 years: No If all of the above answers are "NO", then may proceed with Cephalosporin use.  And swelling    Medications Prior to Admission  Medication Sig Dispense Refill Last Dose   Acetaminophen (TYLENOL) 325 MG CAPS Take 1,000 mg by mouth.   06/09/2020 at Unknown time   aspirin EC 81 MG tablet Take 1 tablet (81 mg total) by mouth daily. Swallow whole. 30 tablet 11 06/09/2020 at Unknown time   cyclobenzaprine (FLEXERIL) 10 MG tablet Take 1 tablet (10 mg total) by mouth every 8 (eight) hours as needed (back pain). 30 tablet 0 Past Week at Unknown time   Multiple Vitamin (MULTIVITAMIN WITH MINERALS) TABS tablet Take 1 tablet by mouth daily.   06/09/2020 at Unknown time   Blood Pressure Monitor KIT 1 Device by Does not apply route once a week. To be monitored Regularly at home. 1 kit 0    Elastic Bandages & Supports (COMFORT FIT MATERNITY SUPP LG) MISC 1 application by Does not apply route daily. 1 each 0     Review of Systems  Constitutional: Negative for chills, diaphoresis, fatigue and fever.  Eyes: Negative for visual disturbance.  Respiratory: Negative for shortness of breath.   Cardiovascular: Negative for chest pain.  Gastrointestinal: Negative for abdominal pain, constipation, diarrhea, nausea and vomiting.  Genitourinary: Negative for dysuria, flank pain, frequency, pelvic pain, urgency, vaginal bleeding and vaginal discharge.  Musculoskeletal:       Bilateral foot swelling, not present at this time  Neurological: Positive for numbness (left forearm from  elbow to fingers) and headaches. Negative for dizziness, weakness and light-headedness.   Physical Exam   Blood pressure (!) 108/51, pulse 82, temperature 98.4 F (36.9 C), temperature source Oral, resp. rate 18, height 5' 5"  (1.651 m), weight 102.2 kg, last menstrual period 06/29/2019, SpO2 100 %, unknown if currently breastfeeding.  Patient Vitals for the past 24 hrs:  BP Temp Temp src Pulse Resp SpO2 Height Weight  06/09/20 1810 -- -- -- -- -- 100 % -- --  06/09/20 1805 -- -- -- -- -- 99 % -- --  06/09/20 1800 (!) 108/51 -- -- 82 -- 99 % -- --  06/09/20 1755 -- -- -- -- -- 99 % -- --  06/09/20 1750 -- -- -- -- -- 99 % -- --  06/09/20  1745 123/63 -- -- 86 -- 98 % -- --  06/09/20 1735 -- -- -- -- -- 98 % -- --  06/09/20 1730 (!) 118/51 -- -- 86 -- 100 % -- --  06/09/20 1725 -- -- -- -- -- 99 % -- --  06/09/20 1720 -- -- -- -- -- 99 % -- --  06/09/20 1715 -- -- -- -- -- 100 % -- --  06/09/20 1705 -- -- -- -- -- 99 % -- --  06/09/20 1700 (!) 116/53 -- -- 87 -- 98 % -- --  06/09/20 1655 -- -- -- -- -- 98 % -- --  06/09/20 1649 -- -- -- -- -- 98 % -- --  06/09/20 1645 (!) 118/53 -- -- 88 -- 98 % -- --  06/09/20 1640 -- -- -- -- -- 98 % -- --  06/09/20 1635 -- -- -- -- -- 98 % -- --  06/09/20 1630 (!) 116/59 -- -- 91 -- 98 % -- --  06/09/20 1625 -- -- -- -- -- 98 % -- --  06/09/20 1620 -- -- -- -- -- 98 % -- --  06/09/20 1615 (!) 114/57 -- -- 85 -- 98 % -- --  06/09/20 1610 -- -- -- -- -- 98 % -- --  06/09/20 1600 121/63 -- -- (!) 102 -- 98 % -- --  06/09/20 1558 121/68 -- -- (!) 101 -- -- -- --  06/09/20 1536 120/68 98.4 F (36.9 C) Oral (!) 107 18 100 % 5' 5"  (1.651 m) 102.2 kg   Physical Exam Vitals and nursing note reviewed.  Constitutional:      General: She is not in acute distress.    Appearance: She is well-developed. She is not ill-appearing, toxic-appearing or diaphoretic.  HENT:     Head: Normocephalic and atraumatic.  Pulmonary:     Effort: Pulmonary effort is  normal.  Skin:    General: Skin is warm and dry.  Neurological:     Mental Status: She is alert and oriented to person, place, and time.     Sensory: Sensory deficit present.     Comments: Sharp and dull test performed on bilateral forearms/hands/fingers. Patient able to differentiate sharp and dull on right forearms/hands/fingers, but reports sensations are not the same on the left forearm/hand/fingers and is unable to differentiate sharp from dull on this side. Equal grip strength bilaterally.  Psychiatric:        Mood and Affect: Mood normal.        Behavior: Behavior normal.        Thought Content: Thought content normal.        Judgment: Judgment normal.    Results for orders placed or performed during the hospital encounter of 06/09/20 (from the past 24 hour(s))  Urinalysis, Routine w reflex microscopic Urine, Clean Catch     Status: Abnormal   Collection Time: 06/09/20  4:55 PM  Result Value Ref Range   Color, Urine AMBER (A) YELLOW   APPearance CLOUDY (A) CLEAR   Specific Gravity, Urine 1.017 1.005 - 1.030   pH 8.0 5.0 - 8.0   Glucose, UA NEGATIVE NEGATIVE mg/dL   Hgb urine dipstick NEGATIVE NEGATIVE   Bilirubin Urine NEGATIVE NEGATIVE   Ketones, ur 5 (A) NEGATIVE mg/dL   Protein, ur NEGATIVE NEGATIVE mg/dL   Nitrite NEGATIVE NEGATIVE   Leukocytes,Ua TRACE (A) NEGATIVE   RBC / HPF 0-5 0 - 5 RBC/hpf   WBC, UA 0-5 0 - 5 WBC/hpf   Bacteria,  UA RARE (A) NONE SEEN   Squamous Epithelial / LPF 6-10 0 - 5   Mucus PRESENT    Amorphous Crystal PRESENT   Protein / creatinine ratio, urine     Status: None   Collection Time: 06/09/20  5:50 PM  Result Value Ref Range   Creatinine, Urine 113.34 mg/dL   Total Protein, Urine 15 mg/dL   Protein Creatinine Ratio 0.13 0.00 - 0.15 mg/mg[Cre]  CBC     Status: Abnormal   Collection Time: 06/09/20  5:56 PM  Result Value Ref Range   WBC 7.7 4.0 - 10.5 K/uL   RBC 3.32 (L) 3.87 - 5.11 MIL/uL   Hemoglobin 11.0 (L) 12.0 - 15.0 g/dL   HCT  32.3 (L) 36 - 46 %   MCV 97.3 80.0 - 100.0 fL   MCH 33.1 26.0 - 34.0 pg   MCHC 34.1 30.0 - 36.0 g/dL   RDW 14.7 11.5 - 15.5 %   Platelets 204 150 - 400 K/uL   nRBC 0.0 0.0 - 0.2 %  Comprehensive metabolic panel     Status: Abnormal   Collection Time: 06/09/20  5:56 PM  Result Value Ref Range   Sodium 137 135 - 145 mmol/L   Potassium 3.6 3.5 - 5.1 mmol/L   Chloride 104 98 - 111 mmol/L   CO2 25 22 - 32 mmol/L   Glucose, Bld 93 70 - 99 mg/dL   BUN <5 (L) 6 - 20 mg/dL   Creatinine, Ser 0.54 0.44 - 1.00 mg/dL   Calcium 8.8 (L) 8.9 - 10.3 mg/dL   Total Protein 5.8 (L) 6.5 - 8.1 g/dL   Albumin 2.8 (L) 3.5 - 5.0 g/dL   AST 43 (H) 15 - 41 U/L   ALT 56 (H) 0 - 44 U/L   Alkaline Phosphatase 91 38 - 126 U/L   Total Bilirubin 0.3 0.3 - 1.2 mg/dL   GFR calc non Af Amer >60 >60 mL/min   GFR calc Af Amer >60 >60 mL/min   Anion gap 8 5 - 15   No results found.  MAU Course  Procedures  MDM -6/10 HA in setting of known migraine history -hx gHTN, normal BPs in MAU -HA cocktail given, after administration HA now 2/10 -UA: amber/cloudy/5 ketones/trace leuks/rare bacteria, sending urine for culture -CBC: WNL for pregnancy -CMP: AST/ALT 43/56 -PCr: 0.13 -EFM: reactive       -baseline: 145       -variability: moderate       -accels: present, 15x15       -decels: few variables       -TOCO: irritability -consulted with Dr. Elly Modena re: numbness and neurologic physical exam results, per Dr. Elly Modena, can refer to neurology outpatient and does not need to be transferred to ED for evaluation. Pt also OK to be discharged home with slightly elevated LFTs, can repeat at next prenatal visit. -pt discharged to home in stable condition  Orders Placed This Encounter  Procedures   Culture, OB Urine    Standing Status:   Standing    Number of Occurrences:   1   Urinalysis, Routine w reflex microscopic Urine, Clean Catch    Standing Status:   Standing    Number of Occurrences:   1   CBC     Standing Status:   Standing    Number of Occurrences:   1   Comprehensive metabolic panel    Standing Status:   Standing    Number of Occurrences:  1   Protein / creatinine ratio, urine    Standing Status:   Standing    Number of Occurrences:   1   AMB referral to headache clinic    Referral Priority:   Urgent    Referral Type:   Consultation    Referred to Provider:   Jaclyn Prime, Collene Leyden, PA-C    Number of Visits Requested:   1   Ambulatory referral to Neurology    Referral Priority:   Urgent    Referral Type:   Consultation    Referral Reason:   Specialty Services Required    Requested Specialty:   Neurology    Number of Visits Requested:   1   Insert peripheral IV    Standing Status:   Standing    Number of Occurrences:   1   Discharge patient    Order Specific Question:   Discharge disposition    Answer:   01-Home or Self Care [1]    Order Specific Question:   Discharge patient date    Answer:   06/09/2020   Meds ordered this encounter  Medications   lactated ringers bolus 1,000 mL   dexamethasone (DECADRON) injection 10 mg   diphenhydrAMINE (BENADRYL) injection 12.5 mg   metoCLOPramide (REGLAN) injection 10 mg    Assessment and Plan   1. Pregnancy headache in second trimester   2. Elevated liver enzymes   3. History of gestational hypertension   4. [redacted] weeks gestation of pregnancy   5. NST (non-stress test) reactive   6. Arm numbness left     Allergies as of 06/09/2020      Reactions   Penicillins Anaphylaxis, Itching, Swelling, Rash, Other (See Comments)   Has patient had a PCN reaction causing immediate rash, facial/tongue/throat swelling, SOB or lightheadedness with hypotension: Yes Has patient had a PCN reaction causing severe rash involving mucus membranes or skin necrosis: No Has patient had a PCN reaction that required hospitalization: No Has patient had a PCN reaction occurring within the last 10 years: No If all of the above answers are  "NO", then may proceed with Cephalosporin use.  And swelling      Medication List    TAKE these medications   aspirin EC 81 MG tablet Take 1 tablet (81 mg total) by mouth daily. Swallow whole.   Blood Pressure Monitor Kit 1 Device by Does not apply route once a week. To be monitored Regularly at home.   Comfort Fit Maternity Supp Lg Misc 1 application by Does not apply route daily.   cyclobenzaprine 10 MG tablet Commonly known as: FLEXERIL Take 1 tablet (10 mg total) by mouth every 8 (eight) hours as needed (back pain).   multivitamin with minerals Tabs tablet Take 1 tablet by mouth daily.   Tylenol 325 MG Caps Generic drug: Acetaminophen Take 1,000 mg by mouth.      -will call with culture results, if positive -referral to HA clinic -pt advised to use combo of Flexeril and Tylenol at initial onset of HA while waiting for HA referral appointment, pt reports she has Flexeril available to her at home -discussed use of compression stockings/socks, increased hydration, elevation of feet, dietary salt reduction, and intermittent movement of legs/ankles/walking throughout the day to avoid stasis in lower extremities -referral to neurology -discussed when to present to ED for worsening s/sx of neurological impairment -return MAU precautions given -pt discharged to home in stable condition  Elmyra Ricks E Jaena Brocato 06/09/2020, 7:59 PM

## 2020-06-09 NOTE — Telephone Encounter (Signed)
Patient called in and stated that she has been having headaches, and she is dizzy, feels like she is going to faint, has swelling in her hands, feet, and eyes. Pt also reports dark urine, advised pt to be evaluated at the hospital.

## 2020-06-10 ENCOUNTER — Ambulatory Visit: Payer: Medicaid Other | Admitting: Physical Therapy

## 2020-06-10 LAB — CULTURE, OB URINE: Culture: 10000 — AB

## 2020-06-14 ENCOUNTER — Telehealth: Payer: Self-pay

## 2020-06-14 NOTE — Telephone Encounter (Signed)
Pt noticed recent abnormal lab values on MyChart   She is concerned she may have preeclampsia d/t HA's & numbness in L arm.  After reviewing the provider's notes, I reassured pt her BP was WNL and lab values did not indicate preeclampsia at this time.  Referrals were made for HA clinic and Neurologist  Pt to keep u/s and ROB visit tomorrow.

## 2020-06-15 ENCOUNTER — Other Ambulatory Visit: Payer: Self-pay

## 2020-06-15 ENCOUNTER — Ambulatory Visit: Payer: Medicaid Other | Attending: Obstetrics and Gynecology

## 2020-06-15 ENCOUNTER — Encounter: Payer: Self-pay | Admitting: Obstetrics and Gynecology

## 2020-06-15 ENCOUNTER — Ambulatory Visit (INDEPENDENT_AMBULATORY_CARE_PROVIDER_SITE_OTHER): Payer: Medicaid Other | Admitting: Obstetrics and Gynecology

## 2020-06-15 ENCOUNTER — Ambulatory Visit: Payer: Medicaid Other | Admitting: *Deleted

## 2020-06-15 ENCOUNTER — Encounter: Payer: Self-pay | Admitting: Obstetrics

## 2020-06-15 ENCOUNTER — Encounter: Payer: Self-pay | Admitting: *Deleted

## 2020-06-15 VITALS — BP 101/67 | HR 102 | Wt 224.9 lb

## 2020-06-15 DIAGNOSIS — O99210 Obesity complicating pregnancy, unspecified trimester: Secondary | ICD-10-CM

## 2020-06-15 DIAGNOSIS — O09899 Supervision of other high risk pregnancies, unspecified trimester: Secondary | ICD-10-CM

## 2020-06-15 DIAGNOSIS — O99212 Obesity complicating pregnancy, second trimester: Secondary | ICD-10-CM | POA: Diagnosis present

## 2020-06-15 DIAGNOSIS — O98819 Other maternal infectious and parasitic diseases complicating pregnancy, unspecified trimester: Secondary | ICD-10-CM | POA: Insufficient documentation

## 2020-06-15 DIAGNOSIS — O09292 Supervision of pregnancy with other poor reproductive or obstetric history, second trimester: Secondary | ICD-10-CM

## 2020-06-15 DIAGNOSIS — A749 Chlamydial infection, unspecified: Secondary | ICD-10-CM

## 2020-06-15 DIAGNOSIS — Z3A27 27 weeks gestation of pregnancy: Secondary | ICD-10-CM

## 2020-06-15 DIAGNOSIS — Z8759 Personal history of other complications of pregnancy, childbirth and the puerperium: Secondary | ICD-10-CM | POA: Diagnosis present

## 2020-06-15 DIAGNOSIS — Z362 Encounter for other antenatal screening follow-up: Secondary | ICD-10-CM

## 2020-06-15 DIAGNOSIS — R748 Abnormal levels of other serum enzymes: Secondary | ICD-10-CM

## 2020-06-15 DIAGNOSIS — O322XX0 Maternal care for transverse and oblique lie, not applicable or unspecified: Secondary | ICD-10-CM

## 2020-06-15 DIAGNOSIS — Z8751 Personal history of pre-term labor: Secondary | ICD-10-CM

## 2020-06-15 DIAGNOSIS — O09212 Supervision of pregnancy with history of pre-term labor, second trimester: Secondary | ICD-10-CM

## 2020-06-15 DIAGNOSIS — M549 Dorsalgia, unspecified: Secondary | ICD-10-CM

## 2020-06-15 DIAGNOSIS — O99891 Other specified diseases and conditions complicating pregnancy: Secondary | ICD-10-CM

## 2020-06-15 DIAGNOSIS — E669 Obesity, unspecified: Secondary | ICD-10-CM

## 2020-06-15 NOTE — Progress Notes (Signed)
Patient reports fetal movement, complains of occasional back pain. Pt states she forgot to bring home BP cuff today.

## 2020-06-15 NOTE — Progress Notes (Signed)
   PRENATAL VISIT NOTE  Subjective:  [name] is a 24 y.o. [phone] at [redacted]w[redacted]d being seen today for ongoing prenatal care.  She is currently monitored for the following issues for this high-risk pregnancy and has Incisional hernia; Supervision of other high risk pregnancy, antepartum; Grand multipara; History of preterm delivery; History of gestational hypertension; Chlamydia infection during pregnancy; Obesity in pregnancy; BMI 37.0-37.9, adult; Back pain affecting pregnancy in second trimester; and Elevated liver enzymes on their problem list.  Patient reports backache.  Contractions: Irritability. Vag. Bleeding: None.  Movement: Present. Denies leaking of fluid.   The following portions of the patient's history were reviewed and updated as appropriate: allergies, current medications, past family history, past medical history, past social history, past surgical history and problem list.   Objective:   Vitals:   06/15/20 1357  BP: 101/67  Pulse: (!) 102  Weight: 224 lb 14.4 oz (102 kg)    Fetal Status: Fetal Heart Rate (bpm): 150 Fundal Height: 28 cm Movement: Present     General:  Alert, oriented and cooperative. Patient is in no acute distress.  Skin: Skin is warm and dry. No rash noted.   Cardiovascular: Normal heart rate noted  Respiratory: Normal respiratory effort, no problems with respiration noted  Abdomen: Soft, gravid, appropriate for gestational age.  Pain/Pressure: Present     Pelvic: Cervical exam deferred        Extremities: Normal range of motion.  Edema: Trace  Mental Status: Normal mood and affect. Normal behavior. Normal judgment and thought content.   Assessment and Plan:  Pregnancy: [id number] at [redacted]w[redacted]d 1. Supervision of other high risk pregnancy, antepartum - Patient for 2hr GTT next week.  2. Elevated liver enzymes - Patient to stop taking tylenol for now. - Hepatitis, Acute  3. History of preterm delivery - Continue 17-P - Preterm labor  precautions discussed.  4. Back pain affecting pregnancy in second trimester - Stretching exercises and warm baths recommended. - Will hold tylenol use for now. - Ambulatory referral to Physical Therapy  5. History of gestational hypertension - PreEclampsia precautions reviewed.    Preterm labor symptoms and general obstetric precautions including but not limited to vaginal bleeding, contractions, leaking of fluid and fetal movement were reviewed in detail with the patient. Please refer to After Visit Summary for other counseling recommendations.   Return in about 1 week (around 06/22/2020) for For 2hr GTT only.  Regular ROB appointment in 2 weeks.  .  Future Appointments  Date Time Provider Department Center  06/23/2020  9:30 AM Suanne Marker, MD [facility] None  07/13/2020  8:45 AM [facility]    Johnny Bridge, MD

## 2020-06-15 NOTE — Patient Instructions (Signed)

## 2020-06-16 ENCOUNTER — Ambulatory Visit: Payer: Medicaid Other | Admitting: Physical Therapy

## 2020-06-16 LAB — HEPATITIS PANEL, ACUTE
Hep A IgM: NEGATIVE
Hep B C IgM: NEGATIVE
Hep C Virus Ab: <0.1 s/co ratio (ref 0.0–0.9)
Hepatitis B Surface Ag: NEGATIVE

## 2020-06-17 ENCOUNTER — Other Ambulatory Visit: Payer: Self-pay | Admitting: *Deleted

## 2020-06-17 DIAGNOSIS — Z6841 Body Mass Index (BMI) 40.0 and over, adult: Secondary | ICD-10-CM

## 2020-06-23 ENCOUNTER — Telehealth: Payer: Self-pay | Admitting: *Deleted

## 2020-06-23 ENCOUNTER — Encounter: Payer: Self-pay | Admitting: Diagnostic Neuroimaging

## 2020-06-23 ENCOUNTER — Encounter: Payer: Medicaid Other | Admitting: Physical Therapy

## 2020-06-23 ENCOUNTER — Ambulatory Visit: Payer: Self-pay | Admitting: Diagnostic Neuroimaging

## 2020-06-23 NOTE — Telephone Encounter (Signed)
Patient was no show for new patient appointment today. 

## 2020-06-24 ENCOUNTER — Other Ambulatory Visit: Payer: Self-pay

## 2020-06-24 ENCOUNTER — Other Ambulatory Visit: Payer: Medicaid Other

## 2020-06-24 DIAGNOSIS — O09899 Supervision of other high risk pregnancies, unspecified trimester: Secondary | ICD-10-CM

## 2020-06-25 LAB — RPR: RPR Ser Ql: NONREACTIVE

## 2020-06-25 LAB — CBC
Hematocrit: 33.7 % — ABNORMAL LOW (ref 34.0–46.6)
Hemoglobin: 11.3 g/dL (ref 11.1–15.9)
MCH: 33 pg (ref 26.6–33.0)
MCHC: 33.5 g/dL (ref 31.5–35.7)
MCV: 99 fL — ABNORMAL HIGH (ref 79–97)
Platelets: 209 10*3/uL (ref 150–450)
RBC: 3.42 x10E6/uL — ABNORMAL LOW (ref 3.77–5.28)
RDW: 13.5 % (ref 11.7–15.4)
WBC: 8.4 10*3/uL (ref 3.4–10.8)

## 2020-06-25 LAB — GLUCOSE TOLERANCE, 2 HOURS W/ 1HR
Glucose, 1 hour: 127 mg/dL (ref 65–179)
Glucose, 2 hour: 101 mg/dL (ref 65–152)
Glucose, Fasting: 76 mg/dL (ref 65–91)

## 2020-06-25 LAB — HIV ANTIBODY (ROUTINE TESTING W REFLEX): HIV Screen 4th Generation wRfx: NONREACTIVE

## 2020-06-30 ENCOUNTER — Encounter: Payer: Self-pay | Admitting: Obstetrics and Gynecology

## 2020-06-30 ENCOUNTER — Other Ambulatory Visit: Payer: Self-pay

## 2020-06-30 ENCOUNTER — Ambulatory Visit (INDEPENDENT_AMBULATORY_CARE_PROVIDER_SITE_OTHER): Payer: Medicaid Other | Admitting: Obstetrics and Gynecology

## 2020-06-30 VITALS — BP 107/72 | HR 100 | Wt 223.9 lb

## 2020-06-30 DIAGNOSIS — Z8751 Personal history of pre-term labor: Secondary | ICD-10-CM

## 2020-06-30 DIAGNOSIS — O99210 Obesity complicating pregnancy, unspecified trimester: Secondary | ICD-10-CM

## 2020-06-30 DIAGNOSIS — Z8759 Personal history of other complications of pregnancy, childbirth and the puerperium: Secondary | ICD-10-CM

## 2020-06-30 DIAGNOSIS — O09899 Supervision of other high risk pregnancies, unspecified trimester: Secondary | ICD-10-CM

## 2020-06-30 NOTE — Progress Notes (Signed)
   PRENATAL VISIT NOTE  Subjective:  [name] is a 24 y.o. [phone] at [redacted]w[redacted]d being seen today for ongoing prenatal care.  She is currently monitored for the following issues for this high-risk pregnancy and has Incisional hernia; Supervision of other high risk pregnancy, antepartum; Grand multipara; History of preterm delivery; History of gestational hypertension; Chlamydia infection during pregnancy; Obesity in pregnancy; BMI 37.0-37.9, adult; Back pain affecting pregnancy in second trimester; and Elevated liver enzymes on their problem list.  Patient reports persistent pelvic pain.  Contractions: Irritability. Vag. Bleeding: None.  Movement: Present. Denies leaking of fluid.   The following portions of the patient's history were reviewed and updated as appropriate: allergies, current medications, past family history, past medical history, past social history, past surgical history and problem list.   Objective:   Vitals:   06/30/20 0804  BP: 107/72  Pulse: 100  Weight: 101.6 kg    Fetal Status: Fetal Heart Rate (bpm): 160 Fundal Height: 30 cm Movement: Present     General:  Alert, oriented and cooperative. Patient is in no acute distress.  Skin: Skin is warm and dry. No rash noted.   Cardiovascular: Normal heart rate noted  Respiratory: Normal respiratory effort, no problems with respiration noted  Abdomen: Soft, gravid, appropriate for gestational age.  Pain/Pressure: Present     Pelvic: Cervical exam deferred        Extremities: Normal range of motion.  Edema: Trace  Mental Status: Normal mood and affect. Normal behavior. Normal judgment and thought content.   Assessment and Plan:  Pregnancy: [id number] at [redacted]w[redacted]d 1. Supervision of other high risk pregnancy, antepartum Patient is doing well without complaints Normal glucola  2. Obesity in pregnancy Continue ASA Follow up growth 9/21  3. History of gestational hypertension Normotensive  4. History of preterm  delivery Patient discontinue 17-P Precautions reviewed  Preterm labor symptoms and general obstetric precautions including but not limited to vaginal bleeding, contractions, leaking of fluid and fetal movement were reviewed in detail with the patient. Please refer to After Visit Summary for other counseling recommendations.   Return in about 2 weeks (around 07/14/2020) for Virtual, ROB, High risk.  Future Appointments  Date Time Provider Department Center  07/06/2020 10:30 AM Zaunegger, Reola Mosher, PT [facility] None  07/13/2020  8:30 AM [facility] NURSE [facility]  07/13/2020  8:45 AM [facility] US[facility]  07/30/2020  8:00 AM Teague Edwena Blow, PA-C [facility]    Catalina Antigua, MD

## 2020-06-30 NOTE — Progress Notes (Signed)
Pt is here for ROB, [redacted]w[redacted]d. Pt reports that she stopped her Makena injections 2 weeks ago for unwanted side effects, she reports it was making her feet swell and making her feel "sick".

## 2020-07-06 ENCOUNTER — Ambulatory Visit (HOSPITAL_COMMUNITY): Payer: Medicaid Other | Admitting: Physical Therapy

## 2020-07-13 ENCOUNTER — Ambulatory Visit: Payer: Medicaid Other | Admitting: *Deleted

## 2020-07-13 ENCOUNTER — Other Ambulatory Visit: Payer: Self-pay

## 2020-07-13 ENCOUNTER — Other Ambulatory Visit: Payer: Self-pay | Admitting: *Deleted

## 2020-07-13 ENCOUNTER — Ambulatory Visit: Payer: Medicaid Other | Attending: Obstetrics and Gynecology

## 2020-07-13 ENCOUNTER — Encounter: Payer: Self-pay | Admitting: *Deleted

## 2020-07-13 DIAGNOSIS — O99212 Obesity complicating pregnancy, second trimester: Secondary | ICD-10-CM

## 2020-07-13 DIAGNOSIS — E669 Obesity, unspecified: Secondary | ICD-10-CM

## 2020-07-13 DIAGNOSIS — O09899 Supervision of other high risk pregnancies, unspecified trimester: Secondary | ICD-10-CM

## 2020-07-13 DIAGNOSIS — Z362 Encounter for other antenatal screening follow-up: Secondary | ICD-10-CM

## 2020-07-13 DIAGNOSIS — Z8759 Personal history of other complications of pregnancy, childbirth and the puerperium: Secondary | ICD-10-CM | POA: Insufficient documentation

## 2020-07-13 DIAGNOSIS — Z3A31 31 weeks gestation of pregnancy: Secondary | ICD-10-CM | POA: Insufficient documentation

## 2020-07-13 DIAGNOSIS — O99213 Obesity complicating pregnancy, third trimester: Secondary | ICD-10-CM | POA: Insufficient documentation

## 2020-07-13 DIAGNOSIS — O09213 Supervision of pregnancy with history of pre-term labor, third trimester: Secondary | ICD-10-CM

## 2020-07-13 DIAGNOSIS — R748 Abnormal levels of other serum enzymes: Secondary | ICD-10-CM

## 2020-07-13 DIAGNOSIS — O99210 Obesity complicating pregnancy, unspecified trimester: Secondary | ICD-10-CM

## 2020-07-13 DIAGNOSIS — Z6841 Body Mass Index (BMI) 40.0 and over, adult: Secondary | ICD-10-CM

## 2020-07-13 DIAGNOSIS — O09293 Supervision of pregnancy with other poor reproductive or obstetric history, third trimester: Secondary | ICD-10-CM

## 2020-07-13 DIAGNOSIS — O98819 Other maternal infectious and parasitic diseases complicating pregnancy, unspecified trimester: Secondary | ICD-10-CM

## 2020-07-14 ENCOUNTER — Telehealth (INDEPENDENT_AMBULATORY_CARE_PROVIDER_SITE_OTHER): Payer: Medicaid Other | Admitting: Obstetrics and Gynecology

## 2020-07-14 ENCOUNTER — Encounter: Payer: Self-pay | Admitting: Obstetrics and Gynecology

## 2020-07-14 VITALS — BP 134/75 | HR 78

## 2020-07-14 DIAGNOSIS — Z3A31 31 weeks gestation of pregnancy: Secondary | ICD-10-CM

## 2020-07-14 DIAGNOSIS — O99213 Obesity complicating pregnancy, third trimester: Secondary | ICD-10-CM

## 2020-07-14 DIAGNOSIS — O99210 Obesity complicating pregnancy, unspecified trimester: Secondary | ICD-10-CM

## 2020-07-14 DIAGNOSIS — Z8759 Personal history of other complications of pregnancy, childbirth and the puerperium: Secondary | ICD-10-CM

## 2020-07-14 DIAGNOSIS — E669 Obesity, unspecified: Secondary | ICD-10-CM

## 2020-07-14 DIAGNOSIS — O09213 Supervision of pregnancy with history of pre-term labor, third trimester: Secondary | ICD-10-CM

## 2020-07-14 DIAGNOSIS — O09899 Supervision of other high risk pregnancies, unspecified trimester: Secondary | ICD-10-CM

## 2020-07-14 DIAGNOSIS — Z8751 Personal history of pre-term labor: Secondary | ICD-10-CM

## 2020-07-14 DIAGNOSIS — O09293 Supervision of pregnancy with other poor reproductive or obstetric history, third trimester: Secondary | ICD-10-CM

## 2020-07-14 MED ORDER — CITALOPRAM HYDROBROMIDE 20 MG PO TABS: ORAL_TABLET | ORAL | 1 refills | Status: DC

## 2020-07-14 NOTE — Progress Notes (Signed)
I connected with [name] 07/14/20 at  8:30 AM EDT by: MyChart video and verified that I am speaking with the correct person using two identifiers.  Patient is located at home and provider is located at [place].     The purpose of this virtual visit is to provide medical care while limiting exposure to the novel coronavirus. I discussed the limitations, risks, security and privacy concerns of performing an evaluation and management service by MyChart video and the availability of in person appointments. I also discussed with the patient that there may be a patient responsible charge related to this service. By engaging in this virtual visit, you consent to the provision of healthcare.  Additionally, you authorize for your insurance to be billed for the services provided during this visit.  The patient expressed understanding and agreed to proceed.    PRENATAL VISIT NOTE  Subjective:  [name] is a 24 y.o. [id number] at [redacted]w[redacted]d  for phone visit for ongoing prenatal care.  She is currently monitored for the following issues for this high-risk pregnancy and has Incisional hernia; Supervision of other high risk pregnancy, antepartum; Grand multipara; History of preterm delivery; History of gestational hypertension; Chlamydia infection during pregnancy; Obesity in pregnancy; BMI 37.0-37.9, adult; Back pain affecting pregnancy in second trimester; and Elevated liver enzymes on their problem list.  Patient reports pelvic pain unchanged from previous.  Contractions: Irregular. Vag. Bleeding: None.  Movement: Present. Denies leaking of fluid.   The following portions of the patient's history were reviewed and updated as appropriate: allergies, current medications, past family history, past medical history, past social history, past surgical history and problem list.   Objective:   Vitals:   07/14/20 0828  BP: 134/75  Pulse: 78   Self-Obtained  Fetal Status:     Movement: Present      Assessment and Plan:  Pregnancy: [id number] at [redacted]w[redacted]d 1. Supervision of other high risk pregnancy, antepartum Patient is doing well Pelvic pain stable. Follow up as scheduled with PT  2. Obesity in pregnancy Continue ASA Follow up growth ultrasound in October  3. History of gestational hypertension Normotensive  4. History of preterm delivery Patient discontinued 17-P  Preterm labor symptoms and general obstetric precautions including but not limited to vaginal bleeding, contractions, leaking of fluid and fetal movement were reviewed in detail with the patient.  Return in about 2 weeks (around 07/28/2020).  Future Appointments  Date Time Provider Department Center  07/21/2020  9:15 AM Aletha Halim, PT [facility] None  07/30/2020  8:00 AM Teague Edwena Blow, PA-C [facility]  08/10/2020 10:45 AM [facility] NURSE [facility]  08/10/2020 11:00 AM [facility] US1 [facility]     Time spent on virtual visit: 15 minutes  Catalina Antigua, MD

## 2020-07-21 ENCOUNTER — Ambulatory Visit (HOSPITAL_COMMUNITY): Payer: Medicaid Other | Attending: Obstetrics and Gynecology | Admitting: Physical Therapy

## 2020-07-23 ENCOUNTER — Inpatient Hospital Stay (HOSPITAL_COMMUNITY)
Admission: AD | Admit: 2020-07-23 | Discharge: 2020-07-23 | Disposition: A | Discharge status: Home / Self Care | Payer: Medicaid Other | Attending: Obstetrics and Gynecology | Admitting: Obstetrics and Gynecology

## 2020-07-23 ENCOUNTER — Other Ambulatory Visit: Payer: Self-pay

## 2020-07-23 ENCOUNTER — Encounter (HOSPITAL_COMMUNITY): Payer: Self-pay | Admitting: Obstetrics and Gynecology

## 2020-07-23 ENCOUNTER — Encounter: Payer: Medicaid Other | Admitting: Physical Therapy

## 2020-07-23 DIAGNOSIS — A749 Chlamydial infection, unspecified: Secondary | ICD-10-CM

## 2020-07-23 DIAGNOSIS — O2343 Unspecified infection of urinary tract in pregnancy, third trimester: Secondary | ICD-10-CM | POA: Diagnosis not present

## 2020-07-23 DIAGNOSIS — Z79899 Other long term (current) drug therapy: Secondary | ICD-10-CM | POA: Diagnosis not present

## 2020-07-23 DIAGNOSIS — O09899 Supervision of other high risk pregnancies, unspecified trimester: Secondary | ICD-10-CM

## 2020-07-23 DIAGNOSIS — Z3A32 32 weeks gestation of pregnancy: Secondary | ICD-10-CM | POA: Diagnosis not present

## 2020-07-23 DIAGNOSIS — R748 Abnormal levels of other serum enzymes: Secondary | ICD-10-CM

## 2020-07-23 DIAGNOSIS — O26833 Pregnancy related renal disease, third trimester: Secondary | ICD-10-CM | POA: Diagnosis not present

## 2020-07-23 DIAGNOSIS — Z8759 Personal history of other complications of pregnancy, childbirth and the puerperium: Secondary | ICD-10-CM

## 2020-07-23 DIAGNOSIS — F329 Major depressive disorder, single episode, unspecified: Secondary | ICD-10-CM | POA: Diagnosis not present

## 2020-07-23 DIAGNOSIS — O09893 Supervision of other high risk pregnancies, third trimester: Secondary | ICD-10-CM | POA: Diagnosis not present

## 2020-07-23 DIAGNOSIS — Z7982 Long term (current) use of aspirin: Secondary | ICD-10-CM | POA: Insufficient documentation

## 2020-07-23 DIAGNOSIS — O99210 Obesity complicating pregnancy, unspecified trimester: Secondary | ICD-10-CM

## 2020-07-23 DIAGNOSIS — O99213 Obesity complicating pregnancy, third trimester: Secondary | ICD-10-CM | POA: Diagnosis not present

## 2020-07-23 DIAGNOSIS — N189 Chronic kidney disease, unspecified: Secondary | ICD-10-CM | POA: Insufficient documentation

## 2020-07-23 DIAGNOSIS — O99343 Other mental disorders complicating pregnancy, third trimester: Secondary | ICD-10-CM | POA: Insufficient documentation

## 2020-07-23 DIAGNOSIS — O4703 False labor before 37 completed weeks of gestation, third trimester: Secondary | ICD-10-CM

## 2020-07-23 DIAGNOSIS — O09293 Supervision of pregnancy with other poor reproductive or obstetric history, third trimester: Secondary | ICD-10-CM | POA: Insufficient documentation

## 2020-07-23 LAB — URINALYSIS, ROUTINE W REFLEX MICROSCOPIC
Bilirubin Urine: NEGATIVE
Glucose, UA: NEGATIVE mg/dL
Hgb urine dipstick: NEGATIVE
Ketones, ur: NEGATIVE mg/dL
Nitrite: NEGATIVE
Protein, ur: NEGATIVE mg/dL
Specific Gravity, Urine: 1.024 (ref 1.005–1.030)
pH: 6 (ref 5.0–8.0)

## 2020-07-23 LAB — CBC
HCT: 34.9 % — ABNORMAL LOW (ref 36.0–46.0)
Hemoglobin: 11.6 g/dL — ABNORMAL LOW (ref 12.0–15.0)
MCH: 31.7 pg (ref 26.0–34.0)
MCHC: 33.2 g/dL (ref 30.0–36.0)
MCV: 95.4 fL (ref 80.0–100.0)
Platelets: 217 10*3/uL (ref 150–400)
RBC: 3.66 MIL/uL — ABNORMAL LOW (ref 3.87–5.11)
RDW: 13.2 % (ref 11.5–15.5)
WBC: 8.3 10*3/uL (ref 4.0–10.5)
nRBC: 0 % (ref 0.0–0.2)

## 2020-07-23 LAB — COMPREHENSIVE METABOLIC PANEL
ALT: 38 U/L (ref 0–44)
AST: 34 U/L (ref 15–41)
Albumin: 2.7 g/dL — ABNORMAL LOW (ref 3.5–5.0)
Alkaline Phosphatase: 103 U/L (ref 38–126)
Anion gap: 9 (ref 5–15)
BUN: 6 mg/dL (ref 6–20)
CO2: 23 mmol/L (ref 22–32)
Calcium: 8.7 mg/dL — ABNORMAL LOW (ref 8.9–10.3)
Chloride: 107 mmol/L (ref 98–111)
Creatinine, Ser: 0.54 mg/dL (ref 0.44–1.00)
GFR calc Af Amer: >60 mL/min (ref >60)
GFR calc non Af Amer: >60 mL/min (ref >60)
Glucose, Bld: 95 mg/dL (ref 70–99)
Potassium: 4.1 mmol/L (ref 3.5–5.1)
Sodium: 139 mmol/L (ref 135–145)
Total Bilirubin: 0.3 mg/dL (ref 0.3–1.2)
Total Protein: 6 g/dL — ABNORMAL LOW (ref 6.5–8.1)

## 2020-07-23 LAB — FETAL FIBRONECTIN: Fetal Fibronectin: NEGATIVE

## 2020-07-23 LAB — WET PREP, GENITAL
Clue Cells Wet Prep HPF POC: NONE SEEN
Sperm: NONE SEEN
Trich, Wet Prep: NONE SEEN
Yeast Wet Prep HPF POC: NONE SEEN

## 2020-07-23 MED ORDER — NIFEDIPINE 10 MG PO CAPS
10.0000 mg | ORAL_CAPSULE | ORAL | Status: DC | PRN
  Administered 2020-07-23 (×3): 10 mg via ORAL
  Filled 2020-07-23 (×3): qty 1

## 2020-07-23 MED ORDER — NIFEDIPINE ER OSMOTIC RELEASE 30 MG PO TB24
30.0000 mg | ORAL_TABLET | Freq: Every day | ORAL | 1 refills | Status: DC
Start: 2020-07-23 — End: 2020-09-03

## 2020-07-23 MED ORDER — NITROFURANTOIN MONOHYD MACRO 100 MG PO CAPS: 100.0000 mg | ORAL_CAPSULE | Freq: Two times a day (BID) | ORAL | 0 refills | Status: DC

## 2020-07-23 NOTE — MAU Note (Signed)
Presents with c/o ctxs, feels like period cramps.  States been having cramping for a couple weeks but it's getting worse.  Also reports having increased pressure in hips.  Denies VB. Or LOF, states mucus discharge.  Endorses +FM.

## 2020-07-23 NOTE — MAU Provider Note (Signed)
Chief Complaint:  Contractions and Pressure   First Provider Initiated Contact with Patient 07/23/20 1055     HPI: [name] is a 24 y.o. [id number] at 8w4dwho presents to maternity admissions reporting urinary urgency/frequency with decreased amount of urine, lower abdominal cramping and contractions that have been increasing over the past week. She was feeling the contractions about every 114m but has stopped timing them. She states that they mostly feel like cramping and sometimes get sharper. She was taking 17P until 2-3 weeks ago, she stopped taking it because it was making her sick. Denies vaginal bleeding, leaking of fluid, decreased fetal movement, fever, falls, or recent illness.   Pregnancy Course: Elevated LFTs, hx preterm birth  Past Medical History:  Diagnosis Date  . Anxiety   . Chlamydia 2018, 2021  . Chronic kidney disease    kidney stones and kidney infection with last pregnancy  . Depression    "gets in her moods, currently ok'  . Gonorrhea 2018  . Headache   . Incisional hernia 04/2013  . Infection    UTI  . LGSIL on Pap smear of cervix 2019  . Ovarian cyst   . Urethral diverticulum    OB History  Gravida Para Term Preterm AB Living  _0 SAB TAB Ectopic Multiple Live Births  3 0 0 0 4    # Outcome Date GA Lbr Len/2nd Weight Sex Delivery Anes PTL Lv  8 Current           7 SAB 2021          6 SAB 2020          5 SAB 2020          4 Term 01/28/18 [id number]    LIV  3 Preterm 04/09/15 [id number] 3 oz (3.259 kg) M Vag-Spont   LIV  2 Preterm 04/13/14 [id number] 14 oz (3.118 kg) M Vag-Spont   LIV     Birth Comments: System Generated. Please review and update pregnancy details.  1 Preterm 02/20/12 [id number] / 00:15 6 lb 11.2 oz (3.04 kg) M Vag-Spont EPI  LIV     Complications: Gestational hypertension   Past Surgical History:  Procedure Laterality Date  . CHOLECYSTECTOMY  04/03/2012   Procedure: LAPAROSCOPIC CHOLECYSTECTOMY WITH  INTRAOPERATIVE CHOLANGIOGRAM;  Surgeon: Todd MEarnstine Regal Location: [facility];  Service: General;  Laterality: N/A;  . ERCP  03/12/2012   Procedure: ENDOSCOPIC RETROGRADE CHOLANGIOPANCREATOGRAPHY (ERCP);  Surgeon: Marc EJeryl [place] [facility]: [facility];[place];  Laterality: N/A;  . INGUINAL HERNIA REPAIR N/A 05/15/2013   Procedure: HERNIA REPAIR INCISIONAL;  Surgeon: Todd MEarnstine Regal [place] [facility] [place];  Laterality: N/A;  . INSERTION OF MESH N/A 05/15/2013   Procedure: INSERTION OF MESH;  Surgeon: Todd MEarnstine Regal Location: [facility] [place]: General;  Laterality: N/A;  . URETERAL STENT ArjayWISDOM TOOTH EXTRACTION     Family History  Problem Relation Age of Onset  . Hypertension Mother   . Hypertension Father   . Diabetes Paternal Grandmother   . Cancer Paternal Grandfather   . Seizures Paternal Grandfather    Social History   Tobacco Use  . Smoking status: Never Smoker  . Smokeless tobacco: Never Used  Vaping Use  . Vaping Use: Never used  Substance Use Topics  .  Alcohol use: Not Currently    Comment: last drink 2 yrs ago  . Drug use: No   Allergies  Allergen Reactions  . Penicillins Anaphylaxis, Itching, Swelling, Rash and Other (See Comments)    Has patient had a PCN reaction causing immediate rash, facial/tongue/throat swelling, SOB or lightheadedness with hypotension: Yes Has patient had a PCN reaction causing severe rash involving mucus membranes or skin necrosis: No Has patient had a PCN reaction that required hospitalization: No Has patient had a PCN reaction occurring within the last 10 years: No If all of the above answers are "NO", then may proceed with Cephalosporin use.  And swelling   Medications Prior to Admission  Medication Sig Dispense Refill Last Dose  . aspirin EC 81 MG tablet Take 1 tablet (81 mg total) by mouth daily. Swallow whole. 30 tablet 11 07/22/2020 at 0800  . citalopram  (CELEXA) 20 MG tablet Take 1/2 tablet daily for one week then take 1 tablet daily 60 tablet 1 07/22/2020 at 2100  . Multiple Vitamin (MULTIVITAMIN WITH MINERALS) TABS tablet Take 1 tablet by mouth daily.   07/23/2020 at 0800  . Acetaminophen (TYLENOL) 325 MG CAPS Take 1,000 mg by mouth. (Patient not taking: Reported on 06/30/2020)     . Blood Pressure Monitor KIT 1 Device by Does not apply route once a week. To be monitored Regularly at home. 1 kit 0   . cyclobenzaprine (FLEXERIL) 10 MG tablet Take 1 tablet (10 mg total) by mouth every 8 (eight) hours as needed (back pain). (Patient not taking: Reported on 06/30/2020) 30 tablet 0   . Elastic Bandages & Supports (COMFORT FIT MATERNITY SUPP LG) MISC 1 application by Does not apply route daily. 1 each 0     I have reviewed patient's Past Medical Hx, Surgical Hx, Family Hx, Social Hx, medications and allergies.   ROS:  Review of Systems  Constitutional: Negative for fatigue and fever.  Respiratory: Negative for shortness of breath.   Gastrointestinal: Negative for constipation, nausea and vomiting.  Endocrine: Negative for polydipsia, polyphagia and polyuria.  Genitourinary: Positive for decreased urine volume, frequency, pelvic pain (cramping sometimes on L side, mostly equal to both sides) and urgency. Negative for vaginal bleeding and vaginal discharge.  Neurological: Negative for dizziness and headaches.  All other systems reviewed and are negative.   Physical Exam   Patient Vitals for the past 24 hrs:  BP Temp Temp src Pulse Resp SpO2 Height Weight  07/23/20 1538 129/61 -- -- (!) 114 16 -- -- --  07/23/20 1450 122/71 -- -- 94 -- -- -- --  07/23/20 1434 128/63 -- -- 82 -- -- -- --  07/23/20 1335 (!) 128/59 -- -- 88 -- -- -- --  07/23/20 1230 129/65 -- -- 92 -- -- -- --  07/23/20 1124 129/67 -- -- 84 -- -- -- --  07/23/20 1050 -- -- -- -- -- 99 % -- --  07/23/20 1037 126/71 98.5 F (36.9 C) Oral (!) 109 20 99 % -- --  07/23/20 1024 -- --  -- -- -- -- _0  (1.651 m) 222 lb 11.2 oz (101 kg)    Constitutional: Well-developed, well-nourished female in no acute distress.  Cardiovascular: normal rate & rhythm, no murmur Respiratory: normal effort, lung sounds clear throughout GI: Abd soft, non-tender, gravid appropriate for gestational age. Pos BS x 4 MS: Extremities nontender, no edema, normal ROM Neurologic: Alert and oriented x 4.  Pelvic: NEFG, physiologic discharge, no blood, cervix clean.  Initial cervical exam: 1cm, thick and ballotable   Dilation: Fingertip Effacement (%): Thick Exam by:: Candie Chroman, CNM  2nd exam proved cervix to be externally 1cm, but closed internally.  Fetal Tracing: reactive Baseline: 145 Variability: moderate Accelerations: present Decelerations: none Toco: UI   Labs: Results for orders placed or performed during the hospital encounter of 07/23/20 (from the past 24 hour(s))  Urinalysis, Routine w reflex microscopic Urine, Clean Catch     Status: Abnormal   Collection Time: 07/23/20 11:04 AM  Result Value Ref Range   Color, Urine YELLOW YELLOW   APPearance HAZY (A) CLEAR   Specific Gravity, Urine 1.024 1.005 - 1.030   pH 6.0 5.0 - 8.0   Glucose, UA NEGATIVE NEGATIVE mg/dL   Hgb urine dipstick NEGATIVE NEGATIVE   Bilirubin Urine NEGATIVE NEGATIVE   Ketones, ur NEGATIVE NEGATIVE mg/dL   Protein, ur NEGATIVE NEGATIVE mg/dL   Nitrite NEGATIVE NEGATIVE   Leukocytes,Ua MODERATE (A) NEGATIVE   RBC / HPF 0-5 0 - 5 RBC/hpf   WBC, UA 6-10 0 - 5 WBC/hpf   Bacteria, UA FEW (A) NONE SEEN   Squamous Epithelial / LPF 11-20 0 - 5   Mucus PRESENT   Wet prep, genital     Status: Abnormal   Collection Time: 07/23/20 11:05 AM  Result Value Ref Range   Yeast Wet Prep HPF POC NONE SEEN NONE SEEN   Trich, Wet Prep NONE SEEN NONE SEEN   Clue Cells Wet Prep HPF POC NONE SEEN NONE SEEN   WBC, Wet Prep HPF POC MANY (A) NONE SEEN   Sperm NONE SEEN   Fetal fibronectin     Status: None   Collection  Time: 07/23/20 11:09 AM  Result Value Ref Range   Fetal Fibronectin NEGATIVE NEGATIVE  CBC     Status: Abnormal   Collection Time: 07/23/20 11:29 AM  Result Value Ref Range   WBC 8.3 4.0 - 10.5 K/uL   RBC 3.66 (L) 3.87 - 5.11 MIL/uL   Hemoglobin 11.6 (L) 12.0 - 15.0 g/dL   HCT 34.9 (L) 36 - 46 %   MCV 95.4 80.0 - 100.0 fL   MCH 31.7 26.0 - 34.0 pg   MCHC 33.2 30.0 - 36.0 g/dL   RDW 13.2 11.5 - 15.5 %   Platelets 217 150 - 400 K/uL   nRBC 0.0 0.0 - 0.2 %  Comprehensive metabolic panel     Status: Abnormal   Collection Time: 07/23/20 11:29 AM  Result Value Ref Range   Sodium 139 135 - 145 mmol/L   Potassium 4.1 3.5 - 5.1 mmol/L   Chloride 107 98 - 111 mmol/L   CO2 23 22 - 32 mmol/L   Glucose, Bld 95 70 - 99 mg/dL   BUN 6 6 - 20 mg/dL   Creatinine, Ser 0.54 0.44 - 1.00 mg/dL   Calcium 8.7 (L) 8.9 - 10.3 mg/dL   Total Protein 6.0 (L) 6.5 - 8.1 g/dL   Albumin 2.7 (L) 3.5 - 5.0 g/dL   AST 34 15 - 41 U/L   ALT 38 0 - 44 U/L   Alkaline Phosphatase 103 38 - 126 U/L   Total Bilirubin 0.3 0.3 - 1.2 mg/dL   GFR calc non Af Amer >60 >60 mL/min   GFR calc Af Amer >60 >60 mL/min   Anion gap 9 5 - 15    Imaging:  No results found.  MAU Course: Orders Placed This Encounter  Procedures  . Wet prep, genital  .  Culture, OB Urine  . Urinalysis, Routine w reflex microscopic Urine, Clean Catch  . Fetal fibronectin  . CBC  . Comprehensive metabolic panel  . Discharge patient   Meds ordered this encounter  Medications  . NIFEdipine (PROCARDIA) capsule 10 mg  . NIFEdipine (PROCARDIA XL) 30 MG 24 hr tablet    Sig: Take 1 tablet (30 mg total) by mouth daily.    Dispense:  30 tablet    Refill:  1    Order Specific Question:   Supervising Provider    Answer:   Donnamae Jude [[id number]]  . nitrofurantoin, macrocrystal-monohydrate, (MACROBID) 100 MG capsule    Sig: Take 1 capsule (100 mg total) by mouth 2 (two) times daily.    Dispense:  14 capsule    Refill:  0    Order Specific  Question:   Supervising Provider    Answer:   Donnamae Jude [[id number]]    MDM: FFN negative, wet prep normal, GC/CT pending 3 doses of Procardia helped ease contractions somewhat, also no cervical change UA = moderate WBCs plus some bacteria, sent for culture but will treat empirically based on symptoms  Assessment: 1. Preterm uterine contractions in third trimester, antepartum   2. Elevated liver enzymes   3. Chlamydia infection during pregnancy   4. Obesity in pregnancy   5. History of gestational hypertension   6. Supervision of other high risk pregnancy, antepartum   7. UTI (urinary tract infection) during pregnancy, third trimester   8. [redacted] weeks gestation of pregnancy     Plan: Discharge home in stable condition with preterm labor precautions.  Procardia XL daily given for preterm contractions + antibiotics for UTI Pt amenable to treatment plan and will follow up at [facility].    Follow-up Information    [facility]. Go to.   Specialty: Obstetrics and Gynecology Why: as scheduled for ongoing prenatal care Contact information: [address], [place]              Allergies as of 07/23/2020      Reactions   Penicillins Anaphylaxis, Itching, Swelling, Rash, Other (See Comments)   Has patient had a PCN reaction causing immediate rash, facial/tongue/throat swelling, SOB or lightheadedness with hypotension: Yes Has patient had a PCN reaction causing severe rash involving mucus membranes or skin necrosis: No Has patient had a PCN reaction that required hospitalization: No Has patient had a PCN reaction occurring within the last 10 years: No If all of the above answers are "NO", then may proceed with Cephalosporin use.  And swelling      Medication List    TAKE these medications   aspirin EC 81 MG tablet Take 1 tablet (81 mg total) by mouth daily. Swallow whole.   Blood Pressure Monitor  Kit 1 Device by Does not apply route once a week. To be monitored Regularly at home.   citalopram 20 MG tablet Commonly known as: CELEXA Take 1/2 tablet daily for one week then take 1 tablet daily   [place] 1 application by Does not apply route daily.   cyclobenzaprine 10 MG tablet Commonly known as: FLEXERIL Take 1 tablet (10 mg total) by mouth every 8 (eight) hours as needed (back pain).   multivitamin with minerals Tabs tablet Take 1 tablet by mouth daily.   NIFEdipine 30 MG 24 hr tablet Commonly known as: Procardia XL Take 1 tablet (30 mg total)  by mouth daily.   nitrofurantoin (macrocrystal-monohydrate) 100 MG capsule Commonly known as: MACROBID Take 1 capsule (100 mg total) by mouth 2 (two) times daily.   Tylenol 325 MG Caps Generic drug: Acetaminophen Take 1,000 mg by mouth.      Gaylan Gerold, CNM, MSN, [facility] 07/23/20 3:46 PM

## 2020-07-23 NOTE — Discharge Instructions (Signed)
Preventing Preterm Birth Preterm birth is when your baby is delivered between 20 weeks and 37 weeks of pregnancy. A full-term pregnancy lasts for at least 37 weeks. Preterm birth can be dangerous for your baby because the last few weeks of pregnancy are an important time for your baby's brain and lungs to grow. Many things can cause a baby to be born early. Sometimes the cause is not known. There are certain factors that make you more likely to experience preterm birth, such as:  Having a previous baby born preterm.  Being pregnant with twins or other multiples.  Having had fertility treatment.  Being overweight or underweight at the start of your pregnancy.  Having any of the following during pregnancy: ? An infection, including a urinary tract infection (UTI) or an STI (sexually transmitted infection). ? High blood pressure. ? Diabetes. ? Vaginal bleeding.  Being age 35 or older.  Being age 18 or younger.  Getting pregnant within 6 months of a previous pregnancy.  Suffering extreme stress or physical or emotional abuse during pregnancy.  Standing for long periods of time during pregnancy, such as working at a job that requires standing. What are the risks? The most serious risk of preterm birth is that the baby may not survive. This is more likely to happen if a baby is born before 34 weeks. Other risks and complications of preterm birth may include your baby having:  Breathing problems.  Brain damage that affects movement and coordination (cerebral palsy).  Feeding difficulties.  Vision or hearing problems.  Infections or inflammation of the digestive tract (colitis).  Developmental delays.  Learning disabilities.  Higher risk for diabetes, heart disease, and high blood pressure later in life. What can I do to lower my risk?  Medical care The most important thing you can do to lower your risk for preterm birth is to get routine medical care during pregnancy (prenatal  care). If you have a high risk of preterm birth, you may be referred to a health care provider who specializes in managing high-risk pregnancies (perinatologist). You may be given medicine to help prevent preterm birth. Lifestyle changes Certain lifestyle changes can also lower your risk of preterm birth:  Wait at least 6 months after a pregnancy to become pregnant again.  Try to plan pregnancy for when you are between 19 and 35 years old.  Get to a healthy weight before getting pregnant. If you are overweight, work with your health care provider to safely lose weight.  Do not use any products that contain nicotine or tobacco, such as cigarettes and e-cigarettes. If you need help quitting, ask your health care provider.  Do not drink alcohol.  Do not use drugs. Where to find support For more support, consider:  Talking with your health care provider.  Talking with a therapist or substance abuse counselor, if you need help quitting.  Working with a diet and nutrition specialist (dietitian) or a personal trainer to maintain a healthy weight.  Joining a support group. Where to find more information Learn more about preventing preterm birth from:  Centers for Disease Control and Prevention: cdc.gov/reproductivehealth/maternalinfanthealth/pretermbirth.htm  March of Dimes: marchofdimes.org/complications/premature-babies.aspx  American Pregnancy Association: americanpregnancy.org/labor-and-birth/premature-labor Contact a health care provider if:  You have any of the following signs of preterm labor before 37 weeks: ? A change or increase in vaginal discharge. ? Fluid leaking from your vagina. ? Pressure or cramps in your lower abdomen. ? A backache that does not go away or gets worse. ?   Regular tightening (contractions) in your lower abdomen. Summary  Preterm birth means having your baby during weeks 20-37 of pregnancy.  Preterm birth may put your baby at risk for physical and  mental problems.  Getting good prenatal care can help prevent preterm birth.  You can lower your risk of preterm birth by making certain lifestyle changes, such as not smoking and not using alcohol. This information is not intended to replace advice given to you by your health care provider. Make sure you discuss any questions you have with your health care provider. Document Revised: 09/21/2017 Document Reviewed: 06/17/2016 Elsevier Patient Education  2020 Elsevier Inc.  

## 2020-07-24 LAB — CULTURE, OB URINE: Culture: <10000 — AB

## 2020-07-26 LAB — GC/CHLAMYDIA PROBE AMP (~~LOC~~) NOT AT ARMC
Chlamydia: NEGATIVE
Comment: NEGATIVE
Comment: NORMAL
Neisseria Gonorrhea: NEGATIVE

## 2020-07-28 ENCOUNTER — Telehealth (INDEPENDENT_AMBULATORY_CARE_PROVIDER_SITE_OTHER): Payer: Medicaid Other | Admitting: Obstetrics & Gynecology

## 2020-07-28 DIAGNOSIS — O09899 Supervision of other high risk pregnancies, unspecified trimester: Secondary | ICD-10-CM

## 2020-07-28 DIAGNOSIS — Z8759 Personal history of other complications of pregnancy, childbirth and the puerperium: Secondary | ICD-10-CM

## 2020-07-28 DIAGNOSIS — O99891 Other specified diseases and conditions complicating pregnancy: Secondary | ICD-10-CM

## 2020-07-28 DIAGNOSIS — Z3A33 33 weeks gestation of pregnancy: Secondary | ICD-10-CM

## 2020-07-28 DIAGNOSIS — R748 Abnormal levels of other serum enzymes: Secondary | ICD-10-CM

## 2020-07-28 NOTE — Patient Instructions (Signed)

## 2020-07-28 NOTE — Progress Notes (Signed)
   TELEHEALTH OBSTETRICS VISIT ENCOUNTER NOTE  I connected with [name] on 07/28/20 at  8:30 AM EDT by telephone at home and verified that I am speaking with the correct person using two identifiers.   I discussed the limitations, risks, security and privacy concerns of performing an evaluation and management service by telephone and the availability of in person appointments. I also discussed with the patient that there may be a patient responsible charge related to this service. The patient expressed understanding and agreed to proceed.  Subjective:  [name] is a 24 y.o. [phone] at [redacted]w[redacted]d being followed for ongoing prenatal care.  She is currently monitored for the following issues for this high-risk pregnancy and has Incisional hernia; Supervision of other high risk pregnancy, antepartum; Grand multipara; History of preterm delivery; History of gestational hypertension; Chlamydia infection during pregnancy; Obesity in pregnancy; BMI 37.0-37.9, adult; Back pain affecting pregnancy in second trimester; and Elevated liver enzymes on their problem list.  Patient reports occasional contractions and drowsy after taking Procardia, wants to d/c. Reports fetal movement. Denies any contractions, bleeding or leaking of fluid.   The following portions of the patient's history were reviewed and updated as appropriate: allergies, current medications, past family history, past medical history, past social history, past surgical history and problem list.   Objective:   General:  Alert, oriented and cooperative.   Mental Status: Normal mood and affect perceived. Normal judgment and thought content.  Rest of physical exam deferred due to type of encounter  Assessment and Plan:  Pregnancy: [id number] at [redacted]w[redacted]d 1. Supervision of other high risk pregnancy, antepartum Tried Procardia for preterm contractions but may stop this as she did not tolerate the medication  Preterm labor symptoms  and general obstetric precautions including but not limited to vaginal bleeding, contractions, leaking of fluid and fetal movement were reviewed in detail with the patient.  I discussed the assessment and treatment plan with the patient. The patient was provided an opportunity to ask questions and all were answered. The patient agreed with the plan and demonstrated an understanding of the instructions. The patient was advised to call back or seek an in-person office evaluation/go to [facility] at [facility] for any urgent or concerning symptoms. Please refer to After Visit Summary for other counseling recommendations.  Supervision of other high risk pregnancy, antepartum  Elevated liver enzymes  History of gestational hypertension LFT nl 5 days ago, BP has not been elevated, she reports 130s /80  I provided 10 minutes of non-face-to-face time during this encounter.  Return in about 2 weeks (around 08/11/2020).  Future Appointments  Date Time Provider Department Center  07/30/2020  8:00 AM Teague Docia Chuck [facility]  08/10/2020 10:45 AM [facility] NURSE [facility]  08/10/2020 11:00 AM [facility] US1 [facility]    Scheryl Darter, MD Center for [facility], [facility]

## 2020-07-30 ENCOUNTER — Institutional Professional Consult (permissible substitution): Payer: Medicaid Other | Admitting: Physician Assistant

## 2020-08-10 ENCOUNTER — Ambulatory Visit: Payer: Medicaid Other | Attending: Obstetrics and Gynecology

## 2020-08-10 ENCOUNTER — Encounter: Payer: Self-pay | Admitting: *Deleted

## 2020-08-10 ENCOUNTER — Ambulatory Visit: Payer: Medicaid Other | Admitting: *Deleted

## 2020-08-10 ENCOUNTER — Other Ambulatory Visit: Payer: Self-pay | Admitting: *Deleted

## 2020-08-10 ENCOUNTER — Other Ambulatory Visit: Payer: Self-pay

## 2020-08-10 DIAGNOSIS — Z6841 Body Mass Index (BMI) 40.0 and over, adult: Secondary | ICD-10-CM

## 2020-08-10 DIAGNOSIS — R748 Abnormal levels of other serum enzymes: Secondary | ICD-10-CM | POA: Insufficient documentation

## 2020-08-10 DIAGNOSIS — Z362 Encounter for other antenatal screening follow-up: Secondary | ICD-10-CM | POA: Diagnosis not present

## 2020-08-10 DIAGNOSIS — Z3A35 35 weeks gestation of pregnancy: Secondary | ICD-10-CM | POA: Diagnosis not present

## 2020-08-10 DIAGNOSIS — O99210 Obesity complicating pregnancy, unspecified trimester: Secondary | ICD-10-CM

## 2020-08-10 DIAGNOSIS — O09899 Supervision of other high risk pregnancies, unspecified trimester: Secondary | ICD-10-CM

## 2020-08-10 DIAGNOSIS — O99213 Obesity complicating pregnancy, third trimester: Secondary | ICD-10-CM

## 2020-08-10 DIAGNOSIS — O09213 Supervision of pregnancy with history of pre-term labor, third trimester: Secondary | ICD-10-CM | POA: Diagnosis not present

## 2020-08-10 DIAGNOSIS — O98819 Other maternal infectious and parasitic diseases complicating pregnancy, unspecified trimester: Secondary | ICD-10-CM

## 2020-08-10 DIAGNOSIS — O09293 Supervision of pregnancy with other poor reproductive or obstetric history, third trimester: Secondary | ICD-10-CM | POA: Diagnosis not present

## 2020-08-10 DIAGNOSIS — A749 Chlamydial infection, unspecified: Secondary | ICD-10-CM

## 2020-08-10 DIAGNOSIS — Z8759 Personal history of other complications of pregnancy, childbirth and the puerperium: Secondary | ICD-10-CM

## 2020-08-11 ENCOUNTER — Ambulatory Visit (INDEPENDENT_AMBULATORY_CARE_PROVIDER_SITE_OTHER): Payer: Medicaid Other | Admitting: Obstetrics and Gynecology

## 2020-08-11 VITALS — BP 105/74 | HR 96 | Wt 225.0 lb

## 2020-08-11 DIAGNOSIS — Z3A35 35 weeks gestation of pregnancy: Secondary | ICD-10-CM

## 2020-08-11 DIAGNOSIS — Z8759 Personal history of other complications of pregnancy, childbirth and the puerperium: Secondary | ICD-10-CM

## 2020-08-11 DIAGNOSIS — O99210 Obesity complicating pregnancy, unspecified trimester: Secondary | ICD-10-CM

## 2020-08-11 DIAGNOSIS — Z6837 Body mass index (BMI) 37.0-37.9, adult: Secondary | ICD-10-CM

## 2020-08-11 DIAGNOSIS — O09899 Supervision of other high risk pregnancies, unspecified trimester: Secondary | ICD-10-CM

## 2020-08-11 DIAGNOSIS — Z8751 Personal history of pre-term labor: Secondary | ICD-10-CM

## 2020-08-11 DIAGNOSIS — Z641 Problems related to multiparity: Secondary | ICD-10-CM

## 2020-08-11 NOTE — Progress Notes (Signed)
ROB, c/o pressure and irregular contractions.

## 2020-08-11 NOTE — Progress Notes (Signed)
° °  PRENATAL VISIT NOTE  Subjective:  [name] is a 24 y.o. [phone] at [redacted]w[redacted]d being seen today for ongoing prenatal care.  She is currently monitored for the following issues for this low-risk pregnancy and has Incisional hernia; Supervision of other high risk pregnancy, antepartum; Grand multipara; History of preterm delivery; History of gestational hypertension; Chlamydia infection during pregnancy; Obesity in pregnancy; BMI 37.0-37.9, adult; Back pain affecting pregnancy in second trimester; Elevated liver enzymes; and [redacted] weeks gestation of pregnancy on their problem list.  Patient doing well with no acute concerns today. She reports occasional contractions.  Contractions: Irregular. Vag. Bleeding: None.  Movement: Present. Denies leaking of fluid.   The following portions of the patient's history were reviewed and updated as appropriate: allergies, current medications, past family history, past medical history, past social history, past surgical history and problem list. Problem list updated.  Objective:   Vitals:   08/11/20 0818  BP: 105/74  Pulse: 96  Weight: 225 lb (102.1 kg)    Fetal Status: Fetal Heart Rate (bpm): 143   Movement: Present     General:  Alert, oriented and cooperative. Patient is in no acute distress.  Skin: Skin is warm and dry. No rash noted.   Cardiovascular: Normal heart rate noted  Respiratory: Normal respiratory effort, no problems with respiration noted  Abdomen: Soft, gravid, appropriate for gestational age.  Pain/Pressure: Present     Pelvic: Cervical exam deferred        Extremities: Normal range of motion.  Edema: Trace  Mental Status:  Normal mood and affect. Normal behavior. Normal judgment and thought content.   Assessment and Plan:  Pregnancy: [id number] at [redacted]w[redacted]d  1. Supervision of other high risk pregnancy, antepartum Other than lower pelvic pain, pt is doing well,  36 week labs next week grwoth scan reviewed EFW at 48%, due to BMI  weekly NST until delivery per MFM  2. Grand multipara Pt declines postpartum birth control, has female partner  3. History of preterm delivery No s/sx of PTL  4. History of gestational hypertension BP WNL  5. Obesity in pregnancy   6. BMI 37.0-37.9, adult   7. [redacted] weeks gestation of pregnancy   Preterm labor symptoms and general obstetric precautions including but not limited to vaginal bleeding, contractions, leaking of fluid and fetal movement were reviewed in detail with the patient.  Please refer to After Visit Summary for other counseling recommendations.   Return in about 1 week (around 08/18/2020) for ROB, in person, 36 weeks swabs.   Mariel Aloe, MD

## 2020-08-11 NOTE — Patient Instructions (Signed)

## 2020-08-19 ENCOUNTER — Ambulatory Visit: Payer: Medicaid Other | Admitting: *Deleted

## 2020-08-19 ENCOUNTER — Ambulatory Visit: Payer: Medicaid Other | Attending: Obstetrics and Gynecology

## 2020-08-19 ENCOUNTER — Other Ambulatory Visit: Payer: Self-pay

## 2020-08-19 ENCOUNTER — Ambulatory Visit (INDEPENDENT_AMBULATORY_CARE_PROVIDER_SITE_OTHER): Payer: Medicaid Other | Admitting: Obstetrics and Gynecology

## 2020-08-19 ENCOUNTER — Other Ambulatory Visit (HOSPITAL_COMMUNITY)
Admission: RE | Admit: 2020-08-19 | Discharge: 2020-08-19 | Disposition: A | Discharge status: Home / Self Care | Payer: Medicaid Other | Source: Ambulatory Visit | Attending: Obstetrics and Gynecology | Admitting: Obstetrics and Gynecology

## 2020-08-19 ENCOUNTER — Encounter: Payer: Self-pay | Admitting: *Deleted

## 2020-08-19 ENCOUNTER — Encounter: Payer: Self-pay | Admitting: Obstetrics and Gynecology

## 2020-08-19 VITALS — BP 119/83 | HR 99 | Wt 231.0 lb

## 2020-08-19 DIAGNOSIS — E669 Obesity, unspecified: Secondary | ICD-10-CM

## 2020-08-19 DIAGNOSIS — O99210 Obesity complicating pregnancy, unspecified trimester: Secondary | ICD-10-CM

## 2020-08-19 DIAGNOSIS — R748 Abnormal levels of other serum enzymes: Secondary | ICD-10-CM

## 2020-08-19 DIAGNOSIS — Z3A36 36 weeks gestation of pregnancy: Secondary | ICD-10-CM | POA: Diagnosis not present

## 2020-08-19 DIAGNOSIS — O99213 Obesity complicating pregnancy, third trimester: Secondary | ICD-10-CM | POA: Diagnosis present

## 2020-08-19 DIAGNOSIS — O98819 Other maternal infectious and parasitic diseases complicating pregnancy, unspecified trimester: Secondary | ICD-10-CM

## 2020-08-19 DIAGNOSIS — Z8751 Personal history of pre-term labor: Secondary | ICD-10-CM

## 2020-08-19 DIAGNOSIS — Z8759 Personal history of other complications of pregnancy, childbirth and the puerperium: Secondary | ICD-10-CM | POA: Diagnosis not present

## 2020-08-19 DIAGNOSIS — O09213 Supervision of pregnancy with history of pre-term labor, third trimester: Secondary | ICD-10-CM | POA: Diagnosis not present

## 2020-08-19 DIAGNOSIS — Z362 Encounter for other antenatal screening follow-up: Secondary | ICD-10-CM | POA: Insufficient documentation

## 2020-08-19 DIAGNOSIS — O99212 Obesity complicating pregnancy, second trimester: Secondary | ICD-10-CM | POA: Diagnosis not present

## 2020-08-19 DIAGNOSIS — O09899 Supervision of other high risk pregnancies, unspecified trimester: Secondary | ICD-10-CM

## 2020-08-19 DIAGNOSIS — Z6841 Body Mass Index (BMI) 40.0 and over, adult: Secondary | ICD-10-CM

## 2020-08-19 DIAGNOSIS — A749 Chlamydial infection, unspecified: Secondary | ICD-10-CM | POA: Insufficient documentation

## 2020-08-19 DIAGNOSIS — O09293 Supervision of pregnancy with other poor reproductive or obstetric history, third trimester: Secondary | ICD-10-CM | POA: Insufficient documentation

## 2020-08-19 DIAGNOSIS — Z641 Problems related to multiparity: Secondary | ICD-10-CM

## 2020-08-19 NOTE — Progress Notes (Signed)
Pt presents for ROB, GBS (PCN allergy) and GC/CT reports no complaints today.

## 2020-08-19 NOTE — Progress Notes (Signed)
° °  PRENATAL VISIT NOTE  Subjective:  [name] is a 24 y.o. [phone] at [redacted]w[redacted]d being seen today for ongoing prenatal care.  She is currently monitored for the following issues for this high-risk pregnancy and has Incisional hernia; Supervision of other high risk pregnancy, antepartum; Grand multipara; History of preterm delivery; History of gestational hypertension; Chlamydia infection during pregnancy; Obesity in pregnancy; BMI 37.0-37.9, adult; Back pain affecting pregnancy in second trimester; Elevated liver enzymes; [redacted] weeks gestation of pregnancy; and [redacted] weeks gestation of pregnancy on their problem list.  Patient doing well with no acute concerns today. She reports occasional contractions.  Contractions: Irritability. Vag. Bleeding: None.  Movement: Present. Denies leaking of fluid.   The following portions of the patient's history were reviewed and updated as appropriate: allergies, current medications, past family history, past medical history, past social history, past surgical history and problem list. Problem list updated.  Objective:   Vitals:   08/19/20 1107  BP: 119/83  Pulse: 99  Weight: 231 lb (104.8 kg)    Fetal Status: Fetal Heart Rate (bpm): 150  Fundal Height: 38 cm Movement: Present     General:  Alert, oriented and cooperative. Patient is in no acute distress.  Skin: Skin is warm and dry. No rash noted.   Cardiovascular: Normal heart rate noted  Respiratory: Normal respiratory effort, no problems with respiration noted  Abdomen: Soft, gravid, appropriate for gestational age.  Pain/Pressure: Present     Pelvic: Cervical exam performed Dilation: 1 Effacement (%): 60 Station: -3  Extremities: Normal range of motion.  Edema: Trace  Mental Status:  Normal mood and affect. Normal behavior. Normal judgment and thought content.   Assessment and Plan:  Pregnancy: [id number] at [redacted]w[redacted]d  1. [redacted] weeks gestation of pregnancy   2. Supervision of other high risk  pregnancy, antepartum Pt has BPP/ NST today secondary to obesity - Cervicovaginal ancillary only( [place]) - Strep Gp B Culture+Rflx  3. Grand multipara   4. History of preterm delivery   5. History of gestational hypertension BP WNL, no s/sx of PIH  6. Obesity in pregnancy   Preterm labor symptoms and general obstetric precautions including but not limited to vaginal bleeding, contractions, leaking of fluid and fetal movement were reviewed in detail with the patient.  Please refer to After Visit Summary for other counseling recommendations.   Return in about 1 week (around 08/26/2020) for [facility], in person.   Mariel Aloe, MD

## 2020-08-19 NOTE — Progress Notes (Signed)
Spotting from cervical exam earlier

## 2020-08-19 NOTE — Patient Instructions (Signed)

## 2020-08-20 LAB — CERVICOVAGINAL ANCILLARY ONLY
Chlamydia: NEGATIVE
Comment: NEGATIVE
Comment: NORMAL
Neisseria Gonorrhea: NEGATIVE

## 2020-08-23 LAB — STREP GP B CULTURE+RFLX: Strep Gp B Culture+Rflx: NEGATIVE

## 2020-08-24 ENCOUNTER — Ambulatory Visit (INDEPENDENT_AMBULATORY_CARE_PROVIDER_SITE_OTHER): Payer: Medicaid Other | Admitting: Obstetrics and Gynecology

## 2020-08-24 ENCOUNTER — Ambulatory Visit: Payer: Medicaid Other | Admitting: *Deleted

## 2020-08-24 ENCOUNTER — Other Ambulatory Visit: Payer: Self-pay | Admitting: Maternal & Fetal Medicine

## 2020-08-24 ENCOUNTER — Encounter: Payer: Self-pay | Admitting: *Deleted

## 2020-08-24 ENCOUNTER — Ambulatory Visit: Payer: Medicaid Other | Attending: Obstetrics and Gynecology

## 2020-08-24 ENCOUNTER — Other Ambulatory Visit: Payer: Self-pay

## 2020-08-24 VITALS — BP 127/83 | HR 99 | Wt 243.0 lb

## 2020-08-24 DIAGNOSIS — O09293 Supervision of pregnancy with other poor reproductive or obstetric history, third trimester: Secondary | ICD-10-CM | POA: Insufficient documentation

## 2020-08-24 DIAGNOSIS — Z8751 Personal history of pre-term labor: Secondary | ICD-10-CM

## 2020-08-24 DIAGNOSIS — O98819 Other maternal infectious and parasitic diseases complicating pregnancy, unspecified trimester: Secondary | ICD-10-CM | POA: Insufficient documentation

## 2020-08-24 DIAGNOSIS — O368990 Maternal care for other specified fetal problems, unspecified trimester, not applicable or unspecified: Secondary | ICD-10-CM

## 2020-08-24 DIAGNOSIS — Z3A37 37 weeks gestation of pregnancy: Secondary | ICD-10-CM | POA: Diagnosis not present

## 2020-08-24 DIAGNOSIS — O99210 Obesity complicating pregnancy, unspecified trimester: Secondary | ICD-10-CM | POA: Insufficient documentation

## 2020-08-24 DIAGNOSIS — O09899 Supervision of other high risk pregnancies, unspecified trimester: Secondary | ICD-10-CM

## 2020-08-24 DIAGNOSIS — O99213 Obesity complicating pregnancy, third trimester: Secondary | ICD-10-CM | POA: Insufficient documentation

## 2020-08-24 DIAGNOSIS — O09213 Supervision of pregnancy with history of pre-term labor, third trimester: Secondary | ICD-10-CM

## 2020-08-24 DIAGNOSIS — Z8759 Personal history of other complications of pregnancy, childbirth and the puerperium: Secondary | ICD-10-CM | POA: Diagnosis not present

## 2020-08-24 DIAGNOSIS — Z362 Encounter for other antenatal screening follow-up: Secondary | ICD-10-CM

## 2020-08-24 DIAGNOSIS — Z6841 Body Mass Index (BMI) 40.0 and over, adult: Secondary | ICD-10-CM

## 2020-08-24 DIAGNOSIS — R748 Abnormal levels of other serum enzymes: Secondary | ICD-10-CM | POA: Insufficient documentation

## 2020-08-24 DIAGNOSIS — A749 Chlamydial infection, unspecified: Secondary | ICD-10-CM | POA: Insufficient documentation

## 2020-08-24 DIAGNOSIS — Z641 Problems related to multiparity: Secondary | ICD-10-CM

## 2020-08-24 DIAGNOSIS — E669 Obesity, unspecified: Secondary | ICD-10-CM | POA: Diagnosis not present

## 2020-08-24 DIAGNOSIS — O133 Gestational [pregnancy-induced] hypertension without significant proteinuria, third trimester: Secondary | ICD-10-CM

## 2020-08-24 DIAGNOSIS — O368930 Maternal care for other specified fetal problems, third trimester, not applicable or unspecified: Secondary | ICD-10-CM | POA: Insufficient documentation

## 2020-08-24 NOTE — Patient Instructions (Signed)

## 2020-08-24 NOTE — Progress Notes (Signed)
   PRENATAL VISIT NOTE  Subjective:  [name] is a 24 y.o. [phone] at [redacted]w[redacted]d being seen today for ongoing prenatal care.  She is currently monitored for the following issues for this high-risk pregnancy and has Incisional hernia; Supervision of other high risk pregnancy, antepartum; Grand multipara; History of preterm delivery; History of gestational hypertension; Chlamydia infection during pregnancy; Obesity in pregnancy; BMI 37.0-37.9, adult; Back pain affecting pregnancy in second trimester; Elevated liver enzymes; [redacted] weeks gestation of pregnancy; [redacted] weeks gestation of pregnancy; [redacted] weeks gestation of pregnancy; and BMI 40.0-44.9, adult (HCC) on their problem list.  Patient doing well with no acute concerns today. She reports occasional contractions and passing mucus.  Contractions: Irregular. Vag. Bleeding: None.  Movement: Present. Denies leaking of fluid.   The following portions of the patient's history were reviewed and updated as appropriate: allergies, current medications, past family history, past medical history, past social history, past surgical history and problem list. Problem list updated.  Objective:   Vitals:   08/24/20 1104  BP: 127/83  Pulse: 99  Weight: 243 lb (110.2 kg)    Fetal Status: Fetal Heart Rate (bpm): 155   Movement: Present     General:  Alert, oriented and cooperative. Patient is in no acute distress.  Skin: Skin is warm and dry. No rash noted.   Cardiovascular: Normal heart rate noted  Respiratory: Normal respiratory effort, no problems with respiration noted  Abdomen: Soft, gravid, appropriate for gestational age.  Pain/Pressure: Present     Pelvic: Cervical exam deferred        Extremities: Normal range of motion.  Edema: Trace  Mental Status:  Normal mood and affect. Normal behavior. Normal judgment and thought content.   Assessment and Plan:  Pregnancy: [id number] at [redacted]w[redacted]d  1. [redacted] weeks gestation of pregnancy   2. Supervision of other  high risk pregnancy, antepartum   3. Grand multipara   4. History of preterm delivery No s/sx of labor  5. History of gestational hypertension BP normal today  6. Obesity in pregnancy Pt has weekly BPP, last BPP 8/8, has BPP today  7. BMI 40.0-44.9, adult [facility])   Term labor symptoms and general obstetric precautions including but not limited to vaginal bleeding, contractions, leaking of fluid and fetal movement were reviewed in detail with the patient.  Please refer to After Visit Summary for other counseling recommendations.   Return in about 1 week (around 08/31/2020) for [facility], in person.   Mariel Aloe, MD

## 2020-08-24 NOTE — Procedures (Signed)
[name] [birth year] [redacted]w[redacted]d  Fetus A Non-Stress Test Interpretation for 08/24/20  Indication: Unsatisfactory BPP, GHTN, BMI  Fetal Heart Rate A Mode: External Baseline Rate (A): 135 bpm Variability: Moderate Accelerations: 15 x 15 Decelerations: None Multiple birth?: No  Uterine Activity Mode: Palpation, Toco Contraction Frequency (min): None Resting Tone Palpated: Relaxed Resting Time: Adequate  Interpretation (Fetal Testing) Nonstress Test Interpretation: Reactive Comments: Dr. Judeth Cornfield reviewed tracing.

## 2020-08-26 ENCOUNTER — Telehealth: Payer: Self-pay

## 2020-08-26 NOTE — Telephone Encounter (Signed)
TC from pt reporting no FM all day.  Pt has done all measures to get baby to move.  Pt states she had U/S w/BPP on 08/24/20. Was 8/10 NST was reactive . Pt states FM decreased yesterday.  Pt advised to report to MAU. Pt agreeable and voiced understanding.

## 2020-08-31 ENCOUNTER — Ambulatory Visit (INDEPENDENT_AMBULATORY_CARE_PROVIDER_SITE_OTHER): Payer: Medicaid Other | Admitting: Obstetrics and Gynecology

## 2020-08-31 ENCOUNTER — Ambulatory Visit: Payer: Medicaid Other | Admitting: *Deleted

## 2020-08-31 ENCOUNTER — Encounter: Payer: Self-pay | Admitting: Obstetrics and Gynecology

## 2020-08-31 ENCOUNTER — Encounter: Payer: Self-pay | Admitting: *Deleted

## 2020-08-31 ENCOUNTER — Other Ambulatory Visit: Payer: Self-pay

## 2020-08-31 ENCOUNTER — Ambulatory Visit: Payer: Medicaid Other | Attending: Obstetrics and Gynecology

## 2020-08-31 VITALS — BP 129/84 | HR 108 | Wt 229.1 lb

## 2020-08-31 DIAGNOSIS — O99213 Obesity complicating pregnancy, third trimester: Secondary | ICD-10-CM | POA: Diagnosis not present

## 2020-08-31 DIAGNOSIS — Z362 Encounter for other antenatal screening follow-up: Secondary | ICD-10-CM | POA: Insufficient documentation

## 2020-08-31 DIAGNOSIS — Z3A38 38 weeks gestation of pregnancy: Secondary | ICD-10-CM | POA: Insufficient documentation

## 2020-08-31 DIAGNOSIS — R748 Abnormal levels of other serum enzymes: Secondary | ICD-10-CM | POA: Insufficient documentation

## 2020-08-31 DIAGNOSIS — Z6841 Body Mass Index (BMI) 40.0 and over, adult: Secondary | ICD-10-CM | POA: Diagnosis not present

## 2020-08-31 DIAGNOSIS — O09293 Supervision of pregnancy with other poor reproductive or obstetric history, third trimester: Secondary | ICD-10-CM | POA: Diagnosis not present

## 2020-08-31 DIAGNOSIS — O26843 Uterine size-date discrepancy, third trimester: Secondary | ICD-10-CM | POA: Insufficient documentation

## 2020-08-31 DIAGNOSIS — O99210 Obesity complicating pregnancy, unspecified trimester: Secondary | ICD-10-CM

## 2020-08-31 DIAGNOSIS — O10913 Unspecified pre-existing hypertension complicating pregnancy, third trimester: Secondary | ICD-10-CM | POA: Insufficient documentation

## 2020-08-31 DIAGNOSIS — Z641 Problems related to multiparity: Secondary | ICD-10-CM

## 2020-08-31 DIAGNOSIS — Z8759 Personal history of other complications of pregnancy, childbirth and the puerperium: Secondary | ICD-10-CM

## 2020-08-31 DIAGNOSIS — O09213 Supervision of pregnancy with history of pre-term labor, third trimester: Secondary | ICD-10-CM | POA: Diagnosis not present

## 2020-08-31 DIAGNOSIS — O09899 Supervision of other high risk pregnancies, unspecified trimester: Secondary | ICD-10-CM

## 2020-08-31 DIAGNOSIS — A749 Chlamydial infection, unspecified: Secondary | ICD-10-CM

## 2020-08-31 DIAGNOSIS — O98819 Other maternal infectious and parasitic diseases complicating pregnancy, unspecified trimester: Secondary | ICD-10-CM

## 2020-08-31 NOTE — Addendum Note (Signed)
Addended by: Lorelle Gibbs L on: 08/31/2020 11:49 AM   Modules accepted: Orders

## 2020-08-31 NOTE — Progress Notes (Signed)
States"spotting today from cervical exam @1145 "

## 2020-08-31 NOTE — Progress Notes (Signed)
   PRENATAL VISIT NOTE  Subjective:  [name] is a 24 y.o. [phone] at [redacted]w[redacted]d being seen today for ongoing prenatal care.  She is currently monitored for the following issues for this high-risk pregnancy and has Incisional hernia; Supervision of other high risk pregnancy, antepartum; Grand multipara; History of preterm delivery; History of gestational hypertension; Chlamydia infection during pregnancy; Obesity in pregnancy; BMI 37.0-37.9, adult; Back pain affecting pregnancy in second trimester; Elevated liver enzymes; [redacted] weeks gestation of pregnancy; [redacted] weeks gestation of pregnancy; [redacted] weeks gestation of pregnancy; and BMI 40.0-44.9, adult (HCC) on their problem list.  Patient reports occasional contractions.  Contractions: Irregular. Vag. Bleeding: None.  Movement: Present. Denies leaking of fluid.   The following portions of the patient's history were reviewed and updated as appropriate: allergies, current medications, past family history, past medical history, past social history, past surgical history and problem list.   Objective:   Vitals:   08/31/20 1103  BP: 129/84  Pulse: (!) 108  Weight: 229 lb 1.6 oz (103.9 kg)    Fetal Status: Fetal Heart Rate (bpm): 148 Fundal Height: 41 cm Movement: Present     General:  Alert, oriented and cooperative. Patient is in no acute distress.  Skin: Skin is warm and dry. No rash noted.   Cardiovascular: Normal heart rate noted  Respiratory: Normal respiratory effort, no problems with respiration noted  Abdomen: Soft, gravid, appropriate for gestational age.  Mild RUQ tenderness.  Pain/Pressure: Present     Pelvic: Cervical exam performed in the presence of a chaperone Dilation: 3 Effacement (%): 60 Station: -2  Extremities: Normal range of motion.  Edema: Trace  Mental Status: Normal mood and affect. Normal behavior. Normal judgment and thought content.   Assessment and Plan:  Pregnancy: [id number] at [redacted]w[redacted]d 1. [redacted] weeks gestation of  pregnancy   2. Supervision of other high risk pregnancy, antepartum - Position difficult to assess via Leopold's.   - Will confirm position at today's ultrasound. - Size greater than dates.  Will add growth scan today's ultrasound appointment.    3. Grand multipara   4. History of gestational hypertension - PreEclampsia precautions given. - Mild RUQ pain today and h/o elevated LFTs this pregnancy.  Will recheck LFTs today.    5. Obesity in pregnancy - Patient for BPP today.    6. Elevated liver enzymes - Will repeat today.   - Hepatic function panel  Term labor symptoms and general obstetric precautions including but not limited to vaginal bleeding, contractions, leaking of fluid and fetal movement were reviewed in detail with the patient. Please refer to After Visit Summary for other counseling recommendations.   Return in about 1 week (around 09/07/2020) for [facility].  Future Appointments  Date Time Provider Department Center  08/31/2020 12:45 PM [facility] NURSE [facility]  08/31/2020  1:00 PM [facility] US1 [facility]  09/07/2020  1:00 PM Johnny Bridge, MD [facility] None    Johnny Bridge, MD

## 2020-09-01 ENCOUNTER — Encounter (HOSPITAL_COMMUNITY): Payer: Self-pay | Admitting: Family Medicine

## 2020-09-01 ENCOUNTER — Other Ambulatory Visit: Payer: Self-pay

## 2020-09-01 ENCOUNTER — Inpatient Hospital Stay (HOSPITAL_COMMUNITY)
Admission: AD | Admit: 2020-09-01 | Discharge: 2020-09-01 | Disposition: A | Discharge status: Home / Self Care | Payer: Medicaid Other | Source: Home / Self Care | Attending: Obstetrics and Gynecology | Admitting: Obstetrics and Gynecology

## 2020-09-01 ENCOUNTER — Inpatient Hospital Stay (HOSPITAL_COMMUNITY)
Admission: AD | Admit: 2020-09-01 | Discharge: 2020-09-03 | DRG: 807 | Disposition: A | Discharge status: Home / Self Care | Payer: Medicaid Other | Attending: Family Medicine | Admitting: Family Medicine

## 2020-09-01 ENCOUNTER — Encounter (HOSPITAL_COMMUNITY): Payer: Self-pay | Admitting: Obstetrics and Gynecology

## 2020-09-01 ENCOUNTER — Telehealth: Payer: Self-pay

## 2020-09-01 DIAGNOSIS — O139 Gestational [pregnancy-induced] hypertension without significant proteinuria, unspecified trimester: Secondary | ICD-10-CM | POA: Diagnosis not present

## 2020-09-01 DIAGNOSIS — Z20822 Contact with and (suspected) exposure to covid-19: Secondary | ICD-10-CM | POA: Diagnosis present

## 2020-09-01 DIAGNOSIS — A749 Chlamydial infection, unspecified: Secondary | ICD-10-CM | POA: Diagnosis present

## 2020-09-01 DIAGNOSIS — Z641 Problems related to multiparity: Secondary | ICD-10-CM

## 2020-09-01 DIAGNOSIS — Z3A38 38 weeks gestation of pregnancy: Secondary | ICD-10-CM | POA: Insufficient documentation

## 2020-09-01 DIAGNOSIS — O471 False labor at or after 37 completed weeks of gestation: Secondary | ICD-10-CM | POA: Insufficient documentation

## 2020-09-01 DIAGNOSIS — Z8759 Personal history of other complications of pregnancy, childbirth and the puerperium: Secondary | ICD-10-CM

## 2020-09-01 DIAGNOSIS — O479 False labor, unspecified: Secondary | ICD-10-CM

## 2020-09-01 DIAGNOSIS — O09899 Supervision of other high risk pregnancies, unspecified trimester: Secondary | ICD-10-CM

## 2020-09-01 DIAGNOSIS — O99210 Obesity complicating pregnancy, unspecified trimester: Secondary | ICD-10-CM

## 2020-09-01 DIAGNOSIS — O98819 Other maternal infectious and parasitic diseases complicating pregnancy, unspecified trimester: Secondary | ICD-10-CM

## 2020-09-01 DIAGNOSIS — Z880 Allergy status to penicillin: Secondary | ICD-10-CM

## 2020-09-01 DIAGNOSIS — Z8751 Personal history of pre-term labor: Secondary | ICD-10-CM

## 2020-09-01 DIAGNOSIS — R748 Abnormal levels of other serum enzymes: Secondary | ICD-10-CM | POA: Diagnosis present

## 2020-09-01 DIAGNOSIS — O43123 Velamentous insertion of umbilical cord, third trimester: Secondary | ICD-10-CM | POA: Diagnosis present

## 2020-09-01 DIAGNOSIS — O134 Gestational [pregnancy-induced] hypertension without significant proteinuria, complicating childbirth: Secondary | ICD-10-CM | POA: Diagnosis present

## 2020-09-01 DIAGNOSIS — N189 Chronic kidney disease, unspecified: Secondary | ICD-10-CM | POA: Diagnosis present

## 2020-09-01 DIAGNOSIS — K432 Incisional hernia without obstruction or gangrene: Secondary | ICD-10-CM | POA: Diagnosis present

## 2020-09-01 DIAGNOSIS — O99892 Other specified diseases and conditions complicating childbirth: Secondary | ICD-10-CM | POA: Diagnosis present

## 2020-09-01 DIAGNOSIS — Z6837 Body mass index (BMI) 37.0-37.9, adult: Secondary | ICD-10-CM

## 2020-09-01 LAB — HEPATIC FUNCTION PANEL
ALT: 13 IU/L (ref 0–32)
AST: 19 IU/L (ref 0–40)
Albumin: 3.9 g/dL (ref 3.9–5.0)
Alkaline Phosphatase: 193 IU/L — ABNORMAL HIGH (ref 44–121)
Bilirubin Total: <0.2 mg/dL (ref 0.0–1.2)
Bilirubin, Direct: <0.1 mg/dL (ref 0.00–0.40)
Total Protein: 6.8 g/dL (ref 6.0–8.5)

## 2020-09-01 LAB — POCT FERN TEST

## 2020-09-01 NOTE — H&P (Addendum)
OBSTETRIC ADMISSION HISTORY AND PHYSICAL  [name] is a 24 y.o. female [phone] with IUP at 75w2dby early u/s presenting for SOL. She reports +FMs, No LOF, no VB, no blurry vision, headaches or peripheral edema, and RUQ pain.  She plans on breast and bottle feeding. She declines birth control due to same sex partner relationship. [facility] --->  Estimated Date of Delivery: 09/13/20  Sono:    08/31/20@[redacted]w[redacted]d , CWD, normal anatomy, cephalic presentation, [id number] 80% EFW  Prenatal History/Complications:  History of gHTN (G1) History of preterm delivery x4 BMI 38 Chlamydia in pregnancy (neg TOC 03/2020) Transaminitis (nml  08/31/20)  Past Medical History: Past Medical History:  Diagnosis Date  . Anxiety   . Chlamydia 2018, 2021  . Chronic kidney disease    kidney stones and kidney infection with last pregnancy  . Depression    "gets in her moods, currently ok'  . Gonorrhea 2018  . Headache   . Incisional hernia 04/2013  . Infection    UTI  . LGSIL on Pap smear of cervix 2019  . Ovarian cyst   . Urethral diverticulum     Past Surgical History: Past Surgical History:  Procedure Laterality Date  . CHOLECYSTECTOMY  04/03/2012   Procedure: LAPAROSCOPIC CHOLECYSTECTOMY WITH INTRAOPERATIVE CHOLANGIOGRAM;  Surgeon: TEarnstine Regal MD;  Location: [facility];  Service: General;  Laterality: N/A;  . ERCP  03/12/2012   Procedure: ENDOSCOPIC RETROGRADE CHOLANGIOPANCREATOGRAPHY (ERCP);  Surgeon: MJeryl Columbia MD;  [place] [facility]  Service: Endoscopy;  Laterality: N/A;  . INGUINAL HERNIA REPAIR N/A 05/15/2013   Procedure: HERNIA REPAIR INCISIONAL;  Surgeon: TEarnstine Regal MD;  [place] [facility]  Service: General;  Laterality: N/A;  . INSERTION OF MESH N/A 05/15/2013   Procedure: INSERTION OF MESH;  Surgeon: TEarnstine Regal MD;  [place] [facility]  Service: General;  Laterality: N/A;  . URETERAL [place]   . WISDOM TOOTH EXTRACTION      Obstetrical History: OB History    Gravida  8   Para  4   Term  1   Preterm  3   AB  3   Living  4     SAB  3   TAB  0   Ectopic  0   Multiple  0   Live Births  4           Social History Social History   Socioeconomic History  . Marital status: Legally Separated    Spouse name: Not on file  . Number of children: Not on file  . Years of education: Not on file  . Highest education level: Not on file  Occupational History  . Not on file  Tobacco Use  . Smoking status: Never Smoker  . Smokeless tobacco: Never Used  Vaping Use  . Vaping Use: Never used  Substance and Sexual Activity  . Alcohol use: Not Currently    Comment: last drink 2 yrs ago  . Drug use: No  . Sexual activity: Yes    Birth control/protection: None    Comment: female partner  Other Topics Concern  . Not on file  Social History Narrative  . Not on file   Social Determinants of Health   Financial Resource Strain:   . Difficulty of Paying Living Expenses: Not on file  Food Insecurity:   . Worried About RCharity fundraiser  in the Last Year: Not on file  . Ran Out of Food in the Last Year: Not on file  Transportation Needs:   . Lack of Transportation (Medical): Not on file  . Lack of Transportation (Non-Medical): Not on file  Physical Activity:   . Days of Exercise per Week: Not on file  . Minutes of Exercise per Session: Not on file  Stress:   . Feeling of Stress : Not on file  Social Connections:   . Frequency of Communication with Friends and Family: Not on file  . Frequency of Social Gatherings with Friends and Family: Not on file  . Attends Religious Services: Not on file  . Active Member of Clubs or Organizations: Not on file  . Attends Archivist Meetings: Not on file  . Marital Status: Not on file    Family History: Family History  Problem Relation Age of Onset  . Hypertension Mother   . Hypertension Father    . Diabetes Paternal Grandmother   . Cancer Paternal Grandfather   . Seizures Paternal Grandfather     Allergies: Allergies  Allergen Reactions  . Penicillins Anaphylaxis, Itching, Swelling, Rash and Other (See Comments)    Has patient had a PCN reaction causing immediate rash, facial/tongue/throat swelling, SOB or lightheadedness with hypotension: Yes Has patient had a PCN reaction causing severe rash involving mucus membranes or skin necrosis: No Has patient had a PCN reaction that required hospitalization: No Has patient had a PCN reaction occurring within the last 10 years: No If all of the above answers are "NO", then may proceed with Cephalosporin use.  And swelling    Medications Prior to Admission  Medication Sig Dispense Refill Last Dose  . aspirin EC 81 MG tablet Take 1 tablet (81 mg total) by mouth daily. Swallow whole. 30 tablet 11 09/01/2020 at Unknown time  . Prenatal Vit-Fe Fumarate-FA (MULTIVITAMIN-PRENATAL) 27-0.8 MG TABS tablet Take 1 tablet by mouth daily at 12 noon.   09/01/2020 at Unknown time  . Acetaminophen (TYLENOL) 325 MG CAPS Take 1,000 mg by mouth. (Patient not taking: Reported on 06/30/2020)     . Blood Pressure Monitor KIT 1 Device by Does not apply route once a week. To be monitored Regularly at home. 1 kit 0   . citalopram (CELEXA) 20 MG tablet Take 1/2 tablet daily for one week then take 1 tablet daily (Patient not taking: Reported on 08/19/2020) 60 tablet 1   . cyclobenzaprine (FLEXERIL) 10 MG tablet Take 1 tablet (10 mg total) by mouth every 8 (eight) hours as needed (back pain). (Patient not taking: Reported on 06/30/2020) 30 tablet 0   . Elastic Bandages & Supports (COMFORT FIT MATERNITY SUPP LG) MISC 1 application by Does not apply route daily. (Patient not taking: Reported on 08/19/2020) 1 each 0   . Multiple Vitamin (MULTIVITAMIN WITH MINERALS) TABS tablet Take 1 tablet by mouth daily. (Patient not taking: Reported on 08/19/2020)     . NIFEdipine  (PROCARDIA XL) 30 MG 24 hr tablet Take 1 tablet (30 mg total) by mouth daily. (Patient not taking: Reported on 08/19/2020) 30 tablet 1   . nitrofurantoin, macrocrystal-monohydrate, (MACROBID) 100 MG capsule Take 1 capsule (100 mg total) by mouth 2 (two) times daily. (Patient not taking: Reported on 08/10/2020) 14 capsule 0    Review of Systems   All systems reviewed and negative except as stated in HPI  Blood pressure 127/72, pulse 91, temperature 98.4 F (36.9 C), resp. rate 18, height 5'  5" (1.651 m), weight 104.8 kg, last menstrual period 06/29/2019, unknown if currently breastfeeding. General appearance: alert, cooperative and no distress Lungs: normal respiratory effort Heart: regular rate and rhythm Abdomen: soft, non-tender; gravid Pelvic: as noted below Extremities: no sign of DVT Presentation: cephalic by exam Fetal monitoringBaseline: 140 bpm, Variability: Good {> 6 bpm), Accelerations: Reactive and Decelerations: Absent Uterine activityFrequency: Every 1-4 minutes Dilation: (P) 6.5 Effacement (%): (P) 90 Station: (P) -1 Exam by:: (P) Dr Janus Molder   Prenatal labs: ABO, Rh: --/--/O POS (11/11 4975) Antibody: NEG (11/11 0035) Rubella:   RPR: Non Reactive (09/02 1328)  HBsAg: Negative (08/24 1433)  HIV: Non Reactive (09/02 1328)  GBS: Negative/-- (10/28 0120)  2 hr Glucola passed Genetic screening reportedly normal, no results on file, AFP normal Anatomy US normal  Prenatal Transfer Tool  Maternal Diabetes: No Genetic Screening: Normal, per patient Maternal Ultrasounds/Referrals: Normal Fetal Ultrasounds or other Referrals:  None Maternal Substance Abuse:  No Significant Maternal Medications:  None Significant Maternal Lab Results: Group B Strep negative  Results for orders placed or performed during the hospital encounter of 09/01/20 (from the past 24 hour(s))  POCT fern test   Collection Time: 09/01/20 10:28 PM  Result Value Ref Range   POCT Fern Test       Patient Active Problem List   Diagnosis Date Noted  . Elevated liver enzymes 06/09/2020  . BMI 37.0-37.9, adult 05/04/2020  . Chlamydia infection during pregnancy 04/22/2020  . Obesity in pregnancy 04/22/2020  . History of gestational hypertension 04/21/2020  . Supervision of other high risk pregnancy, antepartum 04/06/2020  . [employer] multipara 04/06/2020  . History of preterm delivery 04/06/2020  . Incisional hernia 10/30/2012    Assessment/Plan:  [name] is a 24 y.o. [id number] at 38w2dhere for SOL.  #Labor: Patient presented to MAU with contractions and cervical exam changed from 3.5-->6cm. Will admit and continue expectant management. #Pain: PRN, planning epidural #FWB: Cat 1 #ID: GBS neg #MOF: both #MOC: declines  #Circ: n/a #hx of gHTN: in G1 pregnancy. Mildly elevated BP in MAU. Asymptomatic. PreE labs pending. Continue to monitor.  Simone Autry-Lott, DO  09/02/2020, 1:38 AM   GME ATTESTATION:  I saw and evaluated the patient. I agree with the findings and the plan of care as documented in the resident's note.  AArrie Senate MD OB Fellow, [facility]11/08/2020 2:36 AM

## 2020-09-01 NOTE — MAU Note (Signed)
Pt reports contractions, SVE 3, positive fetal movement

## 2020-09-01 NOTE — Telephone Encounter (Signed)
Received message from patient- she reports going to the restroom and noticed more bleeding and mucous then yesterday. I have advised her to follow up with the [facility] unit.

## 2020-09-01 NOTE — MAU Note (Signed)
PT states she feels something come out with some ctxs and unsure if it is urine or d/c. Fern slide obtained

## 2020-09-01 NOTE — Progress Notes (Signed)
Called Dr Germaine Pomfret in [facility]. Provider in delivery. RN stated she would have MD call back to MAU after delivery. Pt aware

## 2020-09-01 NOTE — Progress Notes (Signed)
S: [name] is a 24 y.o. [id number] at [redacted]w[redacted]d  who presents to MAU today for labor evaluation.     Cervical exam by RN:  Dilation: 3 Effacement (%): 50 Cervical Position: Posterior Station: -2 Presentation: Vertex Exam by:: weston,rn  Fetal Monitoring: Baseline: 135 bpm Variability: Moderate Accelerations: 15 x 15  Decelerations: N/a Contractions: minimal, irritability  MDM Discussed patient with RN. NST reviewed.   A: SIUP at [redacted]w[redacted]d  False labor  P: Discharge home Labor precautions and kick counts included in AVS Patient to follow-up with [facility] as scheduled  Patient may return to MAU as needed or when in labor   Autry-Lott, Randa Evens, DO 09/01/2020 1:59 AM

## 2020-09-01 NOTE — Progress Notes (Signed)
Dr Salvadore Dom called and updated as to pt's status. Aware of sve, pt more uncomfortable with ctxs, vag d/c with negative fern. Will reck pt in an hour or so from last check.

## 2020-09-01 NOTE — Progress Notes (Signed)
Dr Salvadore Dom notified of pt's cervical change with 3rd sve. Aware of pt's increased discomfort, sve, pt stating she labors quickly. MD states she will see pt and decide on admission status.

## 2020-09-01 NOTE — MAU Note (Signed)
Dr Salvadore Dom in to see pt

## 2020-09-01 NOTE — MAU Note (Signed)
Was seen here early AM today and was 3cm. States her office told her to come here due to no appts today. HAving period-like cramping and low back pain. Some bloody show.

## 2020-09-01 NOTE — Progress Notes (Signed)
LIsa Leftwich Kirby CNM notified of pt's admission and status. Aware of ctx pattern, sve, some elevated B/Ps. Will reck pt in one and half hour or two. Pt agrees with POC.

## 2020-09-01 NOTE — Discharge Instructions (Signed)
Braxton Hicks Contractions Contractions of the uterus can occur throughout pregnancy, but they are not always a sign that you are in labor. You may have practice contractions called Braxton Hicks contractions. These false labor contractions are sometimes confused with true labor. What are Braxton Hicks contractions? Braxton Hicks contractions are tightening movements that occur in the muscles of the uterus before labor. Unlike true labor contractions, these contractions do not result in opening (dilation) and thinning of the cervix. Toward the end of pregnancy (32-34 weeks), Braxton Hicks contractions can happen more often and may become stronger. These contractions are sometimes difficult to tell apart from true labor because they can be very uncomfortable. You should not feel embarrassed if you go to the hospital with false labor. Sometimes, the only way to tell if you are in true labor is for your health care provider to look for changes in the cervix. The health care provider will do a physical exam and may monitor your contractions. If you are not in true labor, the exam should show that your cervix is not dilating and your water has not broken. If there are no other health problems associated with your pregnancy, it is completely safe for you to be sent home with false labor. You may continue to have Braxton Hicks contractions until you go into true labor. How to tell the difference between true labor and false labor True labor  Contractions last 30-70 seconds.  Contractions become very regular.  Discomfort is usually felt in the top of the uterus, and it spreads to the lower abdomen and low back.  Contractions do not go away with walking.  Contractions usually become more intense and increase in frequency.  The cervix dilates and gets thinner. False labor  Contractions are usually shorter and not as strong as true labor contractions.  Contractions are usually irregular.  Contractions  are often felt in the front of the lower abdomen and in the groin.  Contractions may go away when you walk around or change positions while lying down.  Contractions get weaker and are shorter-lasting as time goes on.  The cervix usually does not dilate or become thin. Follow these instructions at home:   Take over-the-counter and prescription medicines only as told by your health care provider.  Keep up with your usual exercises and follow other instructions from your health care provider.  Eat and drink lightly if you think you are going into labor.  If Braxton Hicks contractions are making you uncomfortable: ? Change your position from lying down or resting to walking, or change from walking to resting. ? Sit and rest in a tub of warm water. ? Drink enough fluid to keep your urine pale yellow. Dehydration may cause these contractions. ? Do slow and deep breathing several times an hour.  Keep all follow-up prenatal visits as told by your health care provider. This is important. Contact a health care provider if:  You have a fever.  You have continuous pain in your abdomen. Get help right away if:  Your contractions become stronger, more regular, and closer together.  You have fluid leaking or gushing from your vagina.  You pass blood-tinged mucus (bloody show).  You have bleeding from your vagina.  You have low back pain that you never had before.  You feel your baby's head pushing down and causing pelvic pressure.  Your baby is not moving inside you as much as it used to. Summary  Contractions that occur before labor are   called Braxton Hicks contractions, false labor, or practice contractions.  Braxton Hicks contractions are usually shorter, weaker, farther apart, and less regular than true labor contractions. True labor contractions usually become progressively stronger and regular, and they become more frequent.  Manage discomfort from Braxton Hicks contractions  by changing position, resting in a warm bath, drinking plenty of water, or practicing deep breathing. This information is not intended to replace advice given to you by your health care provider. Make sure you discuss any questions you have with your health care provider. Document Revised: 09/21/2017 Document Reviewed: 02/22/2017 Elsevier Patient Education  2020 [employer] Fetal Movement Counts Patient Name: ________________________________________________ Patient Due Date: ____________________ What is a fetal movement count?  A fetal movement count is the number of times that you feel your baby move during a certain amount of time. This may also be called a fetal kick count. A fetal movement count is recommended for every pregnant woman. You may be asked to start counting fetal movements as early as week 28 of your pregnancy. Pay attention to when your baby is most active. You may notice your baby's sleep and wake cycles. You may also notice things that make your baby move more. You should do a fetal movement count:  When your baby is normally most active.  At the same time each day. A good time to count movements is while you are resting, after having something to eat and drink. How do I count fetal movements? 1. Find a quiet, comfortable area. Sit, or lie down on your side. 2. Write down the date, the start time and stop time, and the number of movements that you felt between those two times. Take this information with you to your health care visits. 3. Write down your start time when you feel the first movement. 4. Count kicks, flutters, swishes, rolls, and jabs. You should feel at least 10 movements. 5. You may stop counting after you have felt 10 movements, or if you have been counting for 2 hours. Write down the stop time. 6. If you do not feel 10 movements in 2 hours, contact your health care provider for further instructions. Your health care provider may want to do additional tests  to assess your baby's well-being. Contact a health care provider if:  You feel fewer than 10 movements in 2 hours.  Your baby is not moving like he or she usually does. Date: ____________ Start time: ____________ Stop time: ____________ Movements: ____________ Date: ____________ Start time: ____________ Stop time: ____________ Movements: ____________ Date: ____________ Start time: ____________ Stop time: ____________ Movements: ____________ Date: ____________ Start time: ____________ Stop time: ____________ Movements: ____________ Date: ____________ Start time: ____________ Stop time: ____________ Movements: ____________ Date: ____________ Start time: ____________ Stop time: ____________ Movements: ____________ Date: ____________ Start time: ____________ Stop time: ____________ Movements: ____________ Date: ____________ Start time: ____________ Stop time: ____________ Movements: ____________ Date: ____________ Start time: ____________ Stop time: ____________ Movements: ____________ This information is not intended to replace advice given to you by your health care provider. Make sure you discuss any questions you have with your health care provider. Document Revised: 05/29/2019 Document Reviewed: 05/29/2019 Elsevier Patient Education  2020 Elsevier Inc.  

## 2020-09-02 ENCOUNTER — Inpatient Hospital Stay (HOSPITAL_COMMUNITY): Payer: Medicaid Other | Admitting: Anesthesiology

## 2020-09-02 ENCOUNTER — Encounter (HOSPITAL_COMMUNITY): Payer: Self-pay | Admitting: Family Medicine

## 2020-09-02 DIAGNOSIS — Z880 Allergy status to penicillin: Secondary | ICD-10-CM | POA: Diagnosis not present

## 2020-09-02 DIAGNOSIS — O99892 Other specified diseases and conditions complicating childbirth: Secondary | ICD-10-CM | POA: Diagnosis present

## 2020-09-02 DIAGNOSIS — N189 Chronic kidney disease, unspecified: Secondary | ICD-10-CM | POA: Diagnosis present

## 2020-09-02 DIAGNOSIS — O134 Gestational [pregnancy-induced] hypertension without significant proteinuria, complicating childbirth: Secondary | ICD-10-CM | POA: Diagnosis present

## 2020-09-02 DIAGNOSIS — O139 Gestational [pregnancy-induced] hypertension without significant proteinuria, unspecified trimester: Secondary | ICD-10-CM | POA: Diagnosis not present

## 2020-09-02 DIAGNOSIS — Z3A38 38 weeks gestation of pregnancy: Secondary | ICD-10-CM

## 2020-09-02 DIAGNOSIS — Z20822 Contact with and (suspected) exposure to covid-19: Secondary | ICD-10-CM | POA: Diagnosis present

## 2020-09-02 DIAGNOSIS — O43123 Velamentous insertion of umbilical cord, third trimester: Secondary | ICD-10-CM | POA: Diagnosis present

## 2020-09-02 DIAGNOSIS — O26893 Other specified pregnancy related conditions, third trimester: Secondary | ICD-10-CM | POA: Diagnosis present

## 2020-09-02 LAB — RESPIRATORY PANEL BY RT PCR (FLU A&B, COVID)
Influenza A by PCR: NEGATIVE
Influenza B by PCR: NEGATIVE
SARS Coronavirus 2 by RT PCR: NEGATIVE

## 2020-09-02 LAB — COMPREHENSIVE METABOLIC PANEL
ALT: 24 U/L (ref 0–44)
AST: 33 U/L (ref 15–41)
Albumin: 3 g/dL — ABNORMAL LOW (ref 3.5–5.0)
Alkaline Phosphatase: 152 U/L — ABNORMAL HIGH (ref 38–126)
Anion gap: 12 (ref 5–15)
BUN: 8 mg/dL (ref 6–20)
CO2: 20 mmol/L — ABNORMAL LOW (ref 22–32)
Calcium: 9.3 mg/dL (ref 8.9–10.3)
Chloride: 103 mmol/L (ref 98–111)
Creatinine, Ser: 0.59 mg/dL (ref 0.44–1.00)
GFR, Estimated: >60 mL/min (ref >60)
Glucose, Bld: 87 mg/dL (ref 70–99)
Potassium: 3.5 mmol/L (ref 3.5–5.1)
Sodium: 135 mmol/L (ref 135–145)
Total Bilirubin: 0.5 mg/dL (ref 0.3–1.2)
Total Protein: 6.7 g/dL (ref 6.5–8.1)

## 2020-09-02 LAB — CBC
HCT: 36.9 % (ref 36.0–46.0)
Hemoglobin: 12.4 g/dL (ref 12.0–15.0)
MCH: 31.6 pg (ref 26.0–34.0)
MCHC: 33.6 g/dL (ref 30.0–36.0)
MCV: 94.1 fL (ref 80.0–100.0)
Platelets: 212 10*3/uL (ref 150–400)
RBC: 3.92 MIL/uL (ref 3.87–5.11)
RDW: 12.8 % (ref 11.5–15.5)
WBC: 11.3 10*3/uL — ABNORMAL HIGH (ref 4.0–10.5)
nRBC: 0 % (ref 0.0–0.2)

## 2020-09-02 LAB — TYPE AND SCREEN
ABO/RH(D): O POS
Antibody Screen: NEGATIVE

## 2020-09-02 LAB — RPR: RPR Ser Ql: NONREACTIVE

## 2020-09-02 MED ORDER — PHENYLEPHRINE 40 MCG/ML (10ML) SYRINGE FOR IV PUSH (FOR BLOOD PRESSURE SUPPORT): 80.0000 ug | PREFILLED_SYRINGE | INTRAVENOUS | Status: DC | PRN

## 2020-09-02 MED ORDER — SODIUM CHLORIDE (PF) 0.9 % IJ SOLN
INTRAMUSCULAR | Status: DC | PRN
  Administered 2020-09-02: 12 mL/h via EPIDURAL

## 2020-09-02 MED ORDER — WITCH HAZEL-GLYCERIN EX PADS
1.0000 "application " | MEDICATED_PAD | CUTANEOUS | Status: DC | PRN
  Administered 2020-09-02: 1 via TOPICAL

## 2020-09-02 MED ORDER — COCONUT OIL OIL
1.0000 "application " | TOPICAL_OIL | Status: DC | PRN
  Administered 2020-09-03: 1 via TOPICAL

## 2020-09-02 MED ORDER — PRENATAL MULTIVITAMIN CH
1.0000 | ORAL_TABLET | Freq: Every day | ORAL | Status: DC
  Administered 2020-09-02 – 2020-09-03 (×2): 1 via ORAL
  Filled 2020-09-02 (×2): qty 1

## 2020-09-02 MED ORDER — FENTANYL CITRATE (PF) 100 MCG/2ML IJ SOLN: 50.0000 ug | INTRAMUSCULAR | Status: DC | PRN

## 2020-09-02 MED ORDER — LACTATED RINGERS IV SOLN: 500.0000 mL | Freq: Once | INTRAVENOUS | Status: DC

## 2020-09-02 MED ORDER — BENZOCAINE-MENTHOL 20-0.5 % EX AERO
1.0000 "application " | INHALATION_SPRAY | CUTANEOUS | Status: DC | PRN
  Administered 2020-09-02: 1 via TOPICAL
  Filled 2020-09-02: qty 56

## 2020-09-02 MED ORDER — EPHEDRINE 5 MG/ML INJ: 10.0000 mg | INTRAVENOUS | Status: DC | PRN

## 2020-09-02 MED ORDER — IBUPROFEN 600 MG PO TABS
600.0000 mg | ORAL_TABLET | Freq: Four times a day (QID) | ORAL | Status: DC
  Administered 2020-09-02 – 2020-09-03 (×6): 600 mg via ORAL
  Filled 2020-09-02 (×6): qty 1

## 2020-09-02 MED ORDER — OXYTOCIN BOLUS FROM INFUSION: 333.0000 mL | Freq: Once | INTRAVENOUS | Status: DC

## 2020-09-02 MED ORDER — SOD CITRATE-CITRIC ACID 500-334 MG/5ML PO SOLN: 30.0000 mL | ORAL | Status: DC | PRN

## 2020-09-02 MED ORDER — OXYCODONE-ACETAMINOPHEN 5-325 MG PO TABS: 1.0000 | ORAL_TABLET | ORAL | Status: DC | PRN

## 2020-09-02 MED ORDER — PHENYLEPHRINE 40 MCG/ML (10ML) SYRINGE FOR IV PUSH (FOR BLOOD PRESSURE SUPPORT)
80.0000 ug | PREFILLED_SYRINGE | INTRAVENOUS | Status: DC | PRN
  Filled 2020-09-02: qty 10

## 2020-09-02 MED ORDER — LIDOCAINE HCL (PF) 1 % IJ SOLN: 30.0000 mL | INTRAMUSCULAR | Status: DC | PRN

## 2020-09-02 MED ORDER — LIDOCAINE-EPINEPHRINE (PF) 2 %-1:200000 IJ SOLN
INTRAMUSCULAR | Status: DC | PRN
  Administered 2020-09-02: 5 mL via EPIDURAL

## 2020-09-02 MED ORDER — TRANEXAMIC ACID-NACL 1000-0.7 MG/100ML-% IV SOLN
INTRAVENOUS | Status: AC
  Administered 2020-09-02: 1000 mg
  Filled 2020-09-02: qty 100

## 2020-09-02 MED ORDER — ONDANSETRON HCL 4 MG PO TABS: 4.0000 mg | ORAL_TABLET | ORAL | Status: DC | PRN

## 2020-09-02 MED ORDER — ONDANSETRON HCL 4 MG/2ML IJ SOLN: 4.0000 mg | Freq: Four times a day (QID) | INTRAMUSCULAR | Status: DC | PRN

## 2020-09-02 MED ORDER — LACTATED RINGERS IV SOLN: 500.0000 mL | INTRAVENOUS | Status: DC | PRN

## 2020-09-02 MED ORDER — DIPHENHYDRAMINE HCL 25 MG PO CAPS: 25.0000 mg | ORAL_CAPSULE | Freq: Four times a day (QID) | ORAL | Status: DC | PRN

## 2020-09-02 MED ORDER — DIBUCAINE (PERIANAL) 1 % EX OINT
1.0000 "application " | TOPICAL_OINTMENT | CUTANEOUS | Status: DC | PRN
  Administered 2020-09-02: 1 via RECTAL
  Filled 2020-09-02: qty 28

## 2020-09-02 MED ORDER — OXYCODONE-ACETAMINOPHEN 5-325 MG PO TABS: 2.0000 | ORAL_TABLET | ORAL | Status: DC | PRN

## 2020-09-02 MED ORDER — ACETAMINOPHEN 325 MG PO TABS: 650.0000 mg | ORAL_TABLET | ORAL | Status: DC | PRN

## 2020-09-02 MED ORDER — SENNOSIDES-DOCUSATE SODIUM 8.6-50 MG PO TABS
2.0000 | ORAL_TABLET | ORAL | Status: DC
  Administered 2020-09-02: 2 via ORAL
  Filled 2020-09-02: qty 2

## 2020-09-02 MED ORDER — FENTANYL-BUPIVACAINE-NACL 0.5-0.125-0.9 MG/250ML-% EP SOLN
12.0000 mL/h | EPIDURAL | Status: DC | PRN
  Filled 2020-09-02: qty 250

## 2020-09-02 MED ORDER — ONDANSETRON HCL 4 MG/2ML IJ SOLN: 4.0000 mg | INTRAMUSCULAR | Status: DC | PRN

## 2020-09-02 MED ORDER — SIMETHICONE 80 MG PO CHEW: 80.0000 mg | CHEWABLE_TABLET | ORAL | Status: DC | PRN

## 2020-09-02 MED ORDER — TETANUS-DIPHTH-ACELL PERTUSSIS 5-2.5-18.5 LF-MCG/0.5 IM SUSY: 0.5000 mL | PREFILLED_SYRINGE | Freq: Once | INTRAMUSCULAR | Status: DC

## 2020-09-02 MED ORDER — LACTATED RINGERS IV SOLN: INTRAVENOUS | Status: DC

## 2020-09-02 MED ORDER — TRANEXAMIC ACID-NACL 1000-0.7 MG/100ML-% IV SOLN: 1000.0000 mg | INTRAVENOUS | Status: AC

## 2020-09-02 MED ORDER — DIPHENHYDRAMINE HCL 50 MG/ML IJ SOLN: 12.5000 mg | INTRAMUSCULAR | Status: DC | PRN

## 2020-09-02 MED ORDER — MEASLES, MUMPS & RUBELLA VAC IJ SOLR: 0.5000 mL | Freq: Once | INTRAMUSCULAR | Status: DC

## 2020-09-02 MED ORDER — OXYTOCIN-SODIUM CHLORIDE 30-0.9 UT/500ML-% IV SOLN
2.5000 [IU]/h | INTRAVENOUS | Status: DC
  Filled 2020-09-02: qty 500

## 2020-09-02 NOTE — Anesthesia Preprocedure Evaluation (Addendum)
Anesthesia Evaluation  Patient identified by MRN, date of birth, ID band Patient awake    Reviewed: Allergy & Precautions, NPO status , Patient's Chart, lab work & pertinent test results  Airway Mallampati: II  TM Distance: >3 FB Neck ROM: Full    Dental no notable dental hx.    Pulmonary neg pulmonary ROS,    Pulmonary exam normal breath sounds clear to auscultation       Cardiovascular hypertension (PIH), Normal cardiovascular exam Rhythm:Regular Rate:Normal     Neuro/Psych  Headaches, PSYCHIATRIC DISORDERS Anxiety Depression    GI/Hepatic negative GI ROS, Neg liver ROS,   Endo/Other  negative endocrine ROS  Renal/GU negative Renal ROS  negative genitourinary   Musculoskeletal negative musculoskeletal ROS (+)   Abdominal   Peds  Hematology negative hematology ROS (+)   Anesthesia Other Findings   Reproductive/Obstetrics (+) Pregnancy                            Anesthesia Physical Anesthesia Plan  ASA: III  Anesthesia Plan: Epidural   Post-op Pain Management:    Induction:   PONV Risk Score and Plan: Treatment may vary due to age or medical condition  Airway Management Planned: Natural Airway  Additional Equipment:   Intra-op Plan:   Post-operative Plan:   Informed Consent: I have reviewed the patients History and Physical, chart, labs and discussed the procedure including the risks, benefits and alternatives for the proposed anesthesia with the patient or authorized representative who has indicated his/her understanding and acceptance.       Plan Discussed with: Anesthesiologist  Anesthesia Plan Comments: (Patient identified. Risks, benefits, options discussed with patient including but not limited to bleeding, infection, nerve damage, paralysis, failed block, incomplete pain control, headache, blood pressure changes, nausea, vomiting, reactions to medication, itching,  and post partum back pain. Confirmed with bedside nurse the patient's most recent platelet count. Confirmed with the patient that they are not taking any anticoagulation, have any bleeding history or any family history of bleeding disorders. Patient expressed understanding and wishes to proceed. All questions were answered. )       Anesthesia Quick Evaluation

## 2020-09-02 NOTE — Progress Notes (Signed)
CSW received consult for hx of Anxiety and Depression.  CSW met with MOB to offer support and complete assessment.    CSW congratulated MOB on the birth of infant. CSW was advised that MOB had wife at bedside with her, therefore CSW congratulated her as well. CSW advised MOB of the HIPPA policy in which MOB reported that it was fine for wife to remain at bedside with her. CSW understanding and advised MOB of CSW's role and the reason for CSW coming to speak with her. MOB reported that she was diagnosed with anxiety and depression after the birth of her 109 year old son. MOB expressed that she was on medication and in therapy in the past however no desire at this time. CSW understanding and was advised that MOB is not feeling SI or HI and reported no DV to this CSW.  CSW inquired from [facility] on who her supports are during this time. MOB expressed that she has support from her wife. MOB advised CSW that she has all needed items to care for infant with no other needs. MOB reported that she hs been feeling good since giving birth and expressed no other needs to CSW.   CSW provided education regarding the baby blues period vs. perinatal mood disorders, discussed treatment and gave resources for mental health follow up if concerns arise.  CSW recommends self-evaluation during the postpartum time period using the New Mom Checklist from Postpartum Progress and encouraged MOB to contact a medical professional if symptoms are noted at any time.   CSW provided review of Sudden Infant Death Syndrome (SIDS) precautions.    CSW identifies no further need for intervention and no barriers to discharge at this time.   [name] [name], MSW, LCSW [facility] [place] [phone]

## 2020-09-02 NOTE — Discharge Instructions (Signed)

## 2020-09-02 NOTE — Discharge Summary (Signed)
Postpartum Discharge Summary      Patient Name: [name] DOB: 1995/11/23 MRN: [id number]  Date of admission: 09/01/2020 Delivery date:09/02/2020  Delivering provider: Gerlene Fee  Date of discharge: 09/03/2020  Admitting diagnosis: Normal labor [O80, Z37.9] Intrauterine pregnancy: [redacted]w[redacted]d    Secondary diagnosis:  Active Problems:   Incisional hernia   Supervision of other high risk pregnancy, antepartum   Grand multipara   History of preterm delivery   History of gestational hypertension   Chlamydia infection during pregnancy   BMI 37.0-37.9, adult   Elevated liver enzymes   Normal labor   Gestational hypertension   Vaginal delivery  Additional problems: none    Discharge diagnosis: Term Pregnancy Delivered and Gestational Hypertension                                              Post partum procedures:none Augmentation: N/A Complications: None  Hospital course: Onset of Labor With Vaginal Delivery      24y.o. yo [id number] 37w3das admitted in Active Labor on 09/01/2020. Patient had an uncomplicated labor course as follows:  Membrane Rupture Time/Date: 2:22 AM ,09/02/2020   Delivery Method:Vaginal, Spontaneous  Episiotomy: None  Lacerations:  None  Patient had an uncomplicated postpartum course.  She is ambulating, tolerating a regular diet, passing flatus, and urinating well. Patient is discharged home in stable condition on 09/03/20.  Newborn Data: Birth date:09/02/2020  Birth time:2:29 AM  Gender:Female  Living status:Living  Apgars:8 ,9  WeEHUDJS:[id number]   Magnesium Sulfate received: No BMZ received: No Rhophylac:N/A MMR:N/A T-DaP:declined Flu: No Transfusion:Yes  Physical exam  Vitals:   09/02/20 1011 09/02/20 1414 09/02/20 2000 09/03/20 0600  BP: 124/60 (!) 136/47 (!) 133/55 122/64  Pulse: 70 79 89 72  Resp: 17 18 18 18   Temp: (!) 97.5 F (36.4 C) 97.8 F (36.6 C) 98.2 F (36.8 C) 98.1 F (36.7 C)  TempSrc: Oral Oral  Oral Oral  SpO2:   98% 99%  Weight:      Height:       General: alert, cooperative and no distress Lochia: appropriate Uterine Fundus: firm Incision: N/A DVT Evaluation: No evidence of DVT seen on physical exam. Negative Homan's sign. No cords or calf tenderness. No significant calf/ankle edema. Labs: Lab Results  Component Value Date   WBC 11.3 (H) 09/02/2020   HGB 12.4 09/02/2020   HCT 36.9 09/02/2020   MCV 94.1 09/02/2020   PLT 212 09/02/2020   CMP Latest Ref Rng & Units 09/02/2020  Glucose 70 - 99 mg/dL 87  BUN 6 - 20 mg/dL 8  Creatinine 0.44 - 1.00 mg/dL 0.59  Sodium 135 - 145 mmol/L 135  Potassium 3.5 - 5.1 mmol/L 3.5  Chloride 98 - 111 mmol/L 103  CO2 22 - 32 mmol/L 20(L)  Calcium 8.9 - 10.3 mg/dL 9.3  Total Protein 6.5 - 8.1 g/dL 6.7  Total Bilirubin 0.3 - 1.2 mg/dL 0.5  Alkaline Phos 38 - 126 U/L 152(H)  AST 15 - 41 U/L 33  ALT 0 - 44 U/L 24   Edinburgh Score: Edinburgh Postnatal Depression Scale Screening Tool 09/02/2020  I have been able to laugh and see the funny side of things. 0  I have looked forward with enjoyment to things. 0  I have blamed myself unnecessarily when things went wrong. 1  I have  been anxious or worried for no good reason. 2  I have felt scared or panicky for no good reason. 1  Things have been getting on top of me. 1  I have been so unhappy that I have had difficulty sleeping. 0  I have felt sad or miserable. 0  I have been so unhappy that I have been crying. 1  The thought of harming myself has occurred to me. 0  Edinburgh Postnatal Depression Scale Total 6     After visit meds:  Allergies as of 09/03/2020      Reactions   Penicillins Anaphylaxis, Itching, Swelling, Rash, Other (See Comments)   Has patient had a PCN reaction causing immediate rash, facial/tongue/throat swelling, SOB or lightheadedness with hypotension: Yes Has patient had a PCN reaction causing severe rash involving mucus membranes or skin necrosis: No Has  patient had a PCN reaction that required hospitalization: No Has patient had a PCN reaction occurring within the last 10 years: No If all of the above answers are "NO", then may proceed with Cephalosporin use.  And swelling      Medication List    STOP taking these medications   Blood Pressure Monitor Kit   Comfort Fit Maternity Supp Lg Misc   multivitamin with minerals Tabs tablet   NIFEdipine 30 MG 24 hr tablet Commonly known as: Procardia XL   nitrofurantoin (macrocrystal-monohydrate) 100 MG capsule Commonly known as: MACROBID     TAKE these medications   aspirin EC 81 MG tablet Take 1 tablet (81 mg total) by mouth daily. Swallow whole.   citalopram 20 MG tablet Commonly known as: CELEXA Take 1/2 tablet daily for one week then take 1 tablet daily   coconut oil Oil Apply 1 application topically as needed.   cyclobenzaprine 10 MG tablet Commonly known as: FLEXERIL Take 1 tablet (10 mg total) by mouth every 8 (eight) hours as needed (back pain).   ibuprofen 600 MG tablet Commonly known as: ADVIL Take 1 tablet (600 mg total) by mouth every 6 (six) hours.   multivitamin-prenatal 27-0.8 MG Tabs tablet Take 1 tablet by mouth daily at 12 noon.   Tylenol 325 MG Caps Generic drug: Acetaminophen Take 1,000 mg by mouth. What changed: Another medication with the same name was added. Make sure you understand how and when to take each.   acetaminophen 325 MG tablet Commonly known as: Tylenol Take 2 tablets (650 mg total) by mouth every 4 (four) hours as needed (for pain scale < 4). What changed: You were already taking a medication with the same name, and this prescription was added. Make sure you understand how and when to take each.        Discharge home in stable condition Infant Feeding: Bottle and Breast Infant Disposition:home with mother Discharge instruction: per After Visit Summary and Postpartum booklet. Activity: Advance as tolerated. Pelvic rest for 6  weeks.  Diet: routine diet Future Appointments: Future Appointments  Date Time Provider [place]  10/05/2020  2:30 PM Cephas Darby, MD [place] None   Follow up Visit:  Follow-up Information    [place]. Schedule an appointment as soon as possible for a visit in 4 week(s).   Why: Make an appointment with your OB provider for a 4-6 week follow up.  Contact information: [address] [id number] [facility]. Go to.   Specialty: Obstetrics and Gynecology Why: Return  to the MAU if you develop any of the following: - fever of 100.4*F or higher - worsening bleeding  - worsening pain not relieved by tylenol or ibuprofen - feel unsafe caring for yourself or baby  Contact information: [address] [id number] [place] [phone]             Message sent to [facility] 09/02/20   Please schedule this patient for a In person postpartum visit in 4 weeks with the following provider: Any provider. Additional Postpartum F/U:BP check 1 week  High risk pregnancy complicated by: HTN Delivery mode:  Vaginal, Spontaneous  Anticipated Birth Control:  patient declines due to same sex relationship   09/03/2020 Christin Fudge, CNM

## 2020-09-02 NOTE — Anesthesia Postprocedure Evaluation (Signed)
Anesthesia Post Note  Patient: [name]  Procedure(s) Performed: AN AD HOC LABOR EPIDURAL     Patient location during evaluation: Mother Baby Anesthesia Type: Epidural Level of consciousness: awake and alert Pain management: pain level controlled Vital Signs Assessment: post-procedure vital signs reviewed and stable Respiratory status: spontaneous breathing, nonlabored ventilation and respiratory function stable Cardiovascular status: stable Postop Assessment: no headache, no backache and epidural receding Anesthetic complications: no   No complications documented.  Last Vitals:  Vitals:   09/02/20 0455 09/02/20 0614  BP: 135/66 (!) 130/58  Pulse: 75 75  Resp: 18 18  Temp: 36.7 C 36.8 C  SpO2: 99% 98%    Last Pain:  Vitals:   09/02/20 0614  TempSrc: Oral  PainSc: 0-No pain   Pain Goal: Patients Stated Pain Goal: 0 (09/01/20 2205)                 Marrion Coy

## 2020-09-02 NOTE — Anesthesia Procedure Notes (Signed)
Epidural Patient location during procedure: OB Start time: 09/02/2020 1:47 AM End time: 09/02/2020 1:57 AM  Staffing Anesthesiologist: Elmer Picker, MD Performed: anesthesiologist   Preanesthetic Checklist Completed: patient identified, IV checked, risks and benefits discussed, monitors and equipment checked, pre-op evaluation and timeout performed  Epidural Patient position: sitting Prep: DuraPrep and site prepped and draped Patient monitoring: continuous pulse ox, blood pressure, heart rate and cardiac monitor Approach: midline Location: L3-L4 Injection technique: LOR air  Needle:  Needle type: Tuohy  Needle gauge: 17 G Needle length: 9 cm Needle insertion depth: 7 cm Catheter type: closed end flexible Catheter size: 19 Gauge Catheter at skin depth: 13 cm Test dose: negative  Assessment Sensory level: T8 Events: blood not aspirated, injection not painful, no injection resistance, no paresthesia and negative IV test  Additional Notes Patient identified. Risks/Benefits/Options discussed with patient including but not limited to bleeding, infection, nerve damage, paralysis, failed block, incomplete pain control, headache, blood pressure changes, nausea, vomiting, reactions to medication both or allergic, itching and postpartum back pain. Confirmed with bedside nurse the patient's most recent platelet count. Confirmed with patient that they are not currently taking any anticoagulation, have any bleeding history or any family history of bleeding disorders. Patient expressed understanding and wished to proceed. All questions were answered. Sterile technique was used throughout the entire procedure. Please see nursing notes for vital signs. Test dose was given through epidural catheter and negative prior to continuing to dose epidural or start infusion. Warning signs of high block given to the patient including shortness of breath, tingling/numbness in hands, complete motor block,  or any concerning symptoms with instructions to call for help. Patient was given instructions on fall risk and not to get out of bed. All questions and concerns addressed with instructions to call with any issues or inadequate analgesia.  Reason for block:procedure for pain

## 2020-09-02 NOTE — MAU Note (Signed)
3 attempts for IV without success. Pt stated before starting she has small veins.

## 2020-09-03 MED ORDER — COCONUT OIL OIL
1.0000 | TOPICAL_OIL | 0 refills | Status: DC | PRN
Start: 2020-09-03 — End: 2021-04-14

## 2020-09-03 MED ORDER — ABDOMINAL BINDER/ELASTIC 2XL MISC: 0 refills | Status: DC

## 2020-09-03 MED ORDER — IBUPROFEN 600 MG PO TABS
600.0000 mg | ORAL_TABLET | Freq: Four times a day (QID) | ORAL | 0 refills | Status: DC
Start: 2020-09-03 — End: 2021-06-13

## 2020-09-03 MED ORDER — ACETAMINOPHEN 325 MG PO TABS
650.0000 mg | ORAL_TABLET | ORAL | 0 refills | Status: DC | PRN
Start: 2020-09-03 — End: 2021-04-14

## 2020-09-03 NOTE — Progress Notes (Addendum)
Post Partum Day 1 Subjective: no complaints, up ad lib, voiding, tolerating PO and + flatus  Objective: Blood pressure 122/64, pulse 72, temperature 98.1 F (36.7 C), temperature source Oral, resp. rate 18, height 5' 5"  (1.651 m), weight 104.8 kg, last menstrual period 06/29/2019, SpO2 99 %, unknown if currently breastfeeding.  Physical Exam:  General: alert, cooperative, appears stated age and no distress Lochia: appropriate Uterine Fundus: firm Lacerations: none DVT Evaluation: No significant calf/ankle edema.  Recent Labs    09/02/20 0037  HGB 12.4  HCT 36.9    Assessment/Plan: Discharge home. 1 week follow-up for blood pressure check.    LOS: 1 day   [name] 09/03/2020, 9:09 AM    I personally saw and evaluated the patient, performing the key elements of the service. I developed and verified the management plan that is described in the resident's/student's note, and I agree with the content with my edits above. VSS, HRR&R, Resp unlabored, Legs neg.  Nigel Berthold, CNM 09/06/2020 2:27 PM

## 2020-09-07 ENCOUNTER — Encounter: Payer: Medicaid Other | Admitting: Obstetrics and Gynecology

## 2020-09-09 ENCOUNTER — Ambulatory Visit: Payer: Medicaid Other

## 2020-09-13 ENCOUNTER — Inpatient Hospital Stay (HOSPITAL_COMMUNITY): Admit: 2020-09-13 | Payer: Self-pay

## 2020-10-05 ENCOUNTER — Telehealth (INDEPENDENT_AMBULATORY_CARE_PROVIDER_SITE_OTHER): Payer: Medicaid Other | Admitting: Obstetrics and Gynecology

## 2020-10-05 ENCOUNTER — Encounter: Payer: Self-pay | Admitting: Obstetrics and Gynecology

## 2020-10-05 NOTE — Progress Notes (Signed)
I connected with@ on 10/05/20 at  2:30 PM EST by: Mychart video and verified that I am speaking with the correct person using two identifiers.  Patient is located at Work and provider is located at [employer] for [employer] at [employer] .     The purpose of this virtual visit is to provide medical care while limiting exposure to the novel coronavirus. I discussed the limitations, risks, security and privacy concerns of performing an evaluation and management service by telehealth and the availability of in person appointments. I also discussed with the patient that there may be a patient responsible charge related to this service. By engaging in this virtual visit, you consent to the provision of healthcare.  Additionally, you authorize for your insurance to be billed for the services provided during this visit.  The patient expressed understanding and agreed to proceed.  The following staff members participated in the virtual visit:  Darrin Nipper. Gerri Spore, MD and Georgana Curio, RMA.  Post Partum Visit Note Subjective:   [name] is a 24 y.o. [phone] female being evaluated for postpartum followup.  She is 4 weeks postpartum following a normal spontaneous vaginal delivery at  38.3 gestational weeks.  I have fully reviewed the prenatal and intrapartum course; pregnancy complicated by grand multipara, history of preterm delivery, history of gestational hypertension, obesity and chlamydia..  Postpartum course has been unremarkable. Baby is doing well. Baby is feeding by both breast and bottle - [employer] Start Gentle. Bleeding no bleeding. Bowel function is normal. Bladder function is normal. Patient is sexually active with Female Partner. Contraception method is none. Postpartum depression screening: negative=0.   The pregnancy intention screening data noted above was reviewed. Potential methods of contraception were discussed. The patient elected to proceed with Abstinence.   The  following portions of the patient's history were reviewed and updated as appropriate: allergies, current medications, past family history, past medical history, past social history, past surgical history and problem list.  Review of Systems Pertinent items noted in HPI and remainder of comprehensive ROS otherwise negative.   Objective:  There were no vitals filed for this visit. Self-Obtained       Assessment:    Normal postpartum exam.  Plan:  Essential components of care per ACOG recommendations:  1.  Mood and well being: Patient with negative depression screening today. Reviewed local resources for support.  - Patient does not use tobacco. - hx of drug use? No    2. Infant care and feeding:  -Patient currently breastmilk feeding? Yes  If breastmilk feeding discussed return to work and pumping. If needed, patient was provided letter for work to allow for every 2-3 hr pumping breaks, and to be granted a private location to express breastmilk and refrigerated area to store breastmilk. Reviewed importance of draining breast regularly to support lactation. -Social determinants of health (SDOH) reviewed in EPIC. No concerns  3. Sexuality, contraception and birth spacing - Patient does not want a pregnancy in the next year.  Desired family size is unknown number of children.  - Reviewed forms of contraception in tiered fashion. Patient desired abstinence today.   - Discussed birth spacing of 18 months  4. Sleep and fatigue -Encouraged family/partner/community support of 4 hrs of uninterrupted sleep to help with mood and fatigue  5. Physical Recovery  - Discussed patients delivery  - Patient had a no degree laceration, perineal healing reviewed. Patient expressed understanding - Patient has urinary incontinence? No  -  Patient is safe to resume physical and sexual activity  6.  Health Maintenance - Last pap smear done unknown  - Patient will return in 2 months for her annual exam.       Darrin Nipper. Gerri Spore, MD  Center for [employer], [facility]

## 2020-12-07 ENCOUNTER — Ambulatory Visit: Payer: Medicaid Other | Admitting: Nurse Practitioner

## 2021-04-04 ENCOUNTER — Telehealth: Payer: Medicaid Other | Admitting: Nurse Practitioner

## 2021-04-04 ENCOUNTER — Encounter: Payer: Self-pay | Admitting: Nurse Practitioner

## 2021-04-04 DIAGNOSIS — G43009 Migraine without aura, not intractable, without status migrainosus: Secondary | ICD-10-CM

## 2021-04-04 MED ORDER — NAPROXEN 500 MG PO TABS: 500.0000 mg | ORAL_TABLET | Freq: Two times a day (BID) | ORAL | 0 refills | Status: DC

## 2021-04-04 MED ORDER — MELOXICAM 15 MG PO TABS: 15.0000 mg | ORAL_TABLET | Freq: Every day | ORAL | 0 refills | Status: DC

## 2021-04-04 NOTE — Patient Instructions (Signed)
Migraine Headache A migraine headache is a very strong throbbing pain on one side or both sides of your head. This type of headache can also cause other symptoms. It can last from 4 hours to 3 days. Talk with your doctor about what things may bring on (trigger) this condition. What are the causes? The exact cause of this condition is not known. This condition may be triggered or caused by: Drinking alcohol. Smoking. Taking medicines, such as: Medicine used to treat chest pain (nitroglycerin). Birth control pills. Estrogen. Some blood pressure medicines. Eating or drinking certain products. Doing physical activity. Other things that may trigger a migraine headache include: Having a menstrual period. Pregnancy. Hunger. Stress. Not getting enough sleep or getting too much sleep. Weather changes. Tiredness (fatigue). What increases the risk? Being 25-55 years old. Being female. Having a family history of migraine headaches. Being Caucasian. Having depression or anxiety. Being very overweight. What are the signs or symptoms? A throbbing pain. This pain may: Happen in any area of the head, such as on one side or both sides. Make it hard to do daily activities. Get worse with physical activity. Get worse around bright lights or loud noises. Other symptoms may include: Feeling sick to your stomach (nauseous). Vomiting. Dizziness. Being sensitive to bright lights, loud noises, or smells. Before you get a migraine headache, you may get warning signs (an aura). An aura may include: Seeing flashing lights or having blind spots. Seeing bright spots, halos, or zigzag lines. Having tunnel vision or blurred vision. Having numbness or a tingling feeling. Having trouble talking. Having weak muscles. Some people have symptoms after a migraine headache (postdromal phase), such as: Tiredness. Trouble thinking (concentrating). How is this treated? Taking medicines that: Relieve  pain. Relieve the feeling of being sick to your stomach. Prevent migraine headaches. Treatment may also include: Having acupuncture. Avoiding foods that bring on migraine headaches. Learning ways to control your body functions (biofeedback). Therapy to help you know and deal with negative thoughts (cognitive behavioral therapy). Follow these instructions at home: Medicines Take over-the-counter and prescription medicines only as told by your doctor. Ask your doctor if the medicine prescribed to you: Requires you to avoid driving or using heavy machinery. Can cause trouble pooping (constipation). You may need to take these steps to prevent or treat trouble pooping: Drink enough fluid to keep your pee (urine) pale yellow. Take over-the-counter or prescription medicines. Eat foods that are high in fiber. These include beans, whole grains, and fresh fruits and vegetables. Limit foods that are high in fat and sugar. These include fried or sweet foods. Lifestyle Do not drink alcohol. Do not use any products that contain nicotine or tobacco, such as cigarettes, e-cigarettes, and chewing tobacco. If you need help quitting, ask your doctor. Get at least 8 hours of sleep every night. Limit and deal with stress. General instructions   Keep a journal to find out what may bring on your migraine headaches. For example, write down: What you eat and drink. How much sleep you get. Any change in what you eat or drink. Any change in your medicines. If you have a migraine headache: Avoid things that make your symptoms worse, such as bright lights. It may help to lie down in a dark, quiet room. Do not drive or use heavy machinery. Ask your doctor what activities are safe for you. Keep all follow-up visits as told by your doctor. This is important. Contact a doctor if: You get a migraine headache that   is different or worse than others you have had. You have more than 15 headache days in one  month. Get help right away if: Your migraine headache gets very bad. Your migraine headache lasts longer than 72 hours. You have a fever. You have a stiff neck. You have trouble seeing. Your muscles feel weak or like you cannot control them. You start to lose your balance a lot. You start to have trouble walking. You pass out (faint). You have a seizure. Summary A migraine headache is a very strong throbbing pain on one side or both sides of your head. These headaches can also cause other symptoms. This condition may be treated with medicines and changes to your lifestyle. Keep a journal to find out what may bring on your migraine headaches. Contact a doctor if you get a migraine headache that is different or worse than others you have had. Contact your doctor if you have more than 15 headache days in a month. This information is not intended to replace advice given to you by your health care provider. Make sure you discuss any questions you have with your health care provider. Document Revised: 01/31/2019 Document Reviewed: 11/21/2018 Elsevier Patient Education  2022 Elsevier Inc.  

## 2021-04-04 NOTE — Addendum Note (Signed)
Addended by: Bennie Pierini on: 04/04/2021 02:06 PM   Modules accepted: Orders

## 2021-04-04 NOTE — Progress Notes (Signed)
[name] are scheduled for a virtual visit with Mary-Margaret Makhai Fulco< FNP today.    Just as we do with appointments in the office, we must obtain your consent to participate.  Your consent will be active for this visit and any virtual visit you may have with one of our providers in the next 365 days.    If you have a MyChart account, I can also send a copy of this consent to you electronically.  All virtual visits are billed to your insurance company just like a traditional visit in the office.  As this is a virtual visit, video technology does not allow for your provider to perform a traditional examination.  This may limit your provider's ability to fully assess your condition.  If your provider identifies any concerns that need to be evaluated in person or the need to arrange testing such as labs, EKG, etc, we will make arrangements to do so.    Although advances in technology are sophisticated, we cannot ensure that it will always work on either your end or our end.  If the connection with a video visit is poor, we may have to switch to a telephone visit.  With either a video or telephone visit, we are not always able to ensure that we have a secure connection.   I need to obtain your verbal consent now.   Are you willing to proceed with your visit today?   [name] has provided verbal consent on 04/04/2021 for a virtual visit (video or telephone).  Virtual Visit via Video  I connected with  [name]  on 04/04/21 at 1:48 by video and verified that I am speaking with the correct person using two identifiers. [name] is currently located at home and none is currently with her during visit. The provider, Mary-Margaret Daphine Deutscher, FNP is located at home at time of visit.  I discussed the limitations, risks, security and privacy concerns of performing an evaluation and management service by video  and the availability of in person appointments. I also discussed  with the patient that there may be a patient responsible charge related to this service. The patient expressed understanding and agreed to proceed.   Subjective:   HPI:   Patient connects with cargility video stating that she has a migraine. She has a 5 year history of migraine and had been given fiorcet in the past. She was suppose to see headaches clinic several years ago, but due  to pregnancy complications she did not go to appointment. She wa scheduled for head CT sometime back but did not have done.   Review of Systems  Constitutional:  Negative for chills and fever.  HENT: Negative.    Eyes:  Negative for blurred vision, double vision, photophobia, pain, discharge and redness.  Respiratory: Negative.    Gastrointestinal:  Negative for nausea.  Genitourinary: Negative.   Neurological:  Positive for headaches. Negative for dizziness.  All other systems reviewed and are negative.   See pertinent positives and negatives per HPI.  Patient Active Problem List   Diagnosis Date Noted   Normal labor 09/02/2020   Gestational hypertension 09/02/2020   Vaginal delivery 09/02/2020   Elevated liver enzymes 06/09/2020   BMI 37.0-37.9, adult 05/04/2020   Chlamydia infection during pregnancy 04/22/2020   Obesity in pregnancy 04/22/2020   History of gestational hypertension 04/21/2020   Supervision of other high risk pregnancy, antepartum 04/06/2020   Grand multipara 04/06/2020   History of preterm  delivery 04/06/2020   Incisional hernia 10/30/2012    Social History   Tobacco Use   Smoking status: Never   Smokeless tobacco: Never  Substance Use Topics   Alcohol use: Not Currently    Comment: last drink 2 yrs ago    Current Outpatient Medications:    Acetaminophen (TYLENOL) 325 MG CAPS, Take 1,000 mg by mouth. (Patient not taking: No sig reported), Disp: , Rfl:    acetaminophen (TYLENOL) 325 MG tablet, Take 2 tablets (650 mg total) by mouth every 4 (four) hours as needed (for  pain scale < 4). (Patient not taking: Reported on 10/05/2020), Disp: 30 tablet, Rfl: 0   aspirin EC 81 MG tablet, Take 1 tablet (81 mg total) by mouth daily. Swallow whole. (Patient not taking: Reported on 10/05/2020), Disp: 30 tablet, Rfl: 11   citalopram (CELEXA) 20 MG tablet, Take 1/2 tablet daily for one week then take 1 tablet daily (Patient not taking: Reported on 08/19/2020), Disp: 60 tablet, Rfl: 1   coconut oil OIL, Apply 1 application topically as needed. (Patient not taking: Reported on 10/05/2020), Disp: , Rfl: 0   cyclobenzaprine (FLEXERIL) 10 MG tablet, Take 1 tablet (10 mg total) by mouth every 8 (eight) hours as needed (back pain). (Patient not taking: Reported on 06/30/2020), Disp: 30 tablet, Rfl: 0   ibuprofen (ADVIL) 600 MG tablet, Take 1 tablet (600 mg total) by mouth every 6 (six) hours., Disp: 30 tablet, Rfl: 0   Prenatal Vit-Fe Fumarate-FA (MULTIVITAMIN-PRENATAL) 27-0.8 MG TABS tablet, Take 1 tablet by mouth daily at 12 noon. (Patient not taking: Reported on 10/05/2020), Disp: , Rfl:   Allergies  Allergen Reactions   Penicillins Anaphylaxis, Itching, Swelling, Rash and Other (See Comments)    Has patient had a PCN reaction causing immediate rash, facial/tongue/throat swelling, SOB or lightheadedness with hypotension: Yes Has patient had a PCN reaction causing severe rash involving mucus membranes or skin necrosis: No Has patient had a PCN reaction that required hospitalization: No Has patient had a PCN reaction occurring within the last 10 years: No If all of the above answers are "NO", then may proceed with Cephalosporin use.  And swelling    Objective:   There were no vitals taken for this visit.  Patient is well-developed, well-nourished in no acute distress.  Resting comfortably  at home.  Head is normocephalic, atraumatic.  No labored breathing.  Speech is clear and coherent with logical content.  Patient is alert and oriented at baseline.    Assessment and  Plan:        [name] in today with chief complaint of No chief complaint on file.   1. Migraine without aura and without status migrainosus, not intractable Void caffeine Keep diary of headaches Need to see PCP for proper work up Meds ordered this encounter  Medications   meloxicam (MOBIC) 15 MG tablet    Sig: Take 1 tablet (15 mg total) by mouth daily.    Dispense:  30 tablet    Refill:  0    Order Specific Question:   Supervising Provider    Answer:   Eber Hong [[id number]]       The above assessment and management plan was discussed with the patient. The patient verbalized understanding of and has agreed to the management plan. Patient is aware to call the clinic if symptoms persist or worsen. Patient is aware when to return to the clinic for a follow-up visit. Patient educated on when it is appropriate to  go to the emergency department.   Mary-Margaret Daphine Deutscher, FNP  .   Mary-Margaret Daphine Deutscher, FNP 04/04/2021  Time spent with the patient: 15 minutes, of which >50% was spent in obtaining information about symptoms, reviewing previous labs, evaluations, and treatments, counseling about condition (please see the discussed topics above), and developing a plan to further investigate it; had a number of questions which I addressed.

## 2021-04-14 ENCOUNTER — Ambulatory Visit
Admission: EM | Admit: 2021-04-14 | Discharge: 2021-04-14 | Disposition: A | Discharge status: Home / Self Care | Payer: Medicaid Other | Attending: Student | Admitting: Student

## 2021-04-14 ENCOUNTER — Emergency Department (HOSPITAL_COMMUNITY)
Admission: EM | Admit: 2021-04-14 | Discharge: 2021-04-15 | Discharge status: Against Medical Advice | Payer: Medicaid Other | Attending: Student | Admitting: Student

## 2021-04-14 ENCOUNTER — Encounter (HOSPITAL_COMMUNITY): Payer: Self-pay | Admitting: Emergency Medicine

## 2021-04-14 ENCOUNTER — Other Ambulatory Visit: Payer: Self-pay

## 2021-04-14 DIAGNOSIS — K047 Periapical abscess without sinus: Secondary | ICD-10-CM

## 2021-04-14 DIAGNOSIS — K0889 Other specified disorders of teeth and supporting structures: Secondary | ICD-10-CM | POA: Insufficient documentation

## 2021-04-14 DIAGNOSIS — R112 Nausea with vomiting, unspecified: Secondary | ICD-10-CM | POA: Diagnosis not present

## 2021-04-14 DIAGNOSIS — Z5321 Procedure and treatment not carried out due to patient leaving prior to being seen by health care provider: Secondary | ICD-10-CM | POA: Diagnosis not present

## 2021-04-14 DIAGNOSIS — F606 Avoidant personality disorder: Secondary | ICD-10-CM | POA: Diagnosis not present

## 2021-04-14 MED ORDER — DOXYCYCLINE HYCLATE 100 MG PO CAPS: 100.0000 mg | ORAL_CAPSULE | Freq: Two times a day (BID) | ORAL | 0 refills | Status: AC

## 2021-04-14 NOTE — ED Provider Notes (Signed)
[facility]    CSN: [id number] Arrival date & time: 04/14/21  1740      History   Chief Complaint Chief Complaint  Patient presents with   Dental Pain    HPI [name] is a 25 y.o. female presenting with dental infection.  Medical history chlamydia, CKD, gonorrhea, headaches, hernia, obesity, elevated LFTs.  R lower dental pain and swelling. Pain radiating to R ear and neck.  Denies foul taste in mouth, trouble swallowing, dizziness, shortness of breath, fever/chills.  HPI  Past Medical History:  Diagnosis Date   Anxiety    Chlamydia 2018, 2021   Chronic kidney disease    kidney stones and kidney infection with last pregnancy   Depression    "gets in her moods, currently ok'   Gonorrhea 2018   Headache    Incisional hernia 04/2013   Infection    UTI   LGSIL on Pap smear of cervix 2019   Ovarian cyst    Urethral diverticulum     Patient Active Problem List   Diagnosis Date Noted   Normal labor 09/02/2020   Gestational hypertension 09/02/2020   Vaginal delivery 09/02/2020   Elevated liver enzymes 06/09/2020   BMI 37.0-37.9, adult 05/04/2020   Chlamydia infection during pregnancy 04/22/2020   Obesity in pregnancy 04/22/2020   History of gestational hypertension 04/21/2020   Supervision of other high risk pregnancy, antepartum 04/06/2020   Grand multipara 04/06/2020   History of preterm delivery 04/06/2020   Incisional hernia 10/30/2012    Past Surgical History:  Procedure Laterality Date   CHOLECYSTECTOMY  04/03/2012   Procedure: LAPAROSCOPIC CHOLECYSTECTOMY WITH INTRAOPERATIVE CHOLANGIOGRAM;  Surgeon: Velora Heckler, MD;  Location: [facility];  Service: General;  Laterality: N/A;   ERCP  03/12/2012   Procedure: ENDOSCOPIC RETROGRADE CHOLANGIOPANCREATOGRAPHY (ERCP);  Surgeon: Petra Kuba, MD;  Location: [facility] OR;  Service: Endoscopy;  Laterality: N/A;   INGUINAL HERNIA REPAIR N/A 05/15/2013   Procedure: HERNIA REPAIR INCISIONAL;  Surgeon: Velora Heckler, MD;  Location: [facility];  Service: General;  Laterality: N/A;   INSERTION OF MESH N/A 05/15/2013   Procedure: INSERTION OF MESH;  Surgeon: Velora Heckler, MD;  Location: [facility];  Service: General;  Laterality: N/A;   URETERAL STENT PLACEMENT     WISDOM TOOTH EXTRACTION      OB History     Gravida  8   Para  5   Term  2   Preterm  3   AB  3   Living  5      SAB  3   IAB  0   Ectopic  0   Multiple  0   Live Births  5            Home Medications    Prior to Admission medications   Medication Sig Start Date End Date Taking? Authorizing Provider  doxycycline (VIBRAMYCIN) 100 MG capsule Take 1 capsule (100 mg total) by mouth 2 (two) times daily for 7 days. 04/14/21 04/21/21 Yes Rhys Martini, PA-C  ibuprofen (ADVIL) 600 MG tablet Take 1 tablet (600 mg total) by mouth every 6 (six) hours. 09/03/20   Fayette Pho, MD  naproxen (NAPROSYN) 500 MG tablet Take 1 tablet (500 mg total) by mouth 2 (two) times daily with a meal. 04/04/21   Bennie Pierini, FNP    Family History Family History  Problem Relation Age of Onset   Hypertension Mother  Hypertension Father    Diabetes Paternal Grandmother    Cancer Paternal Grandfather    Seizures Paternal Grandfather     Social History Social History   Tobacco Use   Smoking status: Never   Smokeless tobacco: Never  Vaping Use   Vaping Use: Never used  Substance Use Topics   Alcohol use: Not Currently    Comment: last drink 2 yrs ago   Drug use: No     Allergies   Penicillins   Review of Systems Review of Systems  HENT:  Positive for dental problem.   All other systems reviewed and are negative.   Physical Exam Triage Vital Signs ED Triage Vitals  Enc Vitals Group     BP 04/14/21 1938 128/79     Pulse Rate 04/14/21 1938 63     Resp 04/14/21 1938 18     Temp 04/14/21 1938 98.2 F (36.8 C)     Temp Source 04/14/21 1938 Oral     SpO2 04/14/21 1938 98  %     Weight --      Height --      Head Circumference --      Peak Flow --      Pain Score 04/14/21 1939 10     Pain Loc --      Pain Edu? --      Excl. in GC? --    No data found.  Updated Vital Signs BP 128/79 (BP Location: Left Arm)   Pulse 63   Temp 98.2 F (36.8 C) (Oral)   Resp 18   LMP 03/27/2021   SpO2 98%   Breastfeeding No   Visual Acuity Right Eye Distance:   Left Eye Distance:   Bilateral Distance:    Right Eye Near:   Left Eye Near:    Bilateral Near:     Physical Exam Vitals reviewed.  Constitutional:      General: She is not in acute distress.    Appearance: Normal appearance. She is not ill-appearing, toxic-appearing or diaphoretic.  HENT:     Head: Normocephalic and atraumatic.     Jaw: There is normal jaw occlusion. No trismus, tenderness, swelling, pain on movement or malocclusion.     Salivary Glands: Right salivary gland is not diffusely enlarged or tender. Left salivary gland is not diffusely enlarged or tender.     Right Ear: Hearing normal.     Left Ear: Hearing normal.     Nose: Nose normal.     Mouth/Throat:     Lips: Pink.     Mouth: Mucous membranes are moist. No lacerations or oral lesions.     Dentition: Abnormal dentition. Does not have dentures. Dental tenderness and dental caries present.     Tongue: No lesions. Tongue does not deviate from midline.     Palate: No mass.     Pharynx: Oropharynx is clear. Uvula midline. No oropharyngeal exudate or posterior oropharyngeal erythema.     Tonsils: No tonsillar exudate or tonsillar abscesses.     Comments: Poor dentician  Gingival swelling and tenderness surrounding R lower posterior molars No trismus, drooling, sore throat, voice changes, swelling underneath the tongue, swelling underneath the jaw, neck stiffness.  Eyes:     Extraocular Movements: Extraocular movements intact.     Pupils: Pupils are equal, round, and reactive to light.  Pulmonary:     Effort: Pulmonary effort is  normal.  Lymphadenopathy:     Cervical: Cervical adenopathy present.  Right cervical: Superficial cervical adenopathy present.  Neurological:     General: No focal deficit present.     Mental Status: She is alert and oriented to person, place, and time.  Psychiatric:        Mood and Affect: Mood normal.        Behavior: Behavior normal.        Thought Content: Thought content normal.        Judgment: Judgment normal.     UC Treatments / Results  Labs (all labs ordered are listed, but only abnormal results are displayed) Labs Reviewed - No data to display  EKG   Radiology No results found.  Procedures Procedures (including critical care time)  Medications Ordered in UC Medications - No data to display  Initial Impression / Assessment and Plan / UC Course  I have reviewed the triage vital signs and the nursing notes.  Pertinent labs & imaging results that were available during my care of the patient were reviewed by me and considered in my medical decision making (see chart for details).     This patient is a very pleasant 25 y.o. year old female presenting with dental infection. Afebrile, nontachycardic. No trismus, drooling, sore throat, voice changes, swelling underneath the tongue, swelling underneath the jaw, neck stiffness.  She states she is not pregnant or breastfeeding. Penicillin allergic, doxycycline sent.  Creatinine in normal range last CMP.  Follow-up with dentist as scheduled on 6/28  ED return precautions discussed. Patient verbalizes understanding and agreement.    Final Clinical Impressions(s) / UC Diagnoses   Final diagnoses:  Dental infection     Discharge Instructions      -Doxycycline twice daily for 7 days.  Make sure to wear sunscreen while spending time outside while on this medication as it can increase your chance of sunburn. You can take this medication with food if you have a sensitive stomach. -For pain, Take Tylenol 1000 mg  3 times daily, and ibuprofen 800 mg 3 times daily with food.  You can take these together, or alternate every 3-4 hours. -Follow-up with dentist as scheduled on 6/28     ED Prescriptions     Medication Sig Dispense Auth. Provider   doxycycline (VIBRAMYCIN) 100 MG capsule Take 1 capsule (100 mg total) by mouth 2 (two) times daily for 7 days. 14 capsule Rhys Martini, PA-C      PDMP not reviewed this encounter.   Rhys Martini, PA-C 04/14/21 1958

## 2021-04-14 NOTE — ED Triage Notes (Signed)
Pt BIB [facility] from home, seen at [facility] today for toothache and started on antibiotics. C/o nausea and vomiting after first dose. Pt is tearful and anxious.

## 2021-04-14 NOTE — ED Provider Notes (Signed)
Emergency Medicine Provider Triage Evaluation Note  [name] , a 25 y.o. female  was evaluated in triage.  Pt complains of n/v. Seen @ UC for dental pain, given doxycycline, took this tonight had nausea & vomiting. She is very upset.   Review of Systems  Positive: Dental pain, N/V Negative: Abdominal pain, diarrhea  Physical Exam  BP 108/62 (BP Location: Right Arm)   Pulse 72   Temp 98.3 F (36.8 C) (Oral)   Resp (!) 24   LMP 03/27/2021   SpO2 94%  Gen:   Awake, tearful Resp:  Normal effort  MSK:   Moves extremities without difficulty  Other:  Abdomen nontender. Tolerating own secretions without difficulty.   Medical Decision Making  Medically screening exam initiated at 11:03 PM.  Appropriate orders placed.  [name] was informed that the remainder of the evaluation will be completed by another provider, this initial triage assessment does not replace that evaluation, and the importance of remaining in the ED until their evaluation is complete.  N/V   Desmond Lope 04/14/21 2307    Nira Conn, MD 04/23/21 [phone]

## 2021-04-14 NOTE — Discharge Instructions (Addendum)
-  Doxycycline twice daily for 7 days.  Make sure to wear sunscreen while spending time outside while on this medication as it can increase your chance of sunburn. You can take this medication with food if you have a sensitive stomach. -For pain, Take Tylenol 1000 mg 3 times daily, and ibuprofen 800 mg 3 times daily with food.  You can take these together, or alternate every 3-4 hours. -Follow-up with dentist as scheduled on 6/28

## 2021-04-14 NOTE — ED Triage Notes (Signed)
Pt c/o rt lower toothache that's cause rt ear pain and rt neck pain since yesterday.

## 2021-04-15 LAB — COMPREHENSIVE METABOLIC PANEL
ALT: 19 U/L (ref 0–44)
AST: 23 U/L (ref 15–41)
Albumin: 3.8 g/dL (ref 3.5–5.0)
Alkaline Phosphatase: 83 U/L (ref 38–126)
Anion gap: 9 (ref 5–15)
BUN: 7 mg/dL (ref 6–20)
CO2: 24 mmol/L (ref 22–32)
Calcium: 9.2 mg/dL (ref 8.9–10.3)
Chloride: 106 mmol/L (ref 98–111)
Creatinine, Ser: 0.63 mg/dL (ref 0.44–1.00)
GFR, Estimated: >60 mL/min (ref >60)
Glucose, Bld: 102 mg/dL — ABNORMAL HIGH (ref 70–99)
Potassium: 3.7 mmol/L (ref 3.5–5.1)
Sodium: 139 mmol/L (ref 135–145)
Total Bilirubin: 0.2 mg/dL — ABNORMAL LOW (ref 0.3–1.2)
Total Protein: 6.5 g/dL (ref 6.5–8.1)

## 2021-04-15 LAB — CBC WITH DIFFERENTIAL/PLATELET
Abs Immature Granulocytes: 0.04 10*3/uL (ref 0.00–0.07)
Basophils Absolute: 0 10*3/uL (ref 0.0–0.1)
Basophils Relative: 0 %
Eosinophils Absolute: 0 10*3/uL (ref 0.0–0.5)
Eosinophils Relative: 0 %
HCT: 38.2 % (ref 36.0–46.0)
Hemoglobin: 13.5 g/dL (ref 12.0–15.0)
Immature Granulocytes: 0 %
Lymphocytes Relative: 25 %
Lymphs Abs: 2.3 10*3/uL (ref 0.7–4.0)
MCH: 32.3 pg (ref 26.0–34.0)
MCHC: 35.3 g/dL (ref 30.0–36.0)
MCV: 91.4 fL (ref 80.0–100.0)
Monocytes Absolute: 0.5 10*3/uL (ref 0.1–1.0)
Monocytes Relative: 6 %
Neutro Abs: 6.4 10*3/uL (ref 1.7–7.7)
Neutrophils Relative %: 69 %
Platelets: 246 10*3/uL (ref 150–400)
RBC: 4.18 MIL/uL (ref 3.87–5.11)
RDW: 12.4 % (ref 11.5–15.5)
WBC: 9.4 10*3/uL (ref 4.0–10.5)
nRBC: 0 % (ref 0.0–0.2)

## 2021-04-15 LAB — I-STAT BETA HCG BLOOD, ED (MC, WL, AP ONLY): I-stat hCG, quantitative: <5 m[IU]/mL (ref <5)

## 2021-04-15 LAB — LIPASE, BLOOD: Lipase: 26 U/L (ref 11–51)

## 2021-04-15 NOTE — ED Notes (Signed)
Pt is upset walking toward lobby exit. Pt upset that the wait is so long. Pt encourage to stay. Pt refuses and continues walking. Wrist band removed by patient. Pt seen walking to parking lot with family member.

## 2021-06-13 ENCOUNTER — Other Ambulatory Visit (HOSPITAL_COMMUNITY)
Admission: RE | Admit: 2021-06-13 | Discharge: 2021-06-13 | Disposition: A | Discharge status: Home / Self Care | Payer: Medicaid Other | Source: Ambulatory Visit | Attending: Obstetrics and Gynecology | Admitting: Obstetrics and Gynecology

## 2021-06-13 ENCOUNTER — Other Ambulatory Visit: Payer: Self-pay

## 2021-06-13 ENCOUNTER — Ambulatory Visit (INDEPENDENT_AMBULATORY_CARE_PROVIDER_SITE_OTHER): Payer: Medicaid Other | Admitting: Obstetrics and Gynecology

## 2021-06-13 ENCOUNTER — Encounter: Payer: Self-pay | Admitting: Obstetrics and Gynecology

## 2021-06-13 VITALS — BP 118/74 | HR 96 | Ht 64.0 in | Wt 244.8 lb

## 2021-06-13 DIAGNOSIS — N920 Excessive and frequent menstruation with regular cycle: Secondary | ICD-10-CM

## 2021-06-13 DIAGNOSIS — Z0000 Encounter for general adult medical examination without abnormal findings: Secondary | ICD-10-CM

## 2021-06-13 DIAGNOSIS — Z01419 Encounter for gynecological examination (general) (routine) without abnormal findings: Secondary | ICD-10-CM

## 2021-06-13 DIAGNOSIS — F419 Anxiety disorder, unspecified: Secondary | ICD-10-CM

## 2021-06-13 MED ORDER — FLUOXETINE HCL 10 MG PO CAPS
10.0000 mg | ORAL_CAPSULE | Freq: Every day | ORAL | 3 refills | Status: DC
Start: 2021-06-13 — End: 2021-12-29

## 2021-06-13 MED ORDER — TRANEXAMIC ACID 650 MG PO TABS: 1300.0000 mg | ORAL_TABLET | Freq: Three times a day (TID) | ORAL | 2 refills | Status: DC

## 2021-06-13 NOTE — Progress Notes (Signed)
GYNECOLOGY ANNUAL PREVENTATIVE CARE ENCOUNTER NOTE  History:     [name] is a 25 y.o. [phone] female here for a routine annual gynecologic exam.  Current complaints: menorrhagia - she notes HVB since her last delivery in January that seems to be getting worse. She bleeds heavily for 7 days. She changes her cup every 1-2 hours. She feels fatigued during that time.  She has tried every form of birth control in the past and has not been able to tolerate them.  She also notes trouble with weight loss and anxiety. She notes severe cramps with her cycle. The bleeding is heavy throughout those 7 days. Cycles are otherwise regular.  She is not sexually active currently but her partner is a woman.   Denies abnormal vaginal bleeding, discharge, pelvic pain, problems with intercourse or other gynecologic concerns.      Gynecologic History Patient's last menstrual period was 05/30/2021. Contraception: none Last Pap: 2017. Result was normal with negative HPV  The following portions of the patient's history were reviewed and updated as appropriate: allergies, current medications, past family history, past medical history, past social history, past surgical history and problem list.  Review of Systems Pertinent items noted in HPI and remainder of comprehensive ROS otherwise negative.  Physical Exam:  BP 118/74   Pulse 96   Wt 244 lb 12.8 oz (111 kg)   LMP 05/30/2021   BMI 40.74 kg/m  CONSTITUTIONAL: Well-developed, well-nourished female in no acute distress.  HENT:  Normocephalic, atraumatic, External right and left ear normal.  EYES: Conjunctivae and EOM are normal. Pupils are equal, round, and reactive to light. No scleral icterus.  NECK: Normal range of motion, supple, no masses.  Normal thyroid.  SKIN: Skin is warm and dry. No rash noted. Not diaphoretic. No erythema. No pallor. MUSCULOSKELETAL: Normal range of motion. No tenderness.  No cyanosis, clubbing, or  edema. NEUROLOGIC: Alert and oriented to person, place, and time. Normal reflexes, muscle tone coordination.  PSYCHIATRIC: Normal mood and affect. Normal behavior. Normal judgment and thought content.  CARDIOVASCULAR: Normal heart rate noted, regular rhythm RESPIRATORY: Clear to auscultation bilaterally. Effort and breath sounds normal, no problems with respiration noted.  BREASTS: Symmetric in size. No masses, tenderness, skin changes, nipple drainage, or lymphadenopathy bilaterally. Performed in the presence of a chaperone. ABDOMEN: Soft, no distention noted.  No tenderness, rebound or guarding.  PELVIC: External genitalia normal, Vagina normal without discharge, no bladder tenderness, cervix normal in appearance, no CMT, uterus normal size, shape, and consistency, no adnexal masses or tenderness, exam obscured by obesity. Performed in the presence of a chaperone.    Assessment and Plan:    1. Encounter for annual routine gynecological examination - Cervical cancer screening: Discussed screening Q3 years. Reviewed importance of annual exams and limits of pap smear. Pap with reflex HPV done yes - GC/CT: Discussed and recommended. Pt  declines - Gardasil:  Counseling provided along with information at home. She is unsure if vaccinated but will check.  - Birth Control: Discussed options and their risks, benefits and common side effects; discussed VTE with estrogen containing options. Declines.  - Breast Health: Encouraged self breast awareness/exams. Teaching provided. - Follow-up: 12 months and prn - Cytology - PAP( [place]) - HgB A1c  2. Menorrhagia with regular cycle - Discussed different possible etiologies. She has tried all forms of birth control. She declines hormonal control of cycle at this time. She would be interested in trying TXA and complete eval  in the meantime.  - US PELVIC COMPLETE WITH TRANSVAGINAL; Future - TSH - CBC - tranexamic acid (LYSTEDA) 650 MG TABS tablet;  Take 2 tablets (1,300 mg total) by mouth 3 (three) times daily. Take during menses for a maximum of five days  Dispense: 30 tablet; Refill: 2  3. Anxiety - Discussed option for therapy. In the meantime, she would like to do medical therapy - If not improved, discussed would see PCP. She has one through the health department - FLUoxetine (PROZAC) 10 MG capsule; Take 1 capsule (10 mg total) by mouth daily.  Dispense: 30 capsule; Refill: 3   Routine preventative health maintenance measures emphasized. Please refer to After Visit Summary for other counseling recommendations.      Milas Hock, MD, FACOG Obstetrician & Gynecologist, [facility] for [facility], [facility]

## 2021-06-14 LAB — CBC
Hematocrit: 39.9 % (ref 34.0–46.6)
Hemoglobin: 13.6 g/dL (ref 11.1–15.9)
MCH: 31.5 pg (ref 26.6–33.0)
MCHC: 34.1 g/dL (ref 31.5–35.7)
MCV: 92 fL (ref 79–97)
Platelets: 285 10*3/uL (ref 150–450)
RBC: 4.32 x10E6/uL (ref 3.77–5.28)
RDW: 12.5 % (ref 11.7–15.4)
WBC: 6.4 10*3/uL (ref 3.4–10.8)

## 2021-06-14 LAB — TSH: TSH: 2.32 u[IU]/mL (ref 0.450–4.500)

## 2021-06-14 LAB — HEMOGLOBIN A1C
Est. average glucose Bld gHb Est-mCnc: 97 mg/dL
Hgb A1c MFr Bld: 5 % (ref 4.8–5.6)

## 2021-06-16 LAB — CYTOLOGY - PAP
Adequacy: ABSENT
Comment: NEGATIVE
Diagnosis: UNDETERMINED — AB
High risk HPV: POSITIVE — AB

## 2021-07-14 ENCOUNTER — Other Ambulatory Visit: Payer: Self-pay

## 2021-07-14 ENCOUNTER — Ambulatory Visit
Admission: RE | Admit: 2021-07-14 | Discharge: 2021-07-14 | Disposition: A | Discharge status: Home / Self Care | Payer: Medicaid Other | Source: Ambulatory Visit | Attending: Obstetrics and Gynecology | Admitting: Obstetrics and Gynecology

## 2021-07-14 DIAGNOSIS — N920 Excessive and frequent menstruation with regular cycle: Secondary | ICD-10-CM | POA: Diagnosis not present

## 2021-07-19 ENCOUNTER — Encounter: Payer: Medicaid Other | Admitting: Obstetrics and Gynecology

## 2021-07-19 ENCOUNTER — Institutional Professional Consult (permissible substitution): Payer: Medicaid Other | Admitting: Licensed Clinical Social Worker

## 2021-08-26 ENCOUNTER — Telehealth: Payer: Medicaid Other | Admitting: Nurse Practitioner

## 2021-08-26 DIAGNOSIS — M545 Low back pain, unspecified: Secondary | ICD-10-CM | POA: Diagnosis not present

## 2021-08-27 ENCOUNTER — Telehealth: Payer: Medicaid Other | Admitting: Family

## 2021-08-27 DIAGNOSIS — J209 Acute bronchitis, unspecified: Secondary | ICD-10-CM

## 2021-08-27 MED ORDER — NAPROXEN 500 MG PO TABS: 500.0000 mg | ORAL_TABLET | Freq: Two times a day (BID) | ORAL | 0 refills | Status: DC

## 2021-08-27 MED ORDER — CYCLOBENZAPRINE HCL 10 MG PO TABS: 10.0000 mg | ORAL_TABLET | Freq: Three times a day (TID) | ORAL | 0 refills | Status: DC | PRN

## 2021-08-27 NOTE — Progress Notes (Signed)

## 2021-08-28 MED ORDER — BENZONATATE 100 MG PO CAPS: 100.0000 mg | ORAL_CAPSULE | Freq: Three times a day (TID) | ORAL | 0 refills | Status: DC | PRN

## 2021-08-28 MED ORDER — PREDNISONE 10 MG (21) PO TBPK: ORAL_TABLET | ORAL | 0 refills | Status: DC

## 2021-08-28 NOTE — Progress Notes (Signed)
We are sorry that you are not feeling well.  Here is how we plan to help!  Based on your presentation I believe you most likely have A cough due to a virus.  This is called viral bronchitis and is best treated by rest, plenty of fluids and control of the cough.  You may use Ibuprofen or Tylenol as directed to help your symptoms.     In addition you may use A non-prescription cough medication called Robitussin DAC. Take 2 teaspoons every 8 hours or Delsym: take 2 teaspoons every 12 hours., A non-prescription cough medication called Mucinex DM: take 2 tablets every 12 hours., and A prescription cough medication called Tessalon Perles 100mg. You may take 1-2 capsules every 8 hours as needed for your cough.  Prednisone 10 mg daily for 6 days (see taper instructions below)  From your responses in the eVisit questionnaire you describe inflammation in the upper respiratory tract which is causing a significant cough.  This is commonly called Bronchitis and has four common causes:   Allergies Viral Infections Acid Reflux Bacterial Infection Allergies, viruses and acid reflux are treated by controlling symptoms or eliminating the cause. An example might be a cough caused by taking certain blood pressure medications. You stop the cough by changing the medication. Another example might be a cough caused by acid reflux. Controlling the reflux helps control the cough.  USE OF BRONCHODILATOR ("RESCUE") INHALERS: There is a risk from using your bronchodilator too frequently.  The risk is that over-reliance on a medication which only relaxes the muscles surrounding the breathing tubes can reduce the effectiveness of medications prescribed to reduce swelling and congestion of the tubes themselves.  Although you feel brief relief from the bronchodilator inhaler, your asthma may actually be worsening with the tubes becoming more swollen and filled with mucus.  This can delay other crucial treatments, such as oral  steroid medications. If you need to use a bronchodilator inhaler daily, several times per day, you should discuss this with your provider.  There are probably better treatments that could be used to keep your asthma under control.     HOME CARE Only take medications as instructed by your medical team. Complete the entire course of an antibiotic. Drink plenty of fluids and get plenty of rest. Avoid close contacts especially the very young and the elderly Cover your mouth if you cough or cough into your sleeve. Always remember to wash your hands A steam or ultrasonic humidifier can help congestion.   GET HELP RIGHT AWAY IF: You develop worsening fever. You become short of breath You cough up blood. Your symptoms persist after you have completed your treatment plan MAKE SURE YOU  Understand these instructions. Will watch your condition. Will get help right away if you are not doing well or get worse.    Thank you for choosing an e-visit.  Your e-visit answers were reviewed by a board certified advanced clinical practitioner to complete your personal care plan. Depending upon the condition, your plan could have included both over the counter or prescription medications.  Please review your pharmacy choice. Make sure the pharmacy is open so you can pick up prescription now. If there is a problem, you may contact your provider through MyChart messaging and have the prescription routed to another pharmacy.  Your safety is important to us. If you have drug allergies check your prescription carefully.   For the next 24 hours you can use MyChart to ask questions about today's   visit, request a non-urgent call back, or ask for a work or school excuse. You will get an email in the next two days asking about your experience. I hope that your e-visit has been valuable and will speed your recovery.  Approximately 5 minutes was spent documenting and reviewing patient's chart.    

## 2021-09-03 ENCOUNTER — Telehealth: Payer: Medicaid Other | Admitting: Nurse Practitioner

## 2021-09-03 DIAGNOSIS — J209 Acute bronchitis, unspecified: Secondary | ICD-10-CM

## 2021-09-03 MED ORDER — ALBUTEROL SULFATE HFA 108 (90 BASE) MCG/ACT IN AERS: 2.0000 | INHALATION_SPRAY | Freq: Four times a day (QID) | RESPIRATORY_TRACT | 0 refills | Status: DC | PRN

## 2021-09-03 MED ORDER — PREDNISONE 20 MG PO TABS: 40.0000 mg | ORAL_TABLET | Freq: Every day | ORAL | 0 refills | Status: AC

## 2021-09-03 NOTE — Progress Notes (Signed)
I have spent 5 minutes in review of e-visit questionnaire, review and updating patient chart, medical decision making and response to patient.  ° °Tiras Bianchini W Reise Gladney, NP ° °  °

## 2021-09-03 NOTE — Progress Notes (Signed)
Visit for Asthma  Based on what you have shared with me, it looks like you may have a flare up of your asthma.  Asthma is a chronic (ongoing) lung disease which results in airway obstruction, inflammation and hyper-responsiveness.   Asthma symptoms vary from person to person, with common symptoms including nighttime awakening and decreased ability to participate in normal activities as a result of shortness of breath. It is often triggered by changes in weather, changes in the season, changes in air temperature, or inside (home, school, daycare or work) allergens such as animal dander, mold, mildew, woodstoves or cockroaches.   It can also be triggered by hormonal changes, extreme emotion, physical exertion or an upper respiratory tract illness.     It is important to identify the trigger, and then eliminate or avoid the trigger if possible.   If you have been prescribed medications to be taken on a regular basis, it is important to follow the asthma action plan and to follow guidelines to adjust medication in response to increasing symptoms of decreased peak expiratory flow rate  Treatment: I have prescribed: Albuterol (Proventil HFA; Ventolin HFA) 108 (90 Base) MCG/ACT Inhaler 2 puffs into the lungs every six hours as needed for wheezing or shortness of breath and prednisone tablets.  If they are not effective you will need to be evaluated in office at an urgent care or establish with a PCP for possible chest xray and face to face work up.  HOME CARE Only take medications as instructed by your medical team. Consider wearing a mask or scarf to improve breathing air temperature have been shown to decrease irritation and decrease exacerbations Get rest. Taking a steamy shower or using a humidifier may help nasal congestion sand ease sore throat pain. You can place a towel over your head  and breathe in the steam from hot water coming from a faucet. Using a saline nasal spray works much the same way.  Cough drops, hare candies and sore throat lozenges may ease your cough.  Avoid close contacts especially the very you and the elderly Cover your mouth if you cough or sneeze Always remember to wash your hands.    GET HELP RIGHT AWAY IF: You develop worsening symptoms; breathlessness at rest, drowsy, confused or agitated, unable to speak in full sentences You have coughing fits You develop a severe headache or visual changes You develop shortness of breath, difficulty breathing or start having chest pain Your symptoms persist after you have completed your treatment plan If your symptoms do not improve within 10 days  MAKE SURE YOU Understand these instructions. Will watch your condition. Will get help right away if you are not doing well or get worse.   Your e-visit answers were reviewed by a board certified advanced clinical practitioner to complete your personal care plan, Depending upon the condition, your plan could have included both over the counter or prescription medications.   Please review your pharmacy choice. Your safety is important to [place]. If you have drug allergies check your prescription carefully.  You can use MyChart to ask questions about today's visit, request a non-urgent  call back, or ask for a work or school excuse for 24 hours related to this e-Visit. If it has been greater than 24 hours you will need to follow up with your provider, or enter a new e-Visit to address those concerns.   You will get an e-mail in the next two days asking about your experience. I hope that  your e-visit has been valuable and will speed your recovery. Thank you for using e-visits.  

## 2021-09-08 ENCOUNTER — Ambulatory Visit: Admit: 2021-09-08 | Disposition: A | Discharge status: Home / Self Care | Payer: Self-pay | Source: Home / Self Care

## 2021-09-08 ENCOUNTER — Telehealth: Payer: Medicaid Other | Admitting: Emergency Medicine

## 2021-09-08 DIAGNOSIS — R0602 Shortness of breath: Secondary | ICD-10-CM

## 2021-09-08 DIAGNOSIS — Z4800 Encounter for change or removal of nonsurgical wound dressing: Secondary | ICD-10-CM

## 2021-09-08 DIAGNOSIS — R051 Acute cough: Secondary | ICD-10-CM

## 2021-09-08 NOTE — Progress Notes (Signed)
[place],  Based on what you shared with me, I feel your condition warrants further evaluation and I recommend that you be seen in a face to face visit.  It is concerning that you are still having cough and shortness of breath despite inhaler and steroid prescriptions sent in on 11/12 via e-visit.  You should be evaluated in person to rule out pneumonia or another cause for your symptoms.     NOTE: There will be NO CHARGE for this eVisit   If you are having a true medical emergency please call 911.      For an urgent face to face visit, [facility] has six urgent care centers for your convenience:     [facility] Health Urgent Care Center at [facility] Directions [phone] [address] [place], [place] [address]    [facility] Health Urgent Care Center [facility]) Get Driving Directions [phone] [address] [place], [place] [address]  [facility] Health Urgent Care Center [facility]) Get Driving Directions [phone] [address] [place],  [place]  [address]  [facility] Health Urgent Care at [facility] Get Driving Directions [phone] [address] [place], [place] [address]   [facility] Health Urgent Care at [facility] Get Driving Directions  [phone] [address] [place], [place] [address]   [facility] Health Urgent Care at [facility] Directions [phone] [address] [place], [place] [address]  Your MyChart E-visit questionnaire answers were reviewed by a board certified advanced clinical practitioner to complete your personal care plan based on your specific symptoms.  Thank you for using e-Visits.

## 2021-09-12 ENCOUNTER — Other Ambulatory Visit: Payer: Self-pay

## 2021-09-12 ENCOUNTER — Emergency Department (HOSPITAL_COMMUNITY)
Admission: EM | Admit: 2021-09-12 | Discharge: 2021-09-12 | Disposition: A | Discharge status: Home / Self Care | Payer: Medicaid Other | Attending: Emergency Medicine | Admitting: Emergency Medicine

## 2021-09-12 ENCOUNTER — Emergency Department (HOSPITAL_COMMUNITY): Payer: Medicaid Other

## 2021-09-12 ENCOUNTER — Encounter (HOSPITAL_COMMUNITY): Payer: Self-pay

## 2021-09-12 DIAGNOSIS — R072 Precordial pain: Secondary | ICD-10-CM | POA: Diagnosis not present

## 2021-09-12 DIAGNOSIS — Z20822 Contact with and (suspected) exposure to covid-19: Secondary | ICD-10-CM | POA: Diagnosis not present

## 2021-09-12 DIAGNOSIS — R059 Cough, unspecified: Secondary | ICD-10-CM | POA: Insufficient documentation

## 2021-09-12 DIAGNOSIS — I129 Hypertensive chronic kidney disease with stage 1 through stage 4 chronic kidney disease, or unspecified chronic kidney disease: Secondary | ICD-10-CM | POA: Diagnosis not present

## 2021-09-12 DIAGNOSIS — R062 Wheezing: Secondary | ICD-10-CM | POA: Diagnosis not present

## 2021-09-12 DIAGNOSIS — Z79899 Other long term (current) drug therapy: Secondary | ICD-10-CM | POA: Insufficient documentation

## 2021-09-12 DIAGNOSIS — R052 Subacute cough: Secondary | ICD-10-CM

## 2021-09-12 DIAGNOSIS — N189 Chronic kidney disease, unspecified: Secondary | ICD-10-CM | POA: Diagnosis not present

## 2021-09-12 DIAGNOSIS — R079 Chest pain, unspecified: Secondary | ICD-10-CM

## 2021-09-12 LAB — BASIC METABOLIC PANEL
Anion gap: 8 (ref 5–15)
BUN: 9 mg/dL (ref 6–20)
CO2: 28 mmol/L (ref 22–32)
Calcium: 8.8 mg/dL — ABNORMAL LOW (ref 8.9–10.3)
Chloride: 102 mmol/L (ref 98–111)
Creatinine, Ser: 0.57 mg/dL (ref 0.44–1.00)
GFR, Estimated: >60 mL/min (ref >60)
Glucose, Bld: 88 mg/dL (ref 70–99)
Potassium: 3.8 mmol/L (ref 3.5–5.1)
Sodium: 138 mmol/L (ref 135–145)

## 2021-09-12 LAB — CBC
HCT: 41.5 % (ref 36.0–46.0)
Hemoglobin: 14.1 g/dL (ref 12.0–15.0)
MCH: 31.5 pg (ref 26.0–34.0)
MCHC: 34 g/dL (ref 30.0–36.0)
MCV: 92.6 fL (ref 80.0–100.0)
Platelets: 245 10*3/uL (ref 150–400)
RBC: 4.48 MIL/uL (ref 3.87–5.11)
RDW: 12.5 % (ref 11.5–15.5)
WBC: 6 10*3/uL (ref 4.0–10.5)
nRBC: 0 % (ref 0.0–0.2)

## 2021-09-12 LAB — RESP PANEL BY RT-PCR (FLU A&B, COVID) ARPGX2
Influenza A by PCR: NEGATIVE
Influenza B by PCR: NEGATIVE
SARS Coronavirus 2 by RT PCR: NEGATIVE

## 2021-09-12 LAB — TROPONIN I (HIGH SENSITIVITY): Troponin I (High Sensitivity): <2 ng/L (ref <18)

## 2021-09-12 MED ORDER — BENZONATATE 100 MG PO CAPS: 100.0000 mg | ORAL_CAPSULE | Freq: Three times a day (TID) | ORAL | 0 refills | Status: DC

## 2021-09-12 MED ORDER — SODIUM CHLORIDE 0.9 % IV BOLUS
1000.0000 mL | Freq: Once | INTRAVENOUS | Status: AC
  Administered 2021-09-12: 1000 mL via INTRAVENOUS

## 2021-09-12 NOTE — ED Triage Notes (Signed)
Patient c/o cough x several months and states she coughs and has mucus that is stuck in her throat x several days.  Patient denies chills, fever, chills, body aches, or sore throat.

## 2021-09-12 NOTE — Discharge Instructions (Addendum)
Your blood work today was reassuring and without any signs of infection.  For your chest pain but did evaluate your cardiac enzyme, chest x-ray, EKG which were all normal.  Your chest pain is unlikely coming from heart or lung problem.  It may be related to your ongoing cough.  Continue using your albuterol inhaler as you need to.  I have sent in a cough medicine for you.  I recommend you establish care with a primary care provider and have a good work-up for asthma.  If you develop shortness of breath or worsening chest pain please return to the emergency room.

## 2021-09-12 NOTE — ED Notes (Signed)
Pt states understanding of dc instructions, importance of follow up, and prescription. Pt denies questions or concerns upon dc. Pt declined wheelchair assistance upon dc. Pt ambulated out of ed w/ steady gait. No belongings left in room upon dc.  

## 2021-09-12 NOTE — ED Provider Notes (Signed)
[facility]-EMERGENCY DEPT Provider Note   CSN: [id number] Arrival date & time: 09/12/21  1042     History Chief Complaint  Patient presents with   Cough    [place] Milyn Stapleton is a 25 y.o. female.  25 year old female presents today for evaluation of 19-month duration of dry cough that became productive 3 days ago and substernal pressure-like chest pain onset 8 AM this morning.  Patient reports her chest pain has not improved or worsened and does not radiate.  She denies symptoms of diaphoresis, abdominal pain, nausea, vomiting.  She has had multiple ED visits for her URI complaints where she has been prescribed albuterol inhaler and course of prednisone without improvement and stated her cough has become productive.  She was referred to urgent care on Friday to have a chest x-ray to rule out pneumonia but was not evaluated given urgent care did not have a chest x-ray onsite.  She was unable to present today given she had no childcare.  She denies fever, chills, hemoptysis, or shortness of breath.  She reports her coughing spells are random and sometimes associated with wheezing.  The history is provided by the patient. No language interpreter was used.      Past Medical History:  Diagnosis Date   Anxiety    Chlamydia 2018, 2021   Chronic kidney disease    kidney stones and kidney infection with last pregnancy   Depression    "gets in her moods, currently ok'   Gonorrhea 2018   Headache    Incisional hernia 04/2013   Infection    UTI   LGSIL on Pap smear of cervix 2019   Ovarian cyst    Urethral diverticulum     Patient Active Problem List   Diagnosis Date Noted   Gestational hypertension 09/02/2020   Elevated liver enzymes 06/09/2020   BMI 37.0-37.9, adult 05/04/2020   History of gestational hypertension 04/21/2020   History of preterm delivery 04/06/2020    Past Surgical History:  Procedure Laterality Date   CHOLECYSTECTOMY  04/03/2012    Procedure: LAPAROSCOPIC CHOLECYSTECTOMY WITH INTRAOPERATIVE CHOLANGIOGRAM;  Surgeon: Velora Heckler, MD;  Location: [facility];  Service: General;  Laterality: N/A;   ERCP  03/12/2012   Procedure: ENDOSCOPIC RETROGRADE CHOLANGIOPANCREATOGRAPHY (ERCP);  Surgeon: Petra Kuba, MD;  Location: [facility] OR;  Service: Endoscopy;  Laterality: N/A;   INGUINAL HERNIA REPAIR N/A 05/15/2013   Procedure: HERNIA REPAIR INCISIONAL;  Surgeon: Velora Heckler, MD;  Location: [facility];  Service: General;  Laterality: N/A;   INSERTION OF MESH N/A 05/15/2013   Procedure: INSERTION OF MESH;  Surgeon: Velora Heckler, MD;  Location: [facility];  Service: General;  Laterality: N/A;   URETERAL STENT PLACEMENT     WISDOM TOOTH EXTRACTION       OB History     Gravida  8   Para  5   Term  2   Preterm  3   AB  3   Living  5      SAB  3   IAB  0   Ectopic  0   Multiple  0   Live Births  5           Family History  Problem Relation Age of Onset   Hypertension Mother    Hypertension Father    Diabetes Paternal Grandmother    Cancer Paternal Grandfather    Seizures Paternal Grandfather    Breast cancer Maternal  Aunt     Social History   Tobacco Use   Smoking status: Never   Smokeless tobacco: Never  Vaping Use   Vaping Use: Never used  Substance Use Topics   Alcohol use: Not Currently    Comment: last drink 2 yrs ago   Drug use: No    Home Medications Prior to Admission medications   Medication Sig Start Date End Date Taking? Authorizing Provider  albuterol (VENTOLIN HFA) 108 (90 Base) MCG/ACT inhaler Inhale 2 puffs into the lungs every 6 (six) hours as needed for wheezing or shortness of breath. 09/03/21   Claiborne Rigg, NP  benzonatate (TESSALON PERLES) 100 MG capsule Take 1 capsule (100 mg total) by mouth 3 (three) times daily as needed. 08/28/21   Junie Spencer, FNP  cyclobenzaprine (FLEXERIL) 10 MG tablet Take 1 tablet (10 mg total) by mouth 3 (three)  times daily as needed for muscle spasms. 08/27/21   Daphine Deutscher, Mary-Margaret, FNP  FLUoxetine (PROZAC) 10 MG capsule Take 1 capsule (10 mg total) by mouth daily. 06/13/21   Milas Hock, MD  naproxen (NAPROSYN) 500 MG tablet Take 1 tablet (500 mg total) by mouth 2 (two) times daily with a meal. 08/27/21   Daphine Deutscher, Mary-Margaret, FNP  tranexamic acid (LYSTEDA) 650 MG TABS tablet Take 2 tablets (1,300 mg total) by mouth 3 (three) times daily. Take during menses for a maximum of five days 06/13/21   Milas Hock, MD    Allergies    Penicillins  Review of Systems   Review of Systems  Constitutional:  Negative for activity change, chills, diaphoresis and fever.  Respiratory:  Positive for cough and wheezing. Negative for shortness of breath.   Cardiovascular:  Positive for chest pain. Negative for palpitations.  Gastrointestinal:  Negative for abdominal pain, nausea and vomiting.  Neurological:  Negative for light-headedness and headaches.  All other systems reviewed and are negative.  Physical Exam Updated Vital Signs BP 131/70   Pulse 84   Temp 98.3 F (36.8 C) (Oral)   Resp 16   Ht 5\' 6"  (1.676 m)   Wt 112.9 kg   LMP 08/31/2021 (Exact Date)   SpO2 99%   BMI 40.19 kg/m   Physical Exam Vitals and nursing note reviewed.  Constitutional:      General: She is not in acute distress.    Appearance: Normal appearance. She is not ill-appearing.  HENT:     Head: Normocephalic and atraumatic.     Nose: Nose normal.  Eyes:     General: No scleral icterus.    Extraocular Movements: Extraocular movements intact.     Conjunctiva/sclera: Conjunctivae normal.  Cardiovascular:     Rate and Rhythm: Normal rate and regular rhythm.     Pulses: Normal pulses.     Heart sounds: Normal heart sounds.  Pulmonary:     Effort: Pulmonary effort is normal. No respiratory distress.     Breath sounds: Normal breath sounds. No wheezing or rales.  Abdominal:     General: There is no distension.      Tenderness: There is no abdominal tenderness.  Musculoskeletal:        General: Normal range of motion.     Cervical back: Normal range of motion.     Right lower leg: No edema.     Left lower leg: No edema.  Skin:    General: Skin is warm and dry.  Neurological:     General: No focal deficit present.  Mental Status: She is alert. Mental status is at baseline.    ED Results / Procedures / Treatments   Labs (all labs ordered are listed, but only abnormal results are displayed) Labs Reviewed  RESP PANEL BY RT-PCR (FLU A&B, COVID) ARPGX2  CBC  BASIC METABOLIC PANEL  TROPONIN I (HIGH SENSITIVITY)    EKG None  Radiology DG Chest 2 View  Result Date: 09/12/2021 CLINICAL DATA:  chest pain and productive cough EXAM: CHEST - 2 VIEW COMPARISON:  Chest x-ray 09/28/2012 FINDINGS: The heart and mediastinal contours are within normal limits. No focal consolidation. No pulmonary edema. No pleural effusion. No pneumothorax. No acute osseous abnormality. IMPRESSION: No active cardiopulmonary disease. Electronically Signed   By: Tish Frederickson M.D.   On: 09/12/2021 16:30    Procedures Procedures   Medications Ordered in ED Medications  sodium chloride 0.9 % bolus 1,000 mL (1,000 mLs Intravenous New Bag/Given 09/12/21 1621)    ED Course  I have reviewed the triage vital signs and the nursing notes.  Pertinent labs & imaging results that were available during my care of the patient were reviewed by me and considered in my medical decision making (see chart for details).    MDM Rules/Calculators/A&P                           25 year old female with couple month duration of cough and wheezing has had multiple ED visits presents today for evaluation of new onset of productive cough and chest pain onset this morning.  Has been treated with prednisone and albuterol without improvement in her symptoms.  Will obtain CBC, BMP, chest x-ray, EKG, and troponin to evaluate for ACS or other  cause of chest pain as well as pneumonia given her new productive cough.   Cardiac work-up with troponin, chest x-ray, EKG unremarkable.  Given patient's chest pain has been ongoing since 8 AM 1 troponin in her case is a physician.  Her chest pain is nonexertional.  Potentially related to her ongoing coughing spells.  Her chest x-ray is also without pneumonia.  Unsure of her etiology of her cough but given that she does have intermittent wheezing with these coughing spells it could be undiagnosed asthma.  Discussed importance of establishing care with primary care provider for good follow-up and work-up.  Patient voices understanding and is in agreement with plan.  Return precautions discussed.  Final Clinical Impression(s) / ED Diagnoses Final diagnoses:  None    Rx / DC Orders ED Discharge Orders     None        Marita [place], PA-C 09/12/21 1803    Rolan Bucco, MD 09/12/21 1944

## 2021-09-13 ENCOUNTER — Telehealth: Payer: Medicaid Other | Admitting: Family

## 2021-09-13 ENCOUNTER — Telehealth: Payer: Medicaid Other | Admitting: Family Medicine

## 2021-09-13 DIAGNOSIS — R051 Acute cough: Secondary | ICD-10-CM

## 2021-09-13 DIAGNOSIS — R052 Subacute cough: Secondary | ICD-10-CM

## 2021-09-13 MED ORDER — CETIRIZINE HCL 10 MG PO TABS: 10.0000 mg | ORAL_TABLET | Freq: Every day | ORAL | 11 refills | Status: DC

## 2021-09-13 MED ORDER — FLUTICASONE PROPIONATE 50 MCG/ACT NA SUSP: 2.0000 | Freq: Every day | NASAL | 6 refills | Status: DC

## 2021-09-13 NOTE — Progress Notes (Signed)
Virtual Visit Consent   [facility], you are scheduled for a virtual visit with a [facility] provider today.     Just as with appointments in the office, your consent must be obtained to participate.  Your consent will be active for this visit and any virtual visit you may have with one of our providers in the next 365 days.     If you have a MyChart account, a copy of this consent can be sent to you electronically.  All virtual visits are billed to your insurance company just like a traditional visit in the office.    As this is a virtual visit, video technology does not allow for your provider to perform a traditional examination.  This may limit your provider's ability to fully assess your condition.  If your provider identifies any concerns that need to be evaluated in person or the need to arrange testing (such as labs, EKG, etc.), we will make arrangements to do so.     Although advances in technology are sophisticated, we cannot ensure that it will always work on either your end or our end.  If the connection with a video visit is poor, the visit may have to be switched to a telephone visit.  With either a video or telephone visit, we are not always able to ensure that we have a secure connection.     I need to obtain your verbal consent now.   Are you willing to proceed with your visit today?    Candace Caprice Mccaffrey has provided verbal consent on 09/13/2021 for a virtual visit (video or telephone).   Jannifer Rodney, FNP   Date: 09/13/2021 6:16 PM   Virtual Visit via Video Note   I, Jannifer Rodney, connected with  Kem Parcher  ([id number], 1996/07/14) on 09/13/21 at  6:15 PM EST by a video-enabled telemedicine application and verified that I am speaking with the correct person using two identifiers.  Location: Patient: Virtual Visit Location Patient: Home Provider: Virtual Visit Location Provider: Home Office   I discussed the limitations of evaluation and  management by telemedicine and the availability of in person appointments. The patient expressed understanding and agreed to proceed.    History of Present Illness: [name] is a 25 y.o. who identifies as a female who was assigned female at birth, and is being seen today for cough. She went to the ED yesterday and had a negative chest x-ray. She was given albuterol without relief.   HPI: Cough This is a new problem. The current episode started more than 1 month ago. The problem has been waxing and waning. The cough is Non-productive. Associated symptoms include chest pain, nasal congestion, shortness of breath and wheezing. Pertinent negatives include no chills, ear congestion, ear pain, fever or sore throat. The symptoms are aggravated by lying down. She has tried rest (albuterol and tessalon) for the symptoms. The treatment provided mild relief.   Problems:  Patient Active Problem List   Diagnosis Date Noted   Gestational hypertension 09/02/2020   Elevated liver enzymes 06/09/2020   BMI 37.0-37.9, adult 05/04/2020   History of gestational hypertension 04/21/2020   History of preterm delivery 04/06/2020    Allergies:  Allergies  Allergen Reactions   Penicillins Anaphylaxis, Itching, Swelling, Rash and Other (See Comments)    Has patient had a PCN reaction causing immediate rash, facial/tongue/throat swelling, SOB or lightheadedness with hypotension: Yes Has patient had a PCN reaction causing severe rash involving mucus  membranes or skin necrosis: No Has patient had a PCN reaction that required hospitalization: No Has patient had a PCN reaction occurring within the last 10 years: No If all of the above answers are "NO", then may proceed with Cephalosporin use.  And swelling   Medications:  Current Outpatient Medications:    cetirizine (ZYRTEC) 10 MG tablet, Take 1 tablet (10 mg total) by mouth daily., Disp: 30 tablet, Rfl: 11   fluticasone (FLONASE) 50 MCG/ACT nasal spray,  Place 2 sprays into both nostrils daily., Disp: 16 g, Rfl: 6   albuterol (VENTOLIN HFA) 108 (90 Base) MCG/ACT inhaler, Inhale 2 puffs into the lungs every 6 (six) hours as needed for wheezing or shortness of breath., Disp: 18 g, Rfl: 0   benzonatate (TESSALON) 100 MG capsule, Take 1 capsule (100 mg total) by mouth every 8 (eight) hours., Disp: 21 capsule, Rfl: 0   cyclobenzaprine (FLEXERIL) 10 MG tablet, Take 1 tablet (10 mg total) by mouth 3 (three) times daily as needed for muscle spasms., Disp: 30 tablet, Rfl: 0   FLUoxetine (PROZAC) 10 MG capsule, Take 1 capsule (10 mg total) by mouth daily., Disp: 30 capsule, Rfl: 3   naproxen (NAPROSYN) 500 MG tablet, Take 1 tablet (500 mg total) by mouth 2 (two) times daily with a meal., Disp: 30 tablet, Rfl: 0   tranexamic acid (LYSTEDA) 650 MG TABS tablet, Take 2 tablets (1,300 mg total) by mouth 3 (three) times daily. Take during menses for a maximum of five days, Disp: 30 tablet, Rfl: 2  Observations/Objective: Patient is well-developed, well-nourished in no acute distress.  Resting comfortably  at home.  Head is normocephalic, atraumatic.  No labored breathing.  Speech is clear and coherent with logical content.  Patient is alert and oriented at baseline.    Assessment and Plan: 1. Acute cough - cetirizine (ZYRTEC) 10 MG tablet; Take 1 tablet (10 mg total) by mouth daily.  Dispense: 30 tablet; Refill: 11 - fluticasone (FLONASE) 50 MCG/ACT nasal spray; Place 2 sprays into both nostrils daily.  Dispense: 16 g; Refill: 6 Start zyrtec and flonase  Continue Albuterol as needed Continue tessalon  Keep follow up appt with PCP, if symptoms continue may need to see specialists  Follow Up Instructions: I discussed the assessment and treatment plan with the patient. The patient was provided an opportunity to ask questions and all were answered. The patient agreed with the plan and demonstrated an understanding of the instructions.  A copy of instructions  were sent to the patient via MyChart unless otherwise noted below.     The patient was advised to call back or seek an in-person evaluation if the symptoms worsen or if the condition fails to improve as anticipated.  Time:  I spent 10 minutes with the patient via telehealth technology discussing the above problems/concerns.    Jannifer Rodney, FNP

## 2021-09-13 NOTE — Progress Notes (Signed)
Several visit over the last week- reports ED visit yesterday and is now coughing so much she is vomiting- reports inhaler not helping.  Recommend in person eval for best practice.

## 2021-10-03 ENCOUNTER — Telehealth: Payer: Medicaid Other | Admitting: Physician Assistant

## 2021-10-03 DIAGNOSIS — B9689 Other specified bacterial agents as the cause of diseases classified elsewhere: Secondary | ICD-10-CM

## 2021-10-03 DIAGNOSIS — J019 Acute sinusitis, unspecified: Secondary | ICD-10-CM

## 2021-10-04 MED ORDER — BENZONATATE 100 MG PO CAPS: 100.0000 mg | ORAL_CAPSULE | Freq: Three times a day (TID) | ORAL | 0 refills | Status: DC | PRN

## 2021-10-04 MED ORDER — DOXYCYCLINE HYCLATE 100 MG PO TABS: 100.0000 mg | ORAL_TABLET | Freq: Two times a day (BID) | ORAL | 0 refills | Status: DC

## 2021-10-04 NOTE — Progress Notes (Signed)
I have spent 5 minutes in review of e-visit questionnaire, review and updating patient chart, medical decision making and response to patient.   Nikita Surman Cody Zamoria Boss, PA-C    

## 2021-10-04 NOTE — Progress Notes (Signed)
E-Visit for Sinus Problems  We are sorry that you are not feeling well.  Here is how we plan to help!  Based on what you have shared with me it looks like you have sinusitis.  Sinusitis is inflammation and infection in the sinus cavities of the head.  Based on your presentation I believe you most likely have Acute Bacterial Sinusitis.  This is an infection caused by bacteria and is treated with antibiotics. I have prescribed Doxycycline 100mg  by mouth twice a day for 10 days. You may use an oral decongestant such as Mucinex D or if you have glaucoma or high blood pressure use plain Mucinex. Saline nasal spray help and can safely be used as often as needed for congestion.  If you develop worsening sinus pain, fever or notice severe headache and vision changes, or if symptoms are not better after completion of antibiotic, please schedule an appointment with a health care provider.  I have also sent in a prescription cough medication, Tessalon, for you to use as directed as well.   Sinus infections are not as easily transmitted as other respiratory infection, however we still recommend that you avoid close contact with loved ones, especially the very young and elderly.  Remember to wash your hands thoroughly throughout the day as this is the number one way to prevent the spread of infection!  Home Care: Only take medications as instructed by your medical team. Complete the entire course of an antibiotic. Do not take these medications with alcohol. A steam or ultrasonic humidifier can help congestion.  You can place a towel over your head and breathe in the steam from hot water coming from a faucet. Avoid close contacts especially the very young and the elderly. Cover your mouth when you cough or sneeze. Always remember to wash your hands.  Get Help Right Away If: You develop worsening fever or sinus pain. You develop a severe head ache or visual changes. Your symptoms persist after you have  completed your treatment plan.  Make sure you Understand these instructions. Will watch your condition. Will get help right away if you are not doing well or get worse.  Thank you for choosing an e-visit.  Your e-visit answers were reviewed by a board certified advanced clinical practitioner to complete your personal care plan. Depending upon the condition, your plan could have included both over the counter or prescription medications.  Please review your pharmacy choice. Make sure the pharmacy is open so you can pick up prescription now. If there is a problem, you may contact your provider through and have the prescription routed to another pharmacy.  Your safety is important to [employer]. If you have drug allergies check your prescription carefully.   For the next 24 hours you can use MyChart to ask questions about today's visit, request a non-urgent call back, or ask for a work or school excuse. You will get an email in the next two days asking about your experience. I hope that your e-visit has been valuable and will speed your recovery.

## 2021-11-03 ENCOUNTER — Telehealth: Payer: Medicaid Other | Admitting: Physician Assistant

## 2021-11-03 DIAGNOSIS — R053 Chronic cough: Secondary | ICD-10-CM

## 2021-11-03 NOTE — Progress Notes (Signed)
Based on what you shared with me, I feel your condition warrants further evaluation and I recommend that you be seen in a face to face visit.  For a chronic cough like this you need to be evaluated in person. We are unable to prescribe Nebulizers. It would require a face to face evaluation for that. Also, you may benefit by being referred to a Pulmonologist (lung doctor) for further evaluation. We are unable to place referrals through the virtual department as well. Please seek in person (face to face) care.    NOTE: There will be NO CHARGE for this eVisit   If you are having a true medical emergency please call 911.      For an urgent face to face visit, [facility] has six urgent care centers for your convenience:     [facility] Health Urgent Care Center at [facility] Directions [phone] [address] [place], [place] [address]    [facility] Health Urgent Care Center [facility]) Get Driving Directions [phone] [address] [place], [place] [address]  [facility] Health Urgent Care Center [facility] [place]) Get Driving Directions [phone] [address] [place],  [place]  [address]  [facility] Health Urgent Care at [facility] Get Driving Directions [phone] [address] [place], [place] [address]   [facility] Health Urgent Care at [facility] Get Driving Directions  [phone] [address] [place], [place] [address]   [facility] Health Urgent Care at [facility] Directions [phone] [address] [place], [place] [address]  Your MyChart E-visit questionnaire answers were reviewed by a board certified advanced clinical practitioner to complete your personal care plan based on your specific symptoms.  Thank you for using e-Visits.   I provided 5 minutes of non face-to-face time during this encounter for chart review and documentation.

## 2021-11-06 ENCOUNTER — Telehealth: Payer: Medicaid Other | Admitting: Nurse Practitioner

## 2021-11-06 DIAGNOSIS — R197 Diarrhea, unspecified: Secondary | ICD-10-CM | POA: Diagnosis not present

## 2021-11-06 NOTE — Progress Notes (Signed)
We are sorry that you are not feeling well.  Here is how we plan to help!  Based on what you have shared with me it looks like you have Acute Infectious Diarrhea.  Most cases of acute diarrhea are due to infections with virus and bacteria and are self-limited conditions lasting less than 14 days.  For your symptoms you may take Imodium 2 mg tablets that are over the counter at your local pharmacy. Take two tablet now and then one after each loose stool up to 6 a day.  Antibiotics are not needed for most people with diarrhea.   HOME CARE We recommend changing your diet to help with your symptoms for the next few days. Drink plenty of fluids that contain water salt and sugar. Sports drinks such as Gatorade may help.  You may try broths, soups, bananas, applesauce, soft breads, mashed potatoes or crackers.  You are considered infectious for as long as the diarrhea continues. Hand washing or use of alcohol based hand sanitizers is recommend. It is best to stay out of work or school until your symptoms stop.   GET HELP RIGHT AWAY If you have dark yellow colored urine or do not pass urine frequently you should drink more fluids.   If your symptoms worsen  If you feel like you are going to pass out (faint) You have a new problem  MAKE SURE YOU  Understand these instructions. Will watch your condition. Will get help right away if you are not doing well or get worse.  Thank you for choosing an e-visit.  Your e-visit answers were reviewed by a board certified advanced clinical practitioner to complete your personal care plan. Depending upon the condition, your plan could have included both over the counter or prescription medications.  Please review your pharmacy choice. Make sure the pharmacy is open so you can pick up prescription now. If there is a problem, you may contact your provider through MyChart messaging and have the prescription routed to another pharmacy.  Your safety is important  to us. If you have drug allergies check your prescription carefully.   For the next 24 hours you can use MyChart to ask questions about today's visit, request a non-urgent call back, or ask for a work or school excuse. You will get an email in the next two days asking about your experience. I hope that your e-visit has been valuable and will speed your recovery.  

## 2021-11-22 ENCOUNTER — Ambulatory Visit: Payer: Medicaid Other | Admitting: Nurse Practitioner

## 2021-11-22 NOTE — Progress Notes (Deleted)
I,Dequante Tremaine T Eragon Hammond,acting as a Neurosurgeon for Arnette Felts, FNP.,have documented all relevant documentation on the behalf of Arnette Felts, FNP,as directed by  Arnette Felts, FNP while in the presence of Arnette Felts, FNP.  This visit occurred during the SARS-CoV-2 public health emergency.  Safety protocols were in place, including screening questions prior to the visit, additional usage of staff PPE, and extensive cleaning of exam room while observing appropriate contact time as indicated for disinfecting solutions.  Subjective:     Patient ID: [name] , female    DOB: 06/18/1996 , 26 y.o.   MRN: [id number]   No chief complaint on file.   HPI  HPI   Past Medical History:  Diagnosis Date   Anxiety    Chlamydia 2018, 2021   Chronic kidney disease    kidney stones and kidney infection with last pregnancy   Depression    "gets in her moods, currently ok'   Gonorrhea 2018   Headache    Incisional hernia 04/2013   Infection    UTI   LGSIL on Pap smear of cervix 2019   Ovarian cyst    Urethral diverticulum      Family History  Problem Relation Age of Onset   Hypertension Mother    Hypertension Father    Diabetes Paternal Grandmother    Cancer Paternal Grandfather    Seizures Paternal Grandfather    Breast cancer Maternal Aunt      Current Outpatient Medications:    albuterol (VENTOLIN HFA) 108 (90 Base) MCG/ACT inhaler, Inhale 2 puffs into the lungs every 6 (six) hours as needed for wheezing or shortness of breath., Disp: 18 g, Rfl: 0   benzonatate (TESSALON) 100 MG capsule, Take 1 capsule (100 mg total) by mouth 3 (three) times daily as needed for cough., Disp: 30 capsule, Rfl: 0   cetirizine (ZYRTEC) 10 MG tablet, Take 1 tablet (10 mg total) by mouth daily., Disp: 30 tablet, Rfl: 11   doxycycline (VIBRA-TABS) 100 MG tablet, Take 1 tablet (100 mg total) by mouth 2 (two) times daily., Disp: 20 tablet, Rfl: 0   FLUoxetine (PROZAC) 10 MG capsule, Take 1 capsule (10  mg total) by mouth daily., Disp: 30 capsule, Rfl: 3   tranexamic acid (LYSTEDA) 650 MG TABS tablet, Take 2 tablets (1,300 mg total) by mouth 3 (three) times daily. Take during menses for a maximum of five days, Disp: 30 tablet, Rfl: 2   Allergies  Allergen Reactions   Penicillins Anaphylaxis, Itching, Swelling, Rash and Other (See Comments)    Has patient had a PCN reaction causing immediate rash, facial/tongue/throat swelling, SOB or lightheadedness with hypotension: Yes Has patient had a PCN reaction causing severe rash involving mucus membranes or skin necrosis: No Has patient had a PCN reaction that required hospitalization: No Has patient had a PCN reaction occurring within the last 10 years: No If all of the above answers are "NO", then may proceed with Cephalosporin use.  And swelling     Review of Systems   There were no vitals filed for this visit. There is no height or weight on file to calculate BMI.   Objective:  Physical Exam      Assessment And Plan:     There are no diagnoses linked to this encounter.    Patient was given opportunity to ask questions. Patient verbalized understanding of the plan and was able to repeat key elements of the plan. All questions were answered to their satisfaction.  [name]  Jolene Schimke, CMA   I, Coolidge Breeze, CMA, have reviewed all documentation for this visit. The documentation on 11/22/21 for the exam, diagnosis, procedures, and orders are all accurate and complete.   IF YOU HAVE BEEN REFERRED TO A SPECIALIST, IT MAY TAKE 1-2 WEEKS TO SCHEDULE/PROCESS THE REFERRAL. IF YOU HAVE NOT HEARD FROM US/SPECIALIST IN TWO WEEKS, PLEASE GIVE [place] A CALL AT [phone].   THE PATIENT IS ENCOURAGED TO PRACTICE SOCIAL DISTANCING DUE TO THE COVID-19 PANDEMIC.

## 2021-11-23 ENCOUNTER — Institutional Professional Consult (permissible substitution): Payer: Medicaid Other | Admitting: Pulmonary Disease

## 2021-12-14 ENCOUNTER — Other Ambulatory Visit: Payer: Self-pay

## 2021-12-14 ENCOUNTER — Ambulatory Visit (INDEPENDENT_AMBULATORY_CARE_PROVIDER_SITE_OTHER): Payer: Medicaid Other | Admitting: Student

## 2021-12-14 ENCOUNTER — Encounter: Payer: Self-pay | Admitting: Student

## 2021-12-14 VITALS — BP 124/74 | HR 101 | Temp 98.3°F | Ht 66.0 in | Wt 261.0 lb

## 2021-12-14 DIAGNOSIS — R053 Chronic cough: Secondary | ICD-10-CM | POA: Diagnosis not present

## 2021-12-14 MED ORDER — OMEPRAZOLE 20 MG PO CPDR: 40.0000 mg | DELAYED_RELEASE_CAPSULE | Freq: Every day | ORAL | 2 refills | Status: DC

## 2021-12-14 NOTE — Progress Notes (Signed)
Synopsis: Referred for chronic cough by No ref. provider found  Subjective:   PATIENT ID: [place] GENDER: female DOB: 01-Sep-1996, MRN: [id number]  Chief Complaint  Patient presents with   Pulmonary Consult    Self referral- c/o cough for at least the past 6 months. She has noticed cough is worse at night, when she gets too hot and with exertion. She states pred has been tried and did not help. Cough is mainly non prod- feels like mucus gets stuck in her throat. She gets winded walking approx 30 ft.    26yF with history of CKD, anxiety, obesity  She never did have covid-19 infection, not vaccinated for it though.   She has never had diagnosis of asthma. She has tried albuterol but doesn't find that it relieves cough, dyspnea.  Cough for last 6 months. Dyspneic with exertion. Over last 6 months. She has a little bit of PND. She has only a little bit of acid reflux sensation.   Otherwise pertinent review of systems is negative.  Grandfather had lung cancer  She is not working currently. She has worked in Pharmacologist, was Conservation officer, nature at [employer]. She has never smoked or vaped. She does not have pet at home.   Past Medical History:  Diagnosis Date   Anxiety    Chlamydia 2018, 2021   Chronic kidney disease    kidney stones and kidney infection with last pregnancy   Depression    "gets in her moods, currently ok'   Gonorrhea 2018   Headache    Incisional hernia 04/2013   Infection    UTI   LGSIL on Pap smear of cervix 2019   Ovarian cyst    Urethral diverticulum      Family History  Problem Relation Age of Onset   Hypertension Mother    Hypertension Father    Diabetes Paternal Grandmother    Lung cancer Paternal Grandfather        smoked   Cancer Paternal Grandfather    Seizures Paternal Grandfather    Breast cancer Maternal Aunt      Past Surgical History:  Procedure Laterality Date   CHOLECYSTECTOMY  04/03/2012   Procedure: LAPAROSCOPIC CHOLECYSTECTOMY  WITH INTRAOPERATIVE CHOLANGIOGRAM;  Surgeon: Velora Heckler, MD;  Location: [facility];  Service: General;  Laterality: N/A;   ERCP  03/12/2012   Procedure: ENDOSCOPIC RETROGRADE CHOLANGIOPANCREATOGRAPHY (ERCP);  Surgeon: Petra Kuba, MD;  Location: [facility] OR;  Service: Endoscopy;  Laterality: N/A;   INGUINAL HERNIA REPAIR N/A 05/15/2013   Procedure: HERNIA REPAIR INCISIONAL;  Surgeon: Velora Heckler, MD;  Location: [facility];  Service: General;  Laterality: N/A;   INSERTION OF MESH N/A 05/15/2013   Procedure: INSERTION OF MESH;  Surgeon: Velora Heckler, MD;  Location: [facility];  Service: General;  Laterality: N/A;   URETERAL STENT PLACEMENT     WISDOM TOOTH EXTRACTION      Social History   Socioeconomic History   Marital status: Married    Spouse name: Not on file   Number of children: Not on file   Years of education: Not on file   Highest education level: Not on file  Occupational History   Not on file  Tobacco Use   Smoking status: Never   Smokeless tobacco: Never  Vaping Use   Vaping Use: Never used  Substance and Sexual Activity   Alcohol use: Not Currently    Comment: last drink 2 yrs ago  Drug use: No   Sexual activity: Yes    Birth control/protection: None    Comment: female partner  Other Topics Concern   Not on file  Social History Narrative   Not on file   Social Determinants of Health   Financial Resource Strain: Not on file  Food Insecurity: Not on file  Transportation Needs: Not on file  Physical Activity: Not on file  Stress: Not on file  Social Connections: Not on file  Intimate Partner Violence: Not on file     Allergies  Allergen Reactions   Penicillins Anaphylaxis, Itching, Swelling, Rash and Other (See Comments)    Has patient had a PCN reaction causing immediate rash, facial/tongue/throat swelling, SOB or lightheadedness with hypotension: Yes Has patient had a PCN reaction causing severe rash involving mucus membranes or  skin necrosis: No Has patient had a PCN reaction that required hospitalization: No Has patient had a PCN reaction occurring within the last 10 years: No If all of the above answers are "NO", then may proceed with Cephalosporin use.  And swelling     Outpatient Medications Prior to Visit  Medication Sig Dispense Refill   albuterol (VENTOLIN HFA) 108 (90 Base) MCG/ACT inhaler Inhale 2 puffs into the lungs every 6 (six) hours as needed for wheezing or shortness of breath. 18 g 0   FLUoxetine (PROZAC) 10 MG capsule Take 1 capsule (10 mg total) by mouth daily. (Patient not taking: Reported on 12/14/2021) 30 capsule 3   benzonatate (TESSALON) 100 MG capsule Take 1 capsule (100 mg total) by mouth 3 (three) times daily as needed for cough. 30 capsule 0   cetirizine (ZYRTEC) 10 MG tablet Take 1 tablet (10 mg total) by mouth daily. 30 tablet 11   doxycycline (VIBRA-TABS) 100 MG tablet Take 1 tablet (100 mg total) by mouth 2 (two) times daily. 20 tablet 0   tranexamic acid (LYSTEDA) 650 MG TABS tablet Take 2 tablets (1,300 mg total) by mouth 3 (three) times daily. Take during menses for a maximum of five days 30 tablet 2   No facility-administered medications prior to visit.       Objective:   Physical Exam:  General appearance: 26 y.o., female, NAD, conversant  Eyes: anicteric sclerae; PERRL, tracking appropriately HENT: NCAT; MMM Neck: Trachea midline; no lymphadenopathy, no JVD Lungs: CTAB, no crackles, no wheeze, with normal respiratory effort CV: tachy, RR, no murmur  Abdomen: Soft, non-tender; non-distended, BS present  Extremities: No peripheral edema, warm Skin: Normal turgor and texture; no rash Psych: Appropriate affect Neuro: Alert and oriented to person and place, no focal deficit     Vitals:   12/14/21 1531  BP: 124/74  Pulse: (!) 101  Temp: 98.3 F (36.8 C)  TempSrc: Oral  SpO2: 96%  Weight: 261 lb (118.4 kg)  Height: 5\' 6"  (1.676 m)   96% on RA BMI Readings from  Last 3 Encounters:  12/14/21 42.13 kg/m  09/12/21 40.19 kg/m  06/13/21 42.02 kg/m   Wt Readings from Last 3 Encounters:  12/14/21 261 lb (118.4 kg)  09/12/21 249 lb (112.9 kg)  06/13/21 244 lb 12.8 oz (111 kg)     CBC    Component Value Date/Time   WBC 6.0 09/12/2021 1622   RBC 4.48 09/12/2021 1622   HGB 14.1 09/12/2021 1622   HGB 13.6 06/13/2021 1402   HGB 13.8 08/21/2011 0000   HCT 41.5 09/12/2021 1622   HCT 39.9 06/13/2021 1402   HCT 39 08/21/2011 0000  PLT 245 09/12/2021 1622   PLT 285 06/13/2021 1402   PLT 273 08/21/2011 0000   MCV 92.6 09/12/2021 1622   MCV 92 06/13/2021 1402   MCH 31.5 09/12/2021 1622   MCHC 34.0 09/12/2021 1622   RDW 12.5 09/12/2021 1622   RDW 12.5 06/13/2021 1402   LYMPHSABS 2.3 04/14/2021 2304   MONOABS 0.5 04/14/2021 2304   EOSABS 0.0 04/14/2021 2304   BASOSABS 0.0 04/14/2021 2304    Eos 0-100  Chest Imaging: CXR 09/12/21 reviewed by me unremarkable  Pulmonary Functions Testing Results: No flowsheet data found.      Assessment & Plan:   # Chronic cough Unclear etiology. Intermittent reflux and only a little postnasal drainage.   Plan: - We decided to give acid reflux control an 8 week trial first. She'll start omeprazole 40 mg 30 min before dinner nightly - albuterol prn - PFT on same day as next appointment in 6 weeks or so     Omar Person, MD [facility] 12/14/2021 3:49 PM

## 2021-12-14 NOTE — Patient Instructions (Signed)
-   Start taking omeprazole 40 mg 30 minutes before dinner - PFT (breathing tests) in 6 weeks on same day as next appointment - try to avoid albuterol inhaler for 2 days before your PFT - see you in 6 weeks!

## 2021-12-26 IMAGING — US US MFM OB FOLLOW-UP
1 series · 14 of 28 positions shown · non-contrast
Comparison: none

[Series 1: us mfm ob follow-up · 45 acquisitions, 14 frames shown]
[im 2/45]
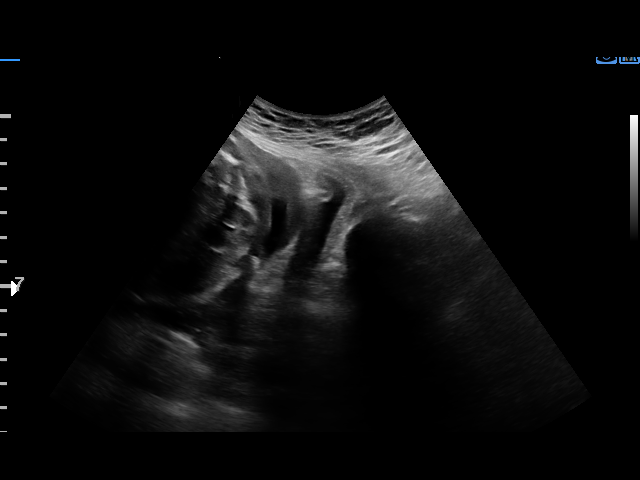
[im 5/45]
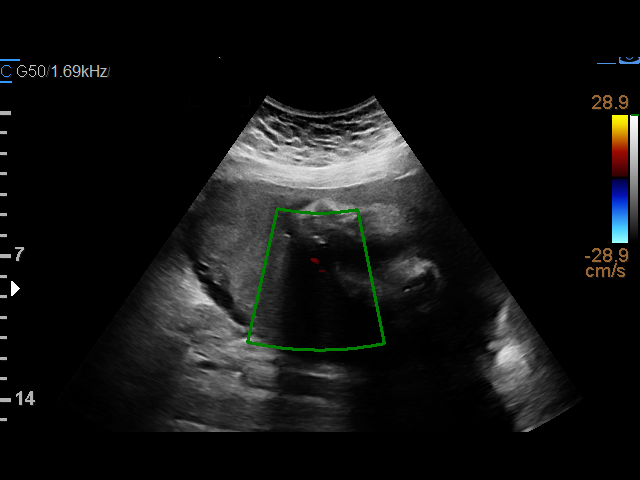
[im 9/45]
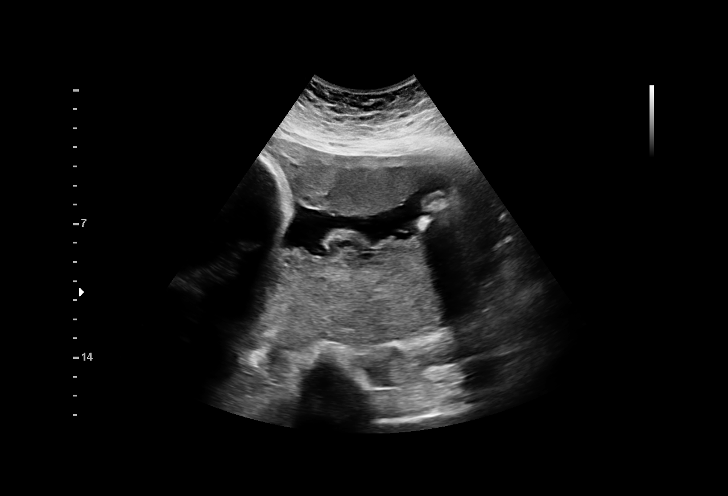
[im 12/45]
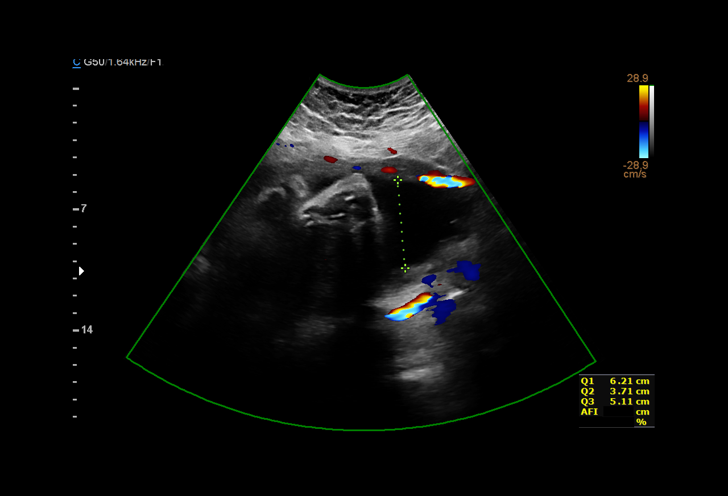
[im 15/45]
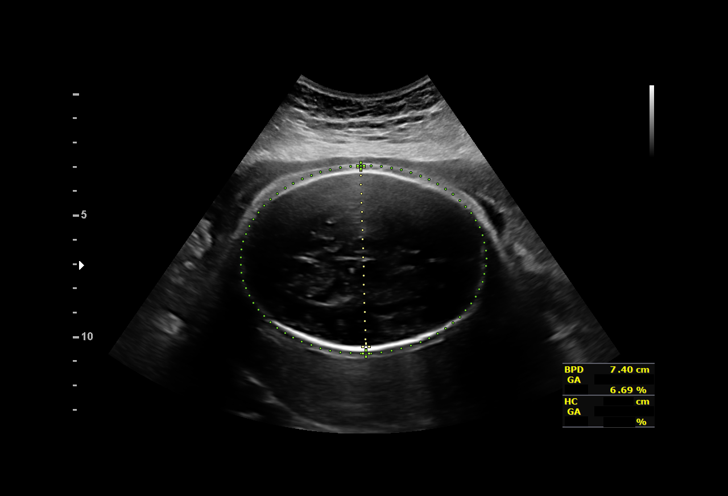
[im 18/45]
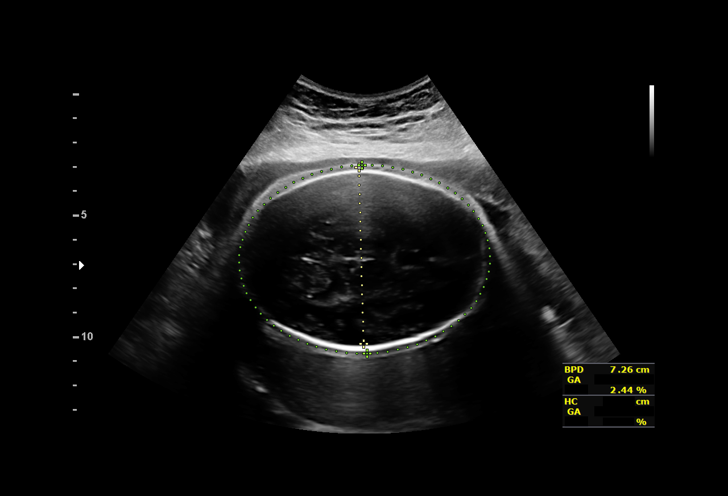
[im 22/45]
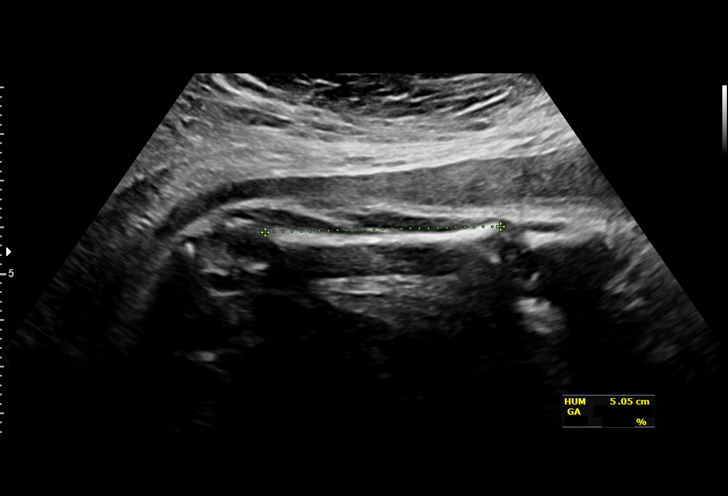
[im 25/45]
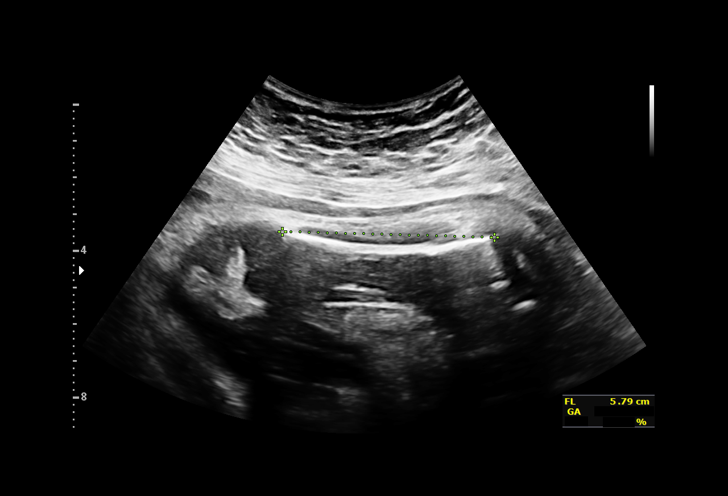
[im 28/45]
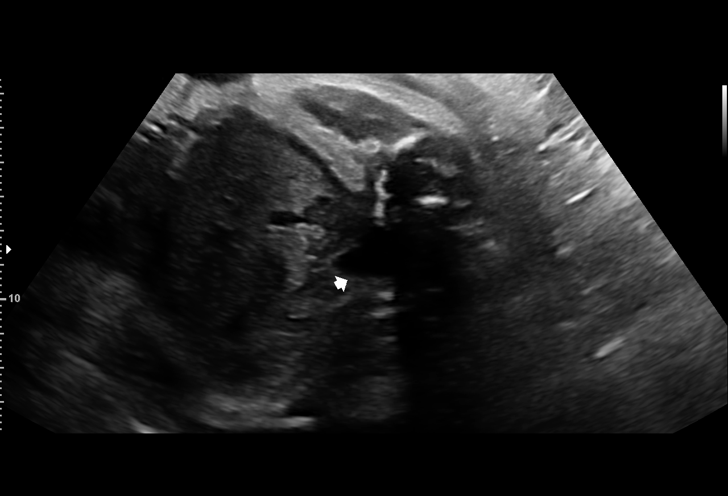
[im 31/45]
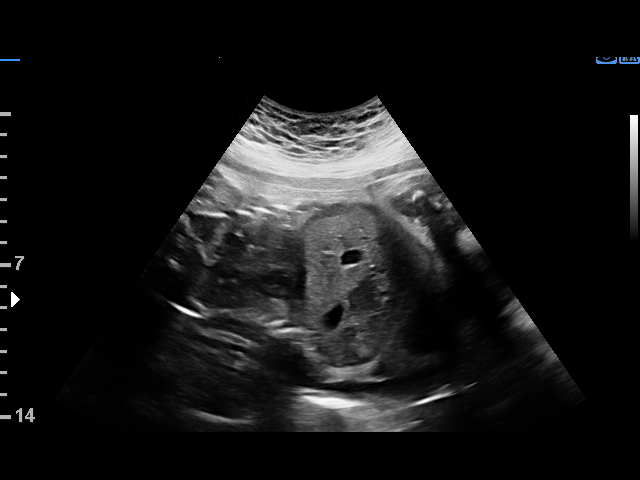
[im 35/45]
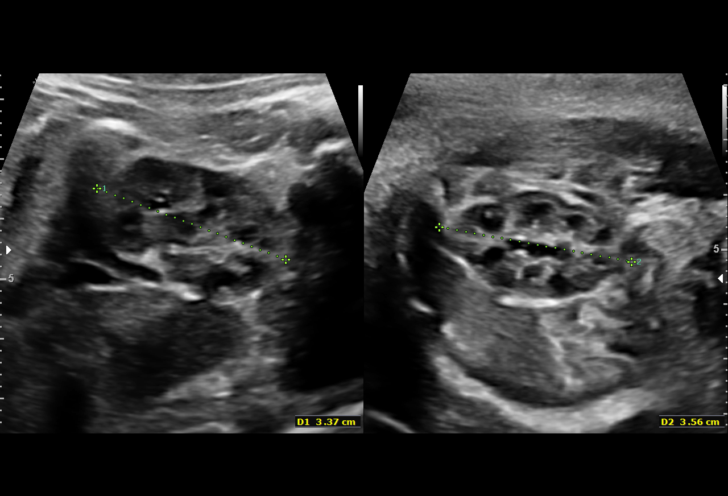
[im 38/45]
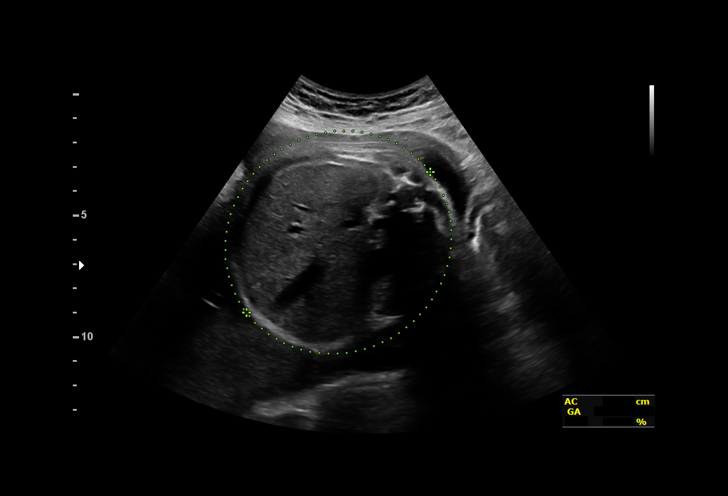
[im 41/45]
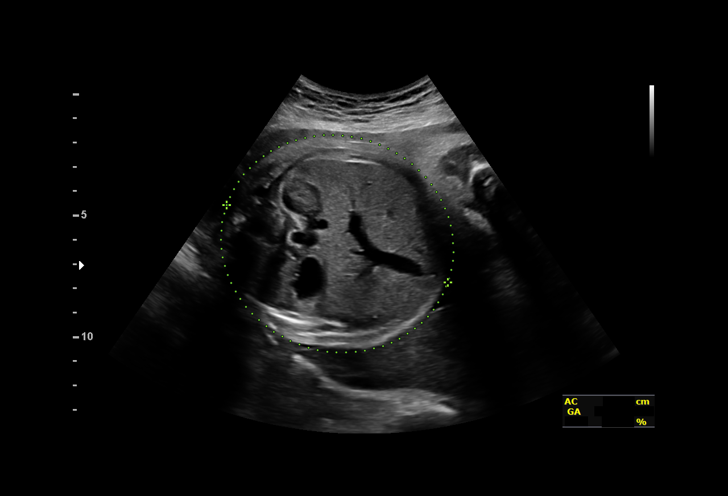
[im 45/45]
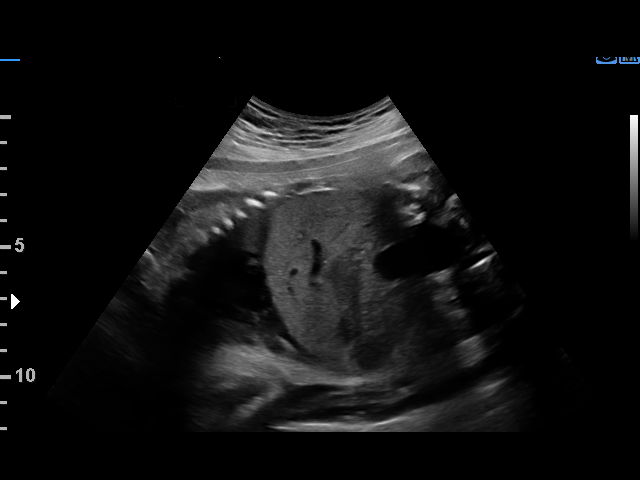

[14 of 28 positions shown; findings below may reference images not displayed]

Indications

 Obesity complicating pregnancy, second
 trimester (pregravid BMI 41)
 Poor obstetric history: Previous gestational
 HTN
 Poor obstetric history: Previous preterm
 delivery, antepartum x 3 (all 36 weeks)  - on
 17P
 Encounter for other antenatal screening
 follow-up
 31 weeks gestation of pregnancy
Fetal Evaluation

 Num Of Fetuses:         1
 Fetal Heart Rate(bpm):  146
 Cardiac Activity:       Observed
 Presentation:           Transverse, head to maternal right
 Placenta:               Posterior
 P. Cord Insertion:      Visualized

 Amniotic Fluid
 AFI FV:      Within normal limits

 AFI Sum(cm)     %Tile       Largest Pocket(cm)
 21.31           83

 RUQ(cm)       RLQ(cm)       LUQ(cm)        LLQ(cm)

Biometry

 BPD:      72.9  mm     G. Age:  29w 2d        2.5  %    CI:        66.29   %    70 - 86
                                                         FL/HC:      19.9   %    19.3 -
 HC:      287.1  mm     G. Age:  31w 4d         21  %    HC/AC:      1.00        0.96 -
 AC:      287.2  mm     G. Age:  32w 5d         85  %    FL/BPD:     78.5   %    71 - 87
 FL:       57.2  mm     G. Age:  30w 0d          9  %    FL/AC:      19.9   %    20 - 24
 HUM:      50.3  mm     G. Age:  29w 3d         13  %

 Est. FW:    4550  gm    3 lb 15 oz      45  %
OB History

 Gravidity:    8         Term:   1        Prem:   3        SAB:   3
 TOP:          0       Ectopic:  0        Living: 4
Gestational Age

 LMP:           54w 2d        Date:  06/29/19                 EDD:   04/04/20
 U/S Today:     30w 6d                                        EDD:   09/15/20
 Best:          31w 2d     Det. By:  Early Ultrasound         EDD:   09/12/20
                                     (02/10/20)
Anatomy

 Cranium:               Appears normal         Aortic Arch:            Previously seen
 Cavum:                 Appears normal         Ductal Arch:            Previously seen
 Ventricles:            Appears normal         Diaphragm:              Appears normal
 Choroid Plexus:        Previously seen        Stomach:                Appears normal, left
                                                                       sided
 Cerebellum:            Previously seen        Abdomen:                Appears normal
 Posterior Fossa:       Previously seen        Abdominal Wall:         Previously seen
 Nuchal Fold:           Not applicable (>20    Cord Vessels:           Previously seen
                        wks GA)
 Face:                  Orbits and profile     Kidneys:                Appear normal
                        previously seen
 Lips:                  Previously seen        Bladder:                Appears normal
 Thoracic:              Appears normal         Spine:                  Previously seen
 Heart:                 Previously seen        Upper Extremities:      Previously seen
 RVOT:                  Previously seen        Lower Extremities:      Previously seen
 LVOT:                  Previously seen

 Other:  Heels/feet and open hands/5th digits prev visualized. Nasal bone
         prev visualized.
Impression

 Maternal obesity.  Patient returned for fetal growth
 assessment.  She does not have gestational diabetes.
 Patient has discontinued progesterone injections.  She does
 not have symptoms of preterm labor.

 Amniotic fluid is normal and good fetal activity is seen .Fetal
 growth is appropriate for gestational age .
Recommendations

 -An appointment was made for her to return in 4 weeks for
 fetal growth assessment.
                 Dura, Md Aminul

## 2021-12-27 NOTE — Progress Notes (Signed)
?Subjective:  ? ? [name] - 26 y.o. female MRN [id number]  Date of birth: [birth year] ? ?HPI ? ?[name] is to establish care. ? ?Current issues and/or concerns: ?RIGHT KNEE PAIN: ?RIGHT-SIDE BACK PAIN: ?Persisting for 4 weeks. Pain 6-7/10. Feels like pins and needles both feet. Does not prefer to take pain medications. If needed will take over-the-counter Tylenol or Ibuprofen. Denies trauma/injury, heavy lifting pulling pushing and additional red flag symptoms. History of epidural during childbirth 2 years ago.Thinks may be partially related to weight. Would like referral to nutrition counseling. States she is aware that she eats over ideal calorie limit and has increased appetite. Also, considered if related to sciatic nerve or diabetes. ? ?3. HAND SWELLING: ?Bilateral. Fluctuates. Has to remove rings from hands sometimes. She is right-handed. History of carpal tunnel during pregnancy. Amount of salt and sugar in diet varies. Denies chest pain. Does have history of shortness of breath and followed by Pulmonology.  ? ?4. MIGRAINES: ?Onset varies. Frequent in nature. When occurs can last several days. Was taking Fioricet in the past and did not like because caused sleepiness. Located over generalized head. Taking Tylenol helps some, would like to try as needed medication.  ? ?5. URINARY INCONTINENCE: ?Duration 3 to 4 months. Wearing Depends. Happens with sneezing, coughing, and rest. Causing awakening from sleep. ? ?6. ANXIETY DEPRESSION: ?Requesting refills Fluoxetine.  ? ?Depression screen [facility] 2/9 12/29/2021  ?Decreased Interest 3  ?Down, Depressed, Hopeless 2  ?PHQ - 2 Score 5  ?Altered sleeping 3  ?Tired, decreased energy 3  ?Change in appetite 3  ?Feeling bad or failure about yourself  0  ?Trouble concentrating 3  ?Moving slowly or fidgety/restless 0  ?Suicidal thoughts 0  ?PHQ-9 Score 17  ?Difficult doing work/chores Very difficult  ? ? ?ROS per HPI  ? ? ?Health Maintenance:  ?Health  Maintenance Due  ?Topic Date Due  ? COVID-19 Vaccine (1) Never done  ? HPV VACCINES (1 - 2-dose series) Never done  ? ? ? ?Past Medical History: ?Patient Active Problem List  ? Diagnosis Date Noted  ? Gestational hypertension 09/02/2020  ? Elevated liver enzymes 06/09/2020  ? BMI 37.0-37.9, adult 05/04/2020  ? History of gestational hypertension 04/21/2020  ? History of preterm delivery 04/06/2020  ? ? ?Social History  ? reports that she has never smoked. She has never been exposed to tobacco smoke. She has never used smokeless tobacco. She reports that she does not currently use alcohol. She reports that she does not use drugs.  ? ?Family History  ?family history includes Breast cancer in her maternal aunt; Cancer in her paternal grandfather; Diabetes in her paternal grandmother; Hypertension in her father and mother; Lung cancer in her paternal grandfather; Seizures in her paternal grandfather.  ? ?Medications: reviewed and updated ?  ?Objective:  ? Physical Exam ?BP 119/78 (BP Location: Left Arm, Patient Position: Sitting, Cuff Size: Large)   Pulse 94   Temp 98.3 ?F (36.8 ?C)   Resp 18   Ht 5' 4.57" (1.64 m)   Wt 264 lb (119.7 kg)   SpO2 97%   Breastfeeding No   BMI 44.52 kg/m?  ? ?Physical Exam ?HENT:  ?   Head: Normocephalic and atraumatic.  ?Eyes:  ?   Extraocular Movements: Extraocular movements intact.  ?   Conjunctiva/sclera: Conjunctivae normal.  ?   Pupils: Pupils are equal, round, and reactive to light.  ?Cardiovascular:  ?   Rate and Rhythm: Normal rate and regular  rhythm.  ?   Pulses: Normal pulses.  ?   Heart sounds: Normal heart sounds.  ?Pulmonary:  ?   Effort: Pulmonary effort is normal.  ?   Breath sounds: Normal breath sounds.  ?Musculoskeletal:  ?   Cervical back: Normal range of motion and neck supple.  ?Neurological:  ?   General: No focal deficit present.  ?   Mental Status: She is alert and oriented to person, place, and time.  ?Psychiatric:     ?   Mood and Affect: Mood normal.      ?   Behavior: Behavior normal.  ? ?Results for orders placed or performed in visit on 12/29/21  ?POCT URINALYSIS DIP (CLINITEK)  ?Result Value Ref Range  ? Color, UA yellow yellow  ? Clarity, UA clear clear  ? Glucose, UA negative negative mg/dL  ? Bilirubin, UA negative negative  ? Ketones, POC UA negative negative mg/dL  ? Spec Grav, UA >=1.030 (A) 1.010 - 1.025  ? Blood, UA trace-intact (A) negative  ? pH, UA 6.0 5.0 - 8.0  ? POC PROTEIN,UA negative negative, trace  ? Urobilinogen, UA 0.2 0.2 or 1.0 E.U./dL  ? Nitrite, UA Negative Negative  ? Leukocytes, UA Small (1+) (A) Negative  ? ? ? ?Assessment & Plan:  ?1. Encounter to establish care: ?- Patient presents today to establish care.  ?- Return for annual physical examination, labs, and health maintenance. Arrive fasting meaning having no food for at least 8 hours prior to appointment. You may have only water or black coffee. Please take scheduled medications as normal. ? ?2. Acute pain of right knee: ?- Diagnostic x-ray right knee for further evaluation.  ?- Referral to Orthopedic Surgery for further evaluation and management. ?- Ambulatory referral to Orthopedic Surgery ?- DG Knee Complete 4 Views Right; Future ? ?3. Acute right-sided low back pain, unspecified whether sciatica present: ?- Diagnostic x-ray lumbar spine for further evaluation.  ?- Referral to Orthopedic Surgery for further evaluation and management. ?- Ambulatory referral to Orthopedic Surgery ?- DG Lumbar Spine Complete; Future ? ?4. Bilateral carpal tunnel syndrome: ?- Continue over-the-counter regimen.  ?- Try course of wrist splints especially at bedtime. ?- Follow-up with primary provider as scheduled.  ? ?5. Migraine without aura and without status migrainosus, not intractable: ?- Sumatriptan as prescribed. Counseled on medication adherence and adverse effects.  ?- Follow-up with primary provider in 4 weeks or sooner if needed.  ?- SUMAtriptan (IMITREX) 25 MG tablet; Take 25 mg (1 tablet  total) by mouth at the start of the headache. May repeat in 2 hours x 1 if headache persists. Max of 2 tablets/24 hours.  Dispense: 10 tablet; Refill: 0 ? ?6. Urinary incontinence, unspecified type: ?- No urinary tract infection.  ?- Cervicovaginal self-swab to screen for chlamydia, gonorrhea, trichomonas, bacterial vaginitis, and candida vaginitis. ?- Referral to Urogynecology for further evaluation and management.  ?- Cervicovaginal ancillary only ?- POCT URINALYSIS DIP (CLINITEK) ?- Ambulatory referral to Urogynecology ? ?7. Diabetes mellitus screening: ?- Hemoglobin A1c to screen for pre-diabetes/diabetes. ?- Hemoglobin A1c ? ?8. Class 3 severe obesity due to excess calories without serious comorbidity with body mass index (BMI) of 40.0 to 44.9 in adult [facility]): ?- Referral to Medical Nutrition Therapy for further evaluation and management.  ?- Amb ref to Medical Nutrition Therapy-MNT ? ?9. Anxiety and depression: ?- Patient denies thoughts of self-harm, suicidal ideations, homicidal ideations. ?- Continue Fluoxetine as prescribed. Counseled on medication adherence and adverse effects.  ?- Follow-up  with primary provider in 3 months or sooner if needed.  ?- FLUoxetine (PROZAC) 10 MG capsule; Take 1 capsule (10 mg total) by mouth daily.  Dispense: 30 capsule; Refill: 2 ? ?10. Chronic cough: ?- Keep all scheduled appointments with Leslye Peer, MD at Pulmonology.  ? ? ?Patient was given clear instructions to go to Emergency Department or return to medical center if symptoms don't improve, worsen, or new problems develop.The patient verbalized understanding. ? ?I discussed the assessment and treatment plan with the patient. The patient was provided an opportunity to ask questions and all were answered. The patient agreed with the plan and demonstrated an understanding of the instructions. ?  ?The patient was advised to call back or seek an in-person evaluation if the symptoms worsen or if the condition fails to  improve as anticipated. ? ? ? ?Durene Fruits, NP ?12/29/2021, 9:46 PM ?Primary Care at [facility]  ? ?

## 2021-12-29 ENCOUNTER — Ambulatory Visit (INDEPENDENT_AMBULATORY_CARE_PROVIDER_SITE_OTHER): Payer: Medicaid Other

## 2021-12-29 ENCOUNTER — Other Ambulatory Visit (HOSPITAL_COMMUNITY)
Admission: RE | Admit: 2021-12-29 | Discharge: 2021-12-29 | Disposition: A | Discharge status: Home / Self Care | Payer: Medicaid Other | Source: Ambulatory Visit | Attending: Family | Admitting: Family

## 2021-12-29 ENCOUNTER — Encounter: Payer: Self-pay | Admitting: Family

## 2021-12-29 ENCOUNTER — Ambulatory Visit (INDEPENDENT_AMBULATORY_CARE_PROVIDER_SITE_OTHER): Payer: Medicaid Other | Admitting: Family

## 2021-12-29 ENCOUNTER — Other Ambulatory Visit: Payer: Self-pay

## 2021-12-29 VITALS — BP 119/78 | HR 94 | Temp 98.3°F | Resp 18 | Ht 64.57 in | Wt 264.0 lb

## 2021-12-29 DIAGNOSIS — G5603 Carpal tunnel syndrome, bilateral upper limbs: Secondary | ICD-10-CM

## 2021-12-29 DIAGNOSIS — M545 Low back pain, unspecified: Secondary | ICD-10-CM | POA: Diagnosis not present

## 2021-12-29 DIAGNOSIS — Z6841 Body Mass Index (BMI) 40.0 and over, adult: Secondary | ICD-10-CM

## 2021-12-29 DIAGNOSIS — R32 Unspecified urinary incontinence: Secondary | ICD-10-CM | POA: Insufficient documentation

## 2021-12-29 DIAGNOSIS — Z131 Encounter for screening for diabetes mellitus: Secondary | ICD-10-CM

## 2021-12-29 DIAGNOSIS — M25561 Pain in right knee: Secondary | ICD-10-CM | POA: Diagnosis not present

## 2021-12-29 DIAGNOSIS — G43009 Migraine without aura, not intractable, without status migrainosus: Secondary | ICD-10-CM

## 2021-12-29 DIAGNOSIS — R053 Chronic cough: Secondary | ICD-10-CM

## 2021-12-29 DIAGNOSIS — F419 Anxiety disorder, unspecified: Secondary | ICD-10-CM

## 2021-12-29 DIAGNOSIS — F32A Depression, unspecified: Secondary | ICD-10-CM

## 2021-12-29 DIAGNOSIS — Z7689 Persons encountering health services in other specified circumstances: Secondary | ICD-10-CM

## 2021-12-29 LAB — POCT URINALYSIS DIP (CLINITEK)
Bilirubin, UA: NEGATIVE
Glucose, UA: NEGATIVE mg/dL
Ketones, POC UA: NEGATIVE mg/dL
Nitrite, UA: NEGATIVE
POC PROTEIN,UA: NEGATIVE
Spec Grav, UA: >=1.03 — AB (ref 1.010–1.025)
Urobilinogen, UA: 0.2 E.U./dL
pH, UA: 6 (ref 5.0–8.0)

## 2021-12-29 MED ORDER — FLUOXETINE HCL 10 MG PO CAPS
10.0000 mg | ORAL_CAPSULE | Freq: Every day | ORAL | 2 refills | Status: DC
Start: 2021-12-29 — End: 2022-04-11

## 2021-12-29 MED ORDER — SUMATRIPTAN SUCCINATE 25 MG PO TABS: ORAL_TABLET | ORAL | 0 refills | Status: DC

## 2021-12-29 NOTE — Progress Notes (Signed)
Lower back with disc narrowing. Continue with plan discussed in office.

## 2021-12-29 NOTE — Progress Notes (Signed)
Right knee no fracture and no dislocation. Continue with plan discussed in office.

## 2021-12-29 NOTE — Progress Notes (Signed)
Pt presents to establish care, pt has complaints of right knee pain for approx 1 month , back pain, bilateral hand edema, frequent migraine, bladder incontinence pt states she's wearing depends, increased appetite ?Needs refill on Fluoxetine  ?

## 2021-12-29 NOTE — Progress Notes (Signed)
No urinary tract infection.

## 2021-12-29 NOTE — Patient Instructions (Addendum)
Thank you for choosing Primary Care at [facility] for your medical home!    [place] was seen by Rema Fendt, NP today.   Vevelyn Francois Ivie's primary care provider is Ricky Stabs, NP.   For the best care possible,  you should try to see Ricky Stabs, NP whenever you come to clinic.   We look forward to seeing you again soon!  If you have any questions about your visit today,  please call us at [phone]  Or feel free to reach your provider via MyChart.    Keeping you healthy   Get these tests Blood pressure- Have your blood pressure checked once a year by your healthcare provider.  Normal blood pressure is 120/80. Weight- Have your body mass index (BMI) calculated to screen for obesity.  BMI is a measure of body fat based on height and weight. You can also calculate your own BMI at https://www.west-esparza.com/. Cholesterol- Have your cholesterol checked regularly starting at age 56, sooner may be necessary if you have diabetes, high blood pressure, if a family member developed heart diseases at an early age or if you smoke.  Chlamydia, HIV, and other sexual transmitted disease- Get screened each year until the age of 84 then within three months of each new sexual partner. Diabetes- Have your blood sugar checked regularly if you have high blood pressure, high cholesterol, a family history of diabetes or if you are overweight.   Get these vaccines Flu shot- Every fall. Tetanus shot- Every 10 years. Menactra- Single dose; prevents meningitis.   Take these steps Don't smoke- If you do smoke, ask your healthcare provider about quitting. For tips on how to quit, go to www.smokefree.gov or call 1-800-QUIT-NOW. Be physically active- Exercise 5 days a week for at least 30 minutes.  If you are not already physically active start slow and gradually work up to 30 minutes of moderate physical activity.  Examples of moderate activity include walking briskly, mowing the yard,  dancing, swimming bicycling, etc. Eat a healthy diet- Eat a variety of healthy foods such as fruits, vegetables, low fat milk, low fat cheese, yogurt, lean meats, poultry, fish, beans, tofu, etc.  For more information on healthy eating, go to www.thenutritionsource.org Drink alcohol in moderation- Limit alcohol intake two drinks or less a day.  Never drink and drive. Dentist- Brush and floss teeth twice daily; visit your dentis twice a year. Depression-Your emotional health is as important as your physical health.  If you're feeling down, losing interest in things you normally enjoy please talk with your healthcare provider. Gun Safety- If you keep a gun in your home, keep it unloaded and with the safety lock on.  Bullets should be stored separately. Helmet use- Always wear a helmet when riding a motorcycle, bicycle, rollerblading or skateboarding. Safe sex- If you may be exposed to a sexually transmitted infection, use a condom Seat belts- Seat bels can save your life; always wear one. Smoke/Carbon Monoxide detectors- These detectors need to be installed on the appropriate level of your home.  Replace batteries at least once a year. Skin Cancer- When out in the sun, cover up and use sunscreen SPF 15 or higher. Violence- If anyone is threatening or hurting you, please tell your healthcare provider.  Mediterranean Diet A Mediterranean diet refers to food and lifestyle choices that are based on the traditions of countries located on the [employer]. It focuses on eating more fruits, vegetables, whole grains, beans, nuts, seeds, and heart-healthy  fats, and eating less dairy, meat, eggs, and processed foods with added sugar, salt, and fat. This way of eating has been shown to help prevent certain conditions and improve outcomes for people who have chronic diseases, like kidney disease and heart disease. What are tips for following this plan? Reading food labels Check the serving size of packaged  foods. For foods such as rice and pasta, the serving size refers to the amount of cooked product, not dry. Check the total fat in packaged foods. Avoid foods that have saturated fat or trans fats. Check the ingredient list for added sugars, such as corn syrup. Shopping  Buy a variety of foods that offer a balanced diet, including: Fresh fruits and vegetables (produce). Grains, beans, nuts, and seeds. Some of these may be available in unpackaged forms or large amounts (in bulk). Fresh seafood. Poultry and eggs. Low-fat dairy products. Buy whole ingredients instead of prepackaged foods. Buy fresh fruits and vegetables in-season from local farmers markets. Buy plain frozen fruits and vegetables. If you do not have access to quality fresh seafood, buy precooked frozen shrimp or canned fish, such as tuna, salmon, or sardines. Stock your pantry so you always have certain foods on hand, such as olive oil, canned tuna, canned tomatoes, rice, pasta, and beans. Cooking Cook foods with extra-virgin olive oil instead of using butter or other vegetable oils. Have meat as a side dish, and have vegetables or grains as your main dish. This means having meat in small portions or adding small amounts of meat to foods like pasta or stew. Use beans or vegetables instead of meat in common dishes like chili or lasagna. Experiment with different cooking methods. Try roasting, broiling, steaming, and sauting vegetables. Add frozen vegetables to soups, stews, pasta, or rice. Add nuts or seeds for added healthy fats and plant protein at each meal. You can add these to yogurt, salads, or vegetable dishes. Marinate fish or vegetables using olive oil, lemon juice, garlic, and fresh herbs. Meal planning Plan to eat one vegetarian meal one day each week. Try to work up to two vegetarian meals, if possible. Eat seafood two or more times a week. Have healthy snacks readily available, such as: Vegetable sticks with  hummus. Greek yogurt. Fruit and nut trail mix. Eat balanced meals throughout the week. This includes: Fruit: 2-3 servings a day. Vegetables: 4-5 servings a day. Low-fat dairy: 2 servings a day. Fish, poultry, or lean meat: 1 serving a day. Beans and legumes: 2 or more servings a week. Nuts and seeds: 1-2 servings a day. Whole grains: 6-8 servings a day. Extra-virgin olive oil: 3-4 servings a day. Limit red meat and sweets to only a few servings a month. Lifestyle  Cook and eat meals together with your family, when possible. Drink enough fluid to keep your urine pale yellow. Be physically active every day. This includes: Aerobic exercise like running or swimming. Leisure activities like gardening, walking, or housework. Get 7-8 hours of sleep each night. If recommended by your health care provider, drink red wine in moderation. This means 1 glass a day for nonpregnant women and 2 glasses a day for men. A glass of wine equals 5 oz (150 mL). What foods should I eat? Fruits Apples. Apricots. Avocado. Berries. Bananas. Cherries. Dates. Figs. Grapes. Lemons. Melon. Oranges. Peaches. Plums. Pomegranate. Vegetables Artichokes. Beets. Broccoli. Cabbage. Carrots. Eggplant. Green beans. Chard. Kale. Spinach. Onions. Leeks. Peas. Squash. Tomatoes. Peppers. Radishes. Grains Whole-grain pasta. Brown rice. Bulgur wheat. Polenta. Couscous. Whole-wheat  bread. Orpah Cobbatmeal. Quinoa. Meats and other proteins Beans. Almonds. Sunflower seeds. Pine nuts. Peanuts. Cod. Salmon. Scallops. Shrimp. Tuna. Tilapia. Clams. Oysters. Eggs. Poultry without skin. Dairy Low-fat milk. Cheese. Greek yogurt. Fats and oils Extra-virgin olive oil. Avocado oil. Grapeseed oil. Beverages Water. Red wine. Herbal tea. Sweets and desserts Greek yogurt with honey. Baked apples. Poached pears. Trail mix. Seasonings and condiments Basil. Cilantro. Coriander. Cumin. Mint. Parsley. Sage. Rosemary. Tarragon. Garlic. Oregano. Thyme.  Pepper. Balsamic vinegar. Tahini. Hummus. Tomato sauce. Olives. Mushrooms. The items listed above may not be a complete list of foods and beverages you can eat. Contact a dietitian for more information. What foods should I limit? This is a list of foods that should be eaten rarely or only on special occasions. Fruits Fruit canned in syrup. Vegetables Deep-fried potatoes (french fries). Grains Prepackaged pasta or rice dishes. Prepackaged cereal with added sugar. Prepackaged snacks with added sugar. Meats and other proteins Beef. Pork. Lamb. Poultry with skin. Hot dogs. Tomasa BlaseBacon. Dairy Ice cream. Sour cream. Whole milk. Fats and oils Butter. Canola oil. Vegetable oil. Beef fat (tallow). Lard. Beverages Juice. Sugar-sweetened soft drinks. Beer. Liquor and spirits. Sweets and desserts Cookies. Cakes. Pies. Candy. Seasonings and condiments Mayonnaise. Pre-made sauces and marinades. The items listed above may not be a complete list of foods and beverages you should limit. Contact a dietitian for more information. Summary The Mediterranean diet includes both food and lifestyle choices. Eat a variety of fresh fruits and vegetables, beans, nuts, seeds, and whole grains. Limit the amount of red meat and sweets that you eat. If recommended by your health care provider, drink red wine in moderation. This means 1 glass a day for nonpregnant women and 2 glasses a day for men. A glass of wine equals 5 oz (150 mL). This information is not intended to replace advice given to you by your health care provider. Make sure you discuss any questions you have with your health care provider. Document Revised: 11/14/2019 Document Reviewed: 09/11/2019 Elsevier Patient Education  2022 [employer]

## 2021-12-30 ENCOUNTER — Other Ambulatory Visit: Payer: Self-pay | Admitting: Family

## 2021-12-30 ENCOUNTER — Encounter: Payer: Self-pay | Admitting: Family

## 2021-12-30 DIAGNOSIS — B9689 Other specified bacterial agents as the cause of diseases classified elsewhere: Secondary | ICD-10-CM | POA: Insufficient documentation

## 2021-12-30 DIAGNOSIS — N760 Acute vaginitis: Secondary | ICD-10-CM

## 2021-12-30 LAB — CERVICOVAGINAL ANCILLARY ONLY
Bacterial Vaginitis (gardnerella): POSITIVE — AB
Candida Glabrata: NEGATIVE
Candida Vaginitis: NEGATIVE
Chlamydia: NEGATIVE
Comment: NEGATIVE
Comment: NEGATIVE
Comment: NEGATIVE
Comment: NEGATIVE
Comment: NEGATIVE
Comment: NORMAL
Neisseria Gonorrhea: NEGATIVE
Trichomonas: NEGATIVE

## 2021-12-30 LAB — HEMOGLOBIN A1C
Est. average glucose Bld gHb Est-mCnc: 100 mg/dL
Hgb A1c MFr Bld: 5.1 % (ref 4.8–5.6)

## 2021-12-30 MED ORDER — METRONIDAZOLE 500 MG PO TABS
500.0000 mg | ORAL_TABLET | Freq: Two times a day (BID) | ORAL | 0 refills | Status: AC
Start: 2021-12-30 — End: 2022-01-06

## 2021-12-30 NOTE — Progress Notes (Signed)
Candida Vaginitis (sometimes called a yeast infection), Chlamydia, Gonorrhea, and Trichomonas negative.  ? ?Positive for Bacterial Vaginitis, an overgrowth of normal bacteria in the vagina due to changes in pH. Prescribed Metronidazole (Flagyl) twice per day for 7 days. Do not drink alcohol while taking this medication as this may cause severe nausea, vomiting, and upset stomach.  ? ?

## 2021-12-30 NOTE — Progress Notes (Signed)
No diabetes

## 2022-01-02 ENCOUNTER — Ambulatory Visit: Payer: Medicaid Other | Admitting: Physician Assistant

## 2022-01-03 ENCOUNTER — Encounter: Payer: Self-pay | Admitting: Family

## 2022-01-04 ENCOUNTER — Telehealth: Payer: Medicaid Other | Admitting: Nurse Practitioner

## 2022-01-04 DIAGNOSIS — M545 Low back pain, unspecified: Secondary | ICD-10-CM | POA: Diagnosis not present

## 2022-01-04 MED ORDER — CYCLOBENZAPRINE HCL 10 MG PO TABS: 10.0000 mg | ORAL_TABLET | Freq: Three times a day (TID) | ORAL | 0 refills | Status: DC | PRN

## 2022-01-04 MED ORDER — NAPROXEN 500 MG PO TABS: 500.0000 mg | ORAL_TABLET | Freq: Two times a day (BID) | ORAL | 0 refills | Status: DC

## 2022-01-04 NOTE — Progress Notes (Signed)

## 2022-01-10 ENCOUNTER — Encounter: Payer: Self-pay | Admitting: Obstetrics and Gynecology

## 2022-01-11 ENCOUNTER — Ambulatory Visit: Payer: Medicaid Other

## 2022-01-12 ENCOUNTER — Ambulatory Visit (INDEPENDENT_AMBULATORY_CARE_PROVIDER_SITE_OTHER): Payer: Medicaid Other | Admitting: Emergency Medicine

## 2022-01-12 ENCOUNTER — Other Ambulatory Visit: Payer: Self-pay

## 2022-01-12 VITALS — BP 111/74 | HR 86 | Ht 65.0 in | Wt 264.0 lb

## 2022-01-12 DIAGNOSIS — Z3200 Encounter for pregnancy test, result unknown: Secondary | ICD-10-CM

## 2022-01-12 DIAGNOSIS — Z3201 Encounter for pregnancy test, result positive: Secondary | ICD-10-CM

## 2022-01-12 LAB — POCT URINE PREGNANCY: Preg Test, Ur: POSITIVE — AB

## 2022-01-12 MED ORDER — VITAFOL ULTRA 29-0.6-0.4-200 MG PO CAPS: 1.0000 | ORAL_CAPSULE | Freq: Every day | ORAL | 11 refills | Status: DC

## 2022-01-12 NOTE — Progress Notes (Signed)
Ms. Dant presents today for UPT. She has no unusual complaints. ? ?LMP: 12/05/21 ?   ?OBJECTIVE: Appears well, in no apparent distress.  ?OB History   ? ? Gravida  ?8  ? Para  ?5  ? Term  ?2  ? Preterm  ?3  ? AB  ?3  ? Living  ?5  ?  ? ? SAB  ?3  ? IAB  ?0  ? Ectopic  ?0  ? Multiple  ?0  ? Live Births  ?5  ?   ?  ?  ? ?Home UPT Result: Positive ?In-Office UPT result: Positive ?I have reviewed the patient's medical, obstetrical, social, and family histories, and medications.  ? ?ASSESSMENT: Positive pregnancy test ? ?PLAN ?Prenatal care to be completed at: [facility] ?PNV Rx sent to pharmacy ?  ?

## 2022-01-12 NOTE — Progress Notes (Signed)
Patient was assessed and managed by nursing staff during this encounter. I have reviewed the chart and agree with the documentation and plan. I have also made any necessary editorial changes. ? ?[name] A Tyke Outman, MD ?01/12/2022 11:53 AM   ?

## 2022-01-16 ENCOUNTER — Ambulatory Visit: Payer: Self-pay | Admitting: Family

## 2022-01-18 ENCOUNTER — Telehealth: Payer: Self-pay | Admitting: Emergency Medicine

## 2022-01-18 ENCOUNTER — Telehealth: Payer: Medicaid Other | Admitting: Urgent Care

## 2022-01-18 DIAGNOSIS — R110 Nausea: Secondary | ICD-10-CM

## 2022-01-18 DIAGNOSIS — O219 Vomiting of pregnancy, unspecified: Secondary | ICD-10-CM

## 2022-01-18 MED ORDER — DOXYLAMINE-PYRIDOXINE 10-10 MG PO TBEC: DELAYED_RELEASE_TABLET | ORAL | 6 refills | Status: DC

## 2022-01-18 NOTE — Progress Notes (Signed)
For the safety of you and your child, I recommend a face to face office visit with a health care provider.  Many mothers need to take medicines during their pregnancy and while nursing.  Almost all medicines pass into the breast milk in small quantities.  Most are generally considered safe for a mother to take but some medicines must be avoided.  After reviewing your E-Visit request, I recommend that you consult your OB/GYN or pediatrician for medical advice in relation to your condition and prescription medications while pregnant or breastfeeding.  NOTE:  There will be NO CHARGE for this eVisit  If you are having a true medical emergency please call 911.    For an urgent face to face visit, [place] has six urgent care centers for your convenience:     [facility] [place] Get Driving Directions [phone] [address] [place], [place] [address]    [facility] ([place]) Get Driving Directions [phone] [address] [place], [place] [address]  [facility] ([place]) Get Driving Directions [phone] [address] [place],  [place]  [address]  [facility] Get Driving Directions [phone] [address], [address] [place], [place] [address]   [facility] [place] Get Driving Directions  [phone] [address] [place], [place] [address]    [facility] at [place] Get Driving Directions [phone] [address] [place], [place] [address]  Your MyChart E-visit questionnaire answers were reviewed by a board certified advanced clinical practitioner to complete your personal care plan based on your specific symptoms.  Thank you for using e-Visits. 

## 2022-01-18 NOTE — Telephone Encounter (Signed)
TC to patient regarding MyChart message about nausea and vomiting x1 week. This RN recommended unisom and vit B6 for symptoms. Pt states she is low income and does not have access to otc medication.  ?This RN sent Rx for Diclegis to pt's pharmacy. Pt verbalized understanding.  ?

## 2022-01-23 IMAGING — US US MFM OB FOLLOW-UP
1 series · 14 of 28 positions shown · non-contrast
Comparison: none

[Series 1: us mfm ob follow-up · 29 acquisitions, 14 frames shown]
[im 2/29]
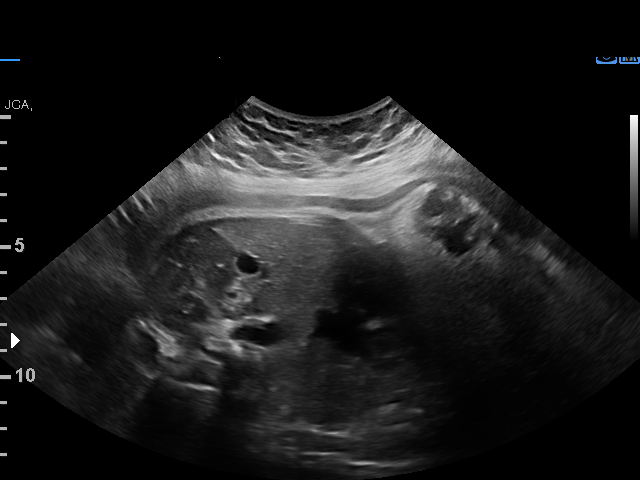
[im 4/29]
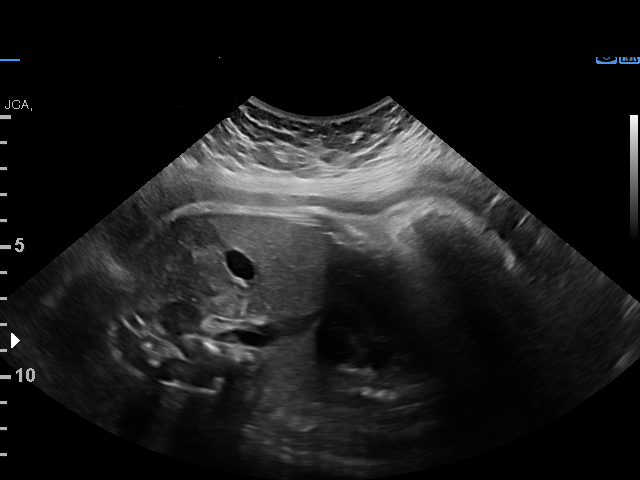
[im 6/29]
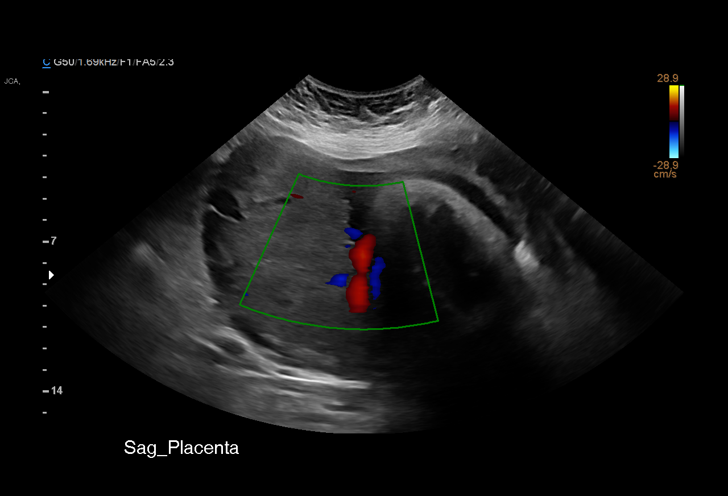
[im 8/29]
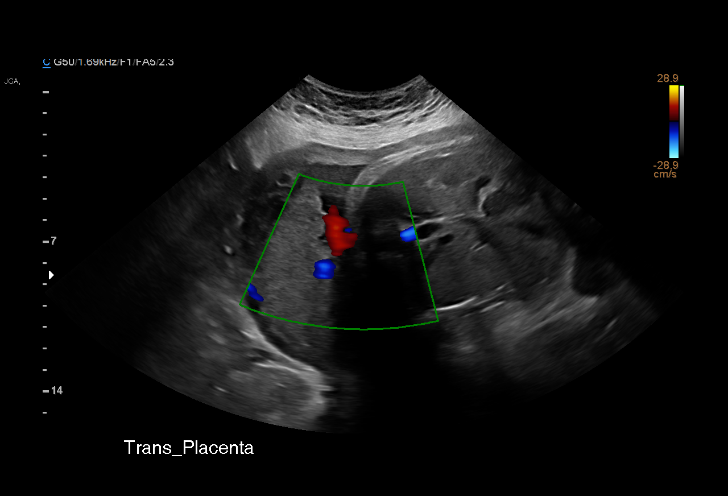
[im 10/29]
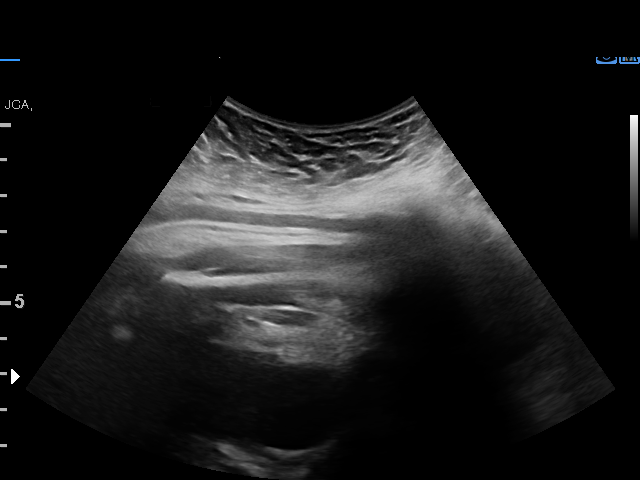
[im 12/29]
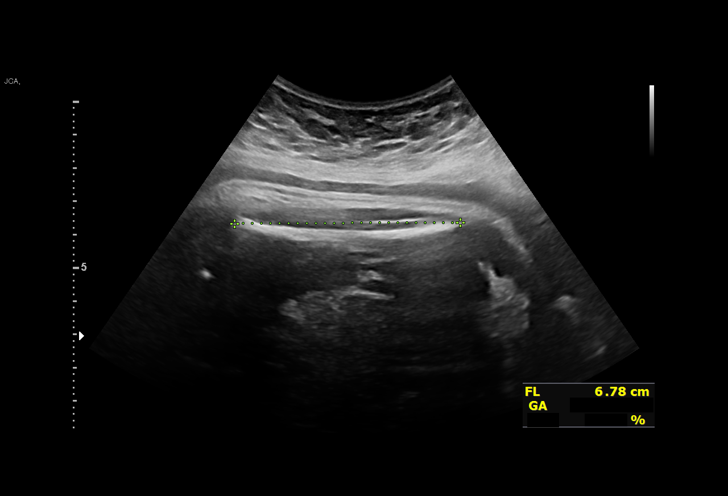
[im 14/29]
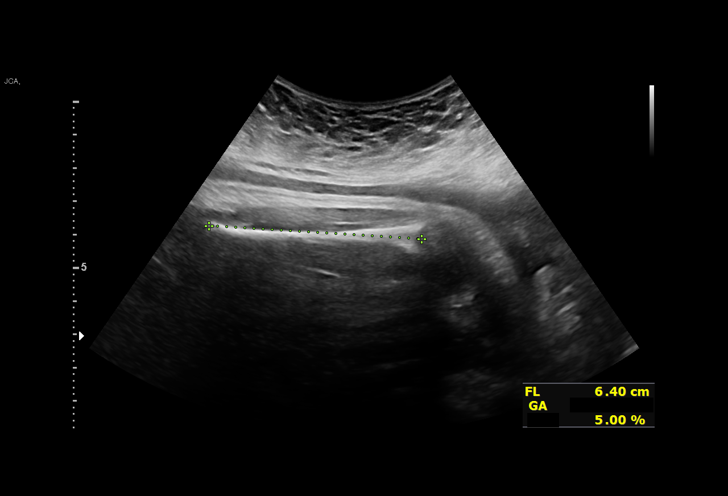
[im 16/29]
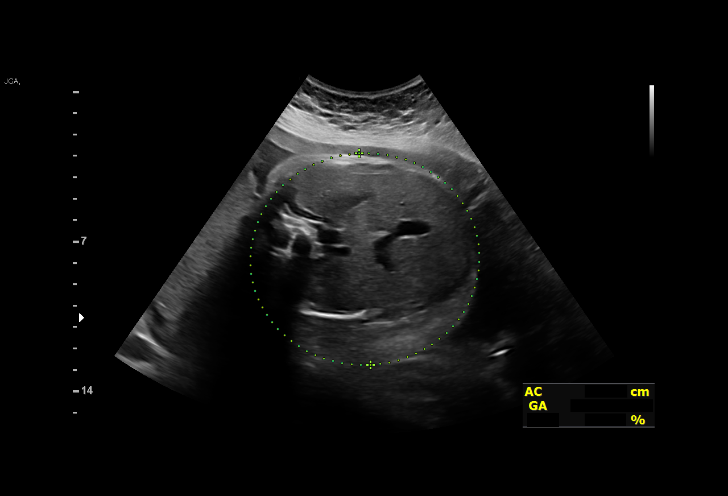
[im 18/29]
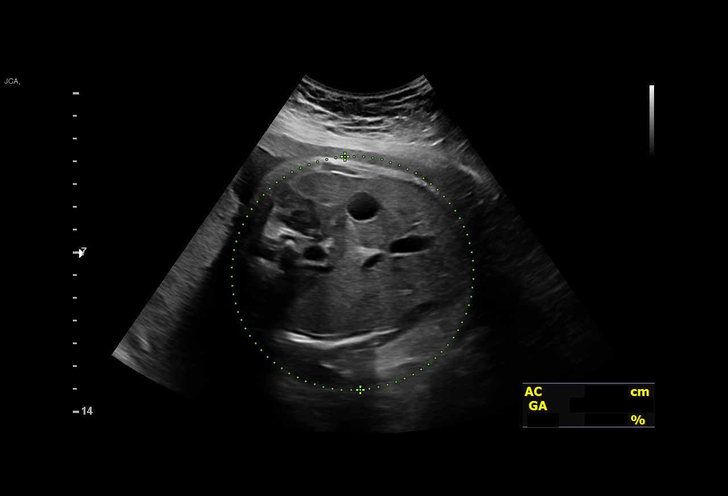
[im 20/29]
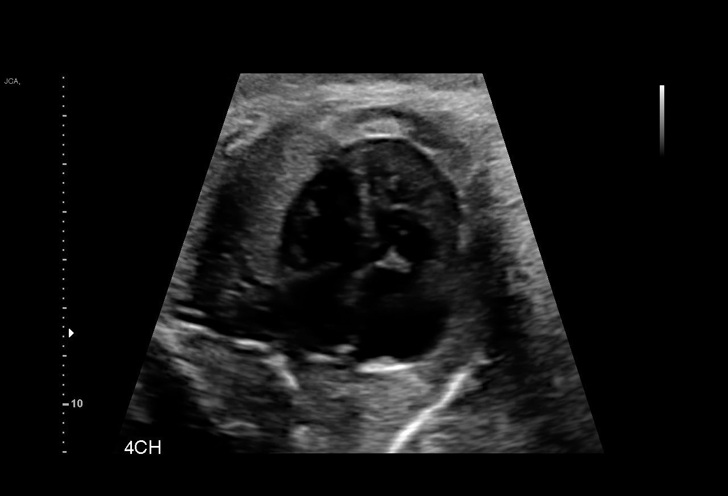
[im 22/29]
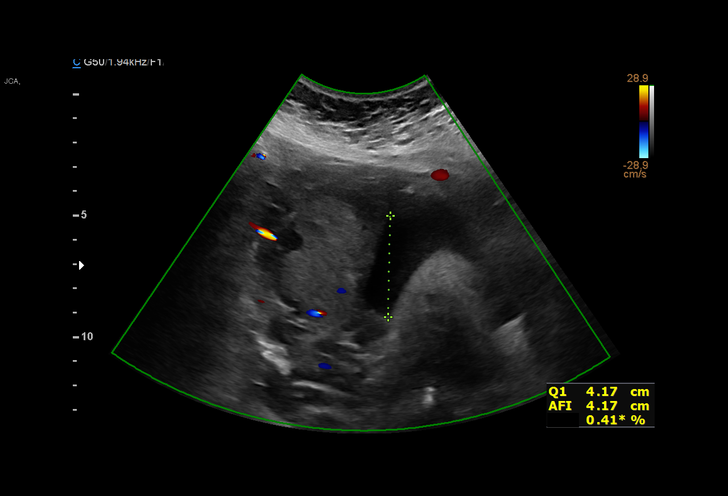
[im 24/29]
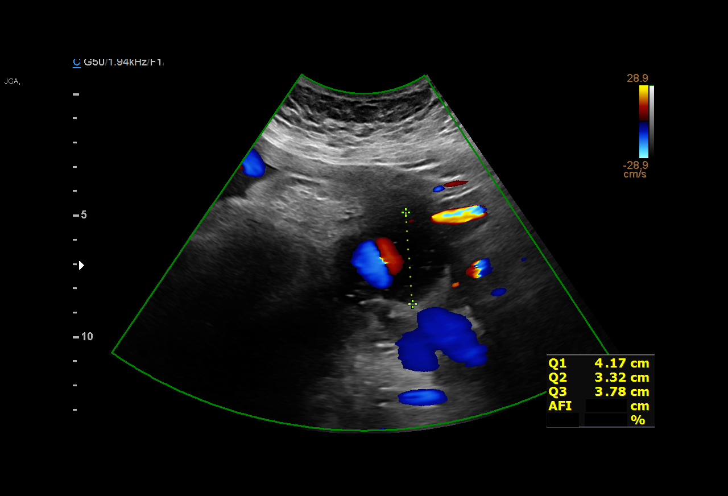
[im 26/29]
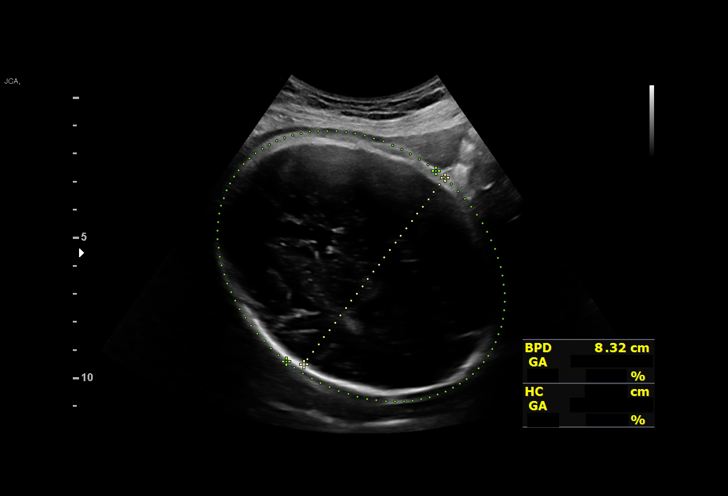
[im 29/29]
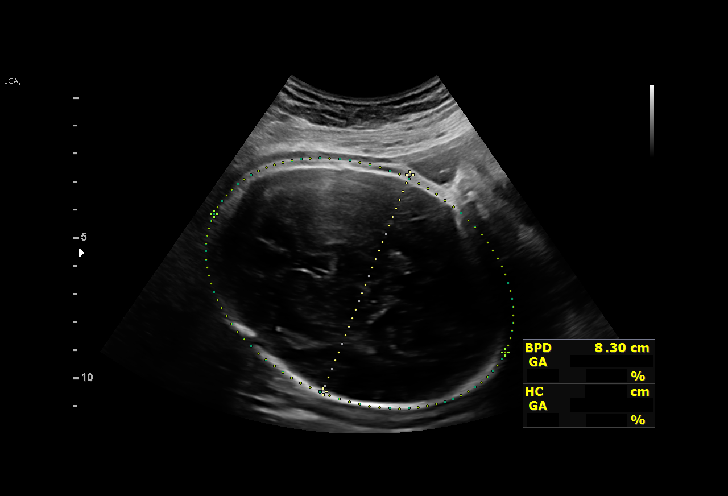

[14 of 28 positions shown; findings below may reference images not displayed]

Indications

 Obesity complicating pregnancy, second
 trimester (pregravid BMI 41)
 Poor obstetric history: Previous gestational
 HTN
 Poor obstetric history: Previous preterm
 delivery, antepartum x 3 (all 36 weeks)  - on
 17P
 Encounter for other antenatal screening
 follow-up
 35 weeks gestation of pregnancy
Fetal Evaluation

 Num Of Fetuses:         1
 Fetal Heart Rate(bpm):  151
 Cardiac Activity:       Observed
 Presentation:           Cephalic
 Placenta:               Posterior
 P. Cord Insertion:      Previously Visualized

 Amniotic Fluid
 AFI FV:      Within normal limits

 AFI Sum(cm)     %Tile       Largest Pocket(cm)
 14.71           53

 RUQ(cm)       RLQ(cm)       LUQ(cm)        LLQ(cm)

Biometry

 BPD:      84.6  mm     G. Age:  34w 1d         21  %    CI:        74.38   %    70 - 86
                                                         FL/HC:      20.8   %    20.1 -
 HC:      311.4  mm     G. Age:  34w 6d         11  %    HC/AC:      0.95        0.93 -
 AC:      326.4  mm     G. Age:  36w 4d         88  %    FL/BPD:     76.5   %    71 - 87
 FL:       64.7  mm     G. Age:  33w 3d          7  %    FL/AC:      19.8   %    20 - 24

 Est. FW:    4044  gm    5 lb 13 oz      48  %
OB History

 Gravidity:    8         Term:   1        Prem:   3        SAB:   3
 TOP:          0       Ectopic:  0        Living: 4
Gestational Age

 LMP:           58w 2d        Date:  06/29/19                 EDD:   04/04/20
 U/S Today:     34w 5d                                        EDD:   09/16/20
 Best:          35w 2d     Det. By:  Early Ultrasound         EDD:   09/12/20
                                     (02/10/20)
Anatomy

 Cranium:               Appears normal         Aortic Arch:            Previously seen
 Cavum:                 Appears normal         Ductal Arch:            Previously seen
 Ventricles:            Previously seen        Diaphragm:              Appears normal
 Choroid Plexus:        Previously seen        Stomach:                Appears normal, left
                                                                       sided
 Cerebellum:            Previously seen        Abdomen:                Appears normal
 Posterior Fossa:       Previously seen        Abdominal Wall:         Previously seen
 Nuchal Fold:           Not applicable (>20    Cord Vessels:           Previously seen
                        wks GA)
 Face:                  Orbits and profile     Kidneys:                Previously seen
                        previously seen
 Lips:                  Previously seen        Bladder:                Appears normal
 Thoracic:              Appears normal         Spine:                  Previously seen
 Heart:                 Previously seen        Upper Extremities:      Previously seen
 RVOT:                  Previously seen        Lower Extremities:      Previously seen
 LVOT:                  Previously seen

 Other:  Heels/feet and open hands/5th digits prev visualized. Nasal bone
         prev visualized.
Impression

 Follow up growth due to elevated BMI
 Normal interval growth with measurements consistent with
 dates
 Good fetal movement and amniotic fluid volume
Recommendations

 Follow up as clinically indicated
 Initiate weekly antenatal testing given elevated BMI > 40

## 2022-01-28 NOTE — Progress Notes (Deleted)
? ? ?Patient ID: [name], female    DOB: February 07, 1996  MRN: [id number] ? ?CC: No chief complaint on file. ? ? ?Subjective: ?[name] is a 26 y.o. female who presents for ?Her concerns today include: *** ? ?Patient should keep appt with Mariel Aloe, MD on 02/23/2022 at [facility] for physical.  ? ?Patient Active Problem List  ? Diagnosis Date Noted  ? Bacterial vaginitis 12/30/2021  ? Gestational hypertension 09/02/2020  ? Elevated liver enzymes 06/09/2020  ? BMI 37.0-37.9, adult 05/04/2020  ? History of gestational hypertension 04/21/2020  ? History of preterm delivery 04/06/2020  ?  ? ?Current Outpatient Medications on File Prior to Visit  ?Medication Sig Dispense Refill  ? albuterol (VENTOLIN HFA) 108 (90 Base) MCG/ACT inhaler Inhale 2 puffs into the lungs every 6 (six) hours as needed for wheezing or shortness of breath. 18 g 0  ? cyclobenzaprine (FLEXERIL) 10 MG tablet Take 1 tablet (10 mg total) by mouth 3 (three) times daily as needed for muscle spasms. 30 tablet 0  ? Doxylamine-Pyridoxine (DICLEGIS) 10-10 MG TBEC Take 2 tabs qhs then add 1 tab A.M and midday prn 90 tablet 6  ? FLUoxetine (PROZAC) 10 MG capsule Take 1 capsule (10 mg total) by mouth daily. 30 capsule 2  ? naproxen (NAPROSYN) 500 MG tablet Take 1 tablet (500 mg total) by mouth 2 (two) times daily with a meal. (Patient not taking: Reported on 01/12/2022) 30 tablet 0  ? omeprazole (PRILOSEC) 20 MG capsule Take 2 capsules (40 mg total) by mouth daily. 60 capsule 2  ? Prenat-Fe Poly-Methfol-FA-DHA (VITAFOL ULTRA) 29-0.6-0.4-200 MG CAPS Take 1 tablet by mouth daily. 30 capsule 11  ? SUMAtriptan (IMITREX) 25 MG tablet Take 25 mg (1 tablet total) by mouth at the start of the headache. May repeat in 2 hours x 1 if headache persists. Max of 2 tablets/24 hours. 10 tablet 0  ? ?No current facility-administered medications on file prior to visit.  ? ? ?Allergies  ?Allergen Reactions  ? Penicillins Anaphylaxis, Itching, Swelling, Rash and Other  (See Comments)  ?  Has patient had a PCN reaction causing immediate rash, facial/tongue/throat swelling, SOB or lightheadedness with hypotension: Yes ?Has patient had a PCN reaction causing severe rash involving mucus membranes or skin necrosis: No ?Has patient had a PCN reaction that required hospitalization: No ?Has patient had a PCN reaction occurring within the last 10 years: No ?If all of the above answers are "NO", then may proceed with Cephalosporin use.  And swelling  ? ? ?Social History  ? ?Socioeconomic History  ? Marital status: Married  ?  Spouse name: Not on file  ? Number of children: Not on file  ? Years of education: Not on file  ? Highest education level: Not on file  ?Occupational History  ? Not on file  ?Tobacco Use  ? Smoking status: Never  ?  Passive exposure: Never  ? Smokeless tobacco: Never  ?Vaping Use  ? Vaping Use: Never used  ?Substance and Sexual Activity  ? Alcohol use: Not Currently  ?  Comment: last drink 2 yrs ago  ? Drug use: No  ? Sexual activity: Yes  ?  Partners: Male, Female  ?  Birth control/protection: None  ?  Comment: female partner  ?Other Topics Concern  ? Not on file  ?Social History Narrative  ? Not on file  ? ?Social Determinants of Health  ? ?Financial Resource Strain: Not on file  ?Food Insecurity: Not on file  ?  Transportation Needs: Not on file  ?Physical Activity: Not on file  ?Stress: Not on file  ?Social Connections: Not on file  ?Intimate Partner Violence: Not on file  ? ? ?Family History  ?Problem Relation Age of Onset  ? Hypertension Mother   ? Hypertension Father   ? Diabetes Paternal Grandmother   ? Lung cancer Paternal Grandfather   ?     smoked  ? Cancer Paternal Grandfather   ? Seizures Paternal Grandfather   ? Breast cancer Maternal Aunt   ? ? ?Past Surgical History:  ?Procedure Laterality Date  ? CHOLECYSTECTOMY  04/03/2012  ? Procedure: LAPAROSCOPIC CHOLECYSTECTOMY WITH INTRAOPERATIVE CHOLANGIOGRAM;  Surgeon: Velora Hecklerodd M Gerkin, MD;  Location: [facility];   Service: General;  Laterality: N/A;  ? ERCP  03/12/2012  ? Procedure: ENDOSCOPIC RETROGRADE CHOLANGIOPANCREATOGRAPHY (ERCP);  Surgeon: Petra KubaMarc E Magod, MD;  [facility];  Service: Endoscopy;  Laterality: N/A;  ? INGUINAL HERNIA REPAIR N/A 05/15/2013  ? Procedure: HERNIA REPAIR INCISIONAL;  Surgeon: Velora Hecklerodd M Gerkin, MD;  Location: [facility];  Service: General;  Laterality: N/A;  ? INSERTION OF MESH N/A 05/15/2013  ? Procedure: INSERTION OF MESH;  Surgeon: Velora Hecklerodd M Gerkin, MD;  Location: [facility];  Service: General;  Laterality: N/A;  ? URETERAL STENT PLACEMENT    ? WISDOM TOOTH EXTRACTION    ? ? ?ROS: ?Review of Systems ?Negative except as stated above ? ?PHYSICAL EXAM: ?LMP 12/05/2021 (Exact Date)  ? ?Physical Exam ? ?{female adult master:310786} ?{female adult master:310785} ? ? ?  Latest Ref Rng & Units 09/12/2021  ?  4:22 PM 04/14/2021  ? 11:04 PM 09/02/2020  ? 12:37 AM  ?CMP  ?Glucose 70 - 99 mg/dL 88   098102   87    ?BUN 6 - 20 mg/dL 9   7   8     ?Creatinine 0.44 - 1.00 mg/dL 1.190.57   1.470.63   8.290.59    ?Sodium 135 - 145 mmol/L 138   139   135    ?Potassium 3.5 - 5.1 mmol/L 3.8   3.7   3.5    ?Chloride 98 - 111 mmol/L 102   106   103    ?CO2 22 - 32 mmol/L 28   24   20     ?Calcium 8.9 - 10.3 mg/dL 8.8   9.2   9.3    ?Total Protein 6.5 - 8.1 g/dL  6.5   6.7    ?Total Bilirubin 0.3 - 1.2 mg/dL  0.2   0.5    ?Alkaline Phos 38 - 126 U/L  83   152    ?AST 15 - 41 U/L  23   33    ?ALT 0 - 44 U/L  19   24    ? ?Lipid Panel  ?No results found for: CHOL, TRIG, HDL, CHOLHDL, VLDL, LDLCALC, LDLDIRECT ? ?CBC ?   ?Component Value Date/Time  ? WBC 6.0 09/12/2021 1622  ? RBC 4.48 09/12/2021 1622  ? HGB 14.1 09/12/2021 1622  ? HGB 13.6 06/13/2021 1402  ? HGB 13.8 08/21/2011 0000  ? HCT 41.5 09/12/2021 1622  ? HCT 39.9 06/13/2021 1402  ? HCT 39 08/21/2011 0000  ? PLT 245 09/12/2021 1622  ? PLT 285 06/13/2021 1402  ? PLT 273 08/21/2011 0000  ? MCV 92.6 09/12/2021 1622  ? MCV 92 06/13/2021 1402  ? MCH 31.5  09/12/2021 1622  ? MCHC 34.0 09/12/2021 1622  ? RDW 12.5 09/12/2021 1622  ?  RDW 12.5 06/13/2021 1402  ? LYMPHSABS 2.3 04/14/2021 2304  ? MONOABS 0.5 04/14/2021 2304  ? EOSABS 0.0 04/14/2021 2304  ? BASOSABS 0.0 04/14/2021 2304  ? ? ?ASSESSMENT AND PLAN: ? ?There are no diagnoses linked to this encounter. ? ? ?Patient was given the opportunity to ask questions.  Patient verbalized understanding of the plan and was able to repeat key elements of the plan. Patient was given clear instructions to go to Emergency Department or return to medical center if symptoms don't improve, worsen, or new problems develop.The patient verbalized understanding. ? ? ?No orders of the defined types were placed in this encounter. ? ? ? ?Requested Prescriptions  ? ? No prescriptions requested or ordered in this encounter  ? ? ?No follow-ups on file. ? ?Rema Fendt, NP  ?

## 2022-01-29 NOTE — Progress Notes (Deleted)
? ?Synopsis: Referred for chronic cough by No ref. provider found ? ?Subjective:  ? ?PATIENT ID: [place] GENDER: female DOB: Aug 12, 1996, MRN: [id number] ? ?No chief complaint on file. ? ?25yF with history of CKD, anxiety, obesity ? ?She never did have covid-19 infection, not vaccinated for it though.  ? ?She has never had diagnosis of asthma. She has tried albuterol but doesn't find that it relieves cough, dyspnea. ? ?Cough for last 6 months. Dyspneic with exertion. Over last 6 months. She has a little bit of PND. She has only a little bit of acid reflux sensation.  ? ?Grandfather had lung cancer ? ?She is not working currently. She has worked in Pharmac[employer], was Conservation officer, nature at [employer]. She has never smoked or vaped. She does not have pet at home.  ? ? ?Interval HPI: ?Started 8 week trial ppi, planned for PFTs today.  ? ?Otherwise pertinent review of systems is negative. ? ?Past Medical History:  ?Diagnosis Date  ? Anxiety   ? Chlamydia 2018, 2021  ? Chronic kidney disease   ? kidney stones and kidney infection with last pregnancy  ? Depression   ? "gets in her moods, currently ok'  ? Gonorrhea 2018  ? Headache   ? Incisional hernia 04/2013  ? Infection   ? UTI  ? LGSIL on Pap smear of cervix 2019  ? Ovarian cyst   ? Urethral diverticulum   ?  ? ?Family History  ?Problem Relation Age of Onset  ? Hypertension Mother   ? Hypertension Father   ? Diabetes Paternal Grandmother   ? Lung cancer Paternal Grandfather   ?     smoked  ? Cancer Paternal Grandfather   ? Seizures Paternal Grandfather   ? Breast cancer Maternal Aunt   ?  ? ?Past Surgical History:  ?Procedure Laterality Date  ? CHOLECYSTECTOMY  04/03/2012  ? Procedure: LAPAROSCOPIC CHOLECYSTECTOMY WITH INTRAOPERATIVE CHOLANGIOGRAM;  Surgeon: Velora Heckler, MD;  Location: [facility];  Service: General;  Laterality: N/A;  ? ERCP  03/12/2012  ? Procedure: ENDOSCOPIC RETROGRADE CHOLANGIOPANCREATOGRAPHY (ERCP);  Surgeon: Petra Kuba, MD;  Location: [facility];  Service:  Endoscopy;  Laterality: N/A;  ? INGUINAL HERNIA REPAIR N/A 05/15/2013  ? Procedure: HERNIA REPAIR INCISIONAL;  Surgeon: Velora Heckler, MD;  Location: [facility];  Service: General;  Laterality: N/A;  ? INSERTION OF MESH N/A 05/15/2013  ? Procedure: INSERTION OF MESH;  Surgeon: Velora Heckler, MD;  Location: [facility];  Service: General;  Laterality: N/A;  ? URETERAL STENT PLACEMENT    ? WISDOM TOOTH EXTRACTION    ? ? ?Social History  ? ?Socioeconomic History  ? Marital status: Married  ?  Spouse name: Not on file  ? Number of children: Not on file  ? Years of education: Not on file  ? Highest education level: Not on file  ?Occupational History  ? Not on file  ?Tobacco Use  ? Smoking status: Never  ?  Passive exposure: Never  ? Smokeless tobacco: Never  ?Vaping Use  ? Vaping Use: Never used  ?Substance and Sexual Activity  ? Alcohol use: Not Currently  ?  Comment: last drink 2 yrs ago  ? Drug use: No  ? Sexual activity: Yes  ?  Partners: Male, Female  ?  Birth control/protection: None  ?  Comment: female partner  ?Other Topics Concern  ? Not on file  ?Social History Narrative  ? Not on file  ? ?  Social Determinants of Health  ? ?Financial Resource Strain: Not on file  ?Food Insecurity: Not on file  ?Transportation Needs: Not on file  ?Physical Activity: Not on file  ?Stress: Not on file  ?Social Connections: Not on file  ?Intimate Partner Violence: Not on file  ?  ? ?Allergies  ?Allergen Reactions  ? Penicillins Anaphylaxis, Itching, Swelling, Rash and Other (See Comments)  ?  Has patient had a PCN reaction causing immediate rash, facial/tongue/throat swelling, SOB or lightheadedness with hypotension: Yes ?Has patient had a PCN reaction causing severe rash involving mucus membranes or skin necrosis: No ?Has patient had a PCN reaction that required hospitalization: No ?Has patient had a PCN reaction occurring within the last 10 years: No ?If all of the above answers are "NO", then may  proceed with Cephalosporin use.  And swelling  ?  ? ?Outpatient Medications Prior to Visit  ?Medication Sig Dispense Refill  ? albuterol (VENTOLIN HFA) 108 (90 Base) MCG/ACT inhaler Inhale 2 puffs into the lungs every 6 (six) hours as needed for wheezing or shortness of breath. 18 g 0  ? cyclobenzaprine (FLEXERIL) 10 MG tablet Take 1 tablet (10 mg total) by mouth 3 (three) times daily as needed for muscle spasms. 30 tablet 0  ? Doxylamine-Pyridoxine (DICLEGIS) 10-10 MG TBEC Take 2 tabs qhs then add 1 tab A.M and midday prn 90 tablet 6  ? FLUoxetine (PROZAC) 10 MG capsule Take 1 capsule (10 mg total) by mouth daily. 30 capsule 2  ? naproxen (NAPROSYN) 500 MG tablet Take 1 tablet (500 mg total) by mouth 2 (two) times daily with a meal. (Patient not taking: Reported on 01/12/2022) 30 tablet 0  ? omeprazole (PRILOSEC) 20 MG capsule Take 2 capsules (40 mg total) by mouth daily. 60 capsule 2  ? Prenat-Fe Poly-Methfol-FA-DHA (VITAFOL ULTRA) 29-0.6-0.4-200 MG CAPS Take 1 tablet by mouth daily. 30 capsule 11  ? SUMAtriptan (IMITREX) 25 MG tablet Take 25 mg (1 tablet total) by mouth at the start of the headache. May repeat in 2 hours x 1 if headache persists. Max of 2 tablets/24 hours. 10 tablet 0  ? ?No facility-administered medications prior to visit.  ? ? ? ? ? ?Objective:  ? ?Physical Exam: ? ?General appearance: 26 y.o., female, NAD, conversant  ?Eyes: anicteric sclerae; PERRL, tracking appropriately ?HENT: NCAT; MMM ?Neck: Trachea midline; no lymphadenopathy, no JVD ?Lungs: CTAB, no crackles, no wheeze, with normal respiratory effort ?CV: tachy, RR, no murmur  ?Abdomen: Soft, non-tender; non-distended, BS present  ?Extremities: No peripheral edema, warm ?Skin: Normal turgor and texture; no rash ?Psych: Appropriate affect ?Neuro: Alert and oriented to person and place, no focal deficit  ? ? ? ?There were no vitals filed for this visit. ? ?  on RA ?BMI Readings from Last 3 Encounters:  ?01/12/22 43.93 kg/m?  ?12/29/21  44.52 kg/m?  ?12/14/21 42.13 kg/m?  ? ?Wt Readings from Last 3 Encounters:  ?01/12/22 264 lb (119.7 kg)  ?12/29/21 264 lb (119.7 kg)  ?12/14/21 261 lb (118.4 kg)  ? ? ? ?CBC ?   ?Component Value Date/Time  ? WBC 6.0 09/12/2021 1622  ? RBC 4.48 09/12/2021 1622  ? HGB 14.1 09/12/2021 1622  ? HGB 13.6 06/13/2021 1402  ? HGB 13.8 08/21/2011 0000  ? HCT 41.5 09/12/2021 1622  ? HCT 39.9 06/13/2021 1402  ? HCT 39 08/21/2011 0000  ? PLT 245 09/12/2021 1622  ? PLT 285 06/13/2021 1402  ? PLT 273 08/21/2011 0000  ? MCV 92.6  09/12/2021 1622  ? MCV 92 06/13/2021 1402  ? MCH 31.5 09/12/2021 1622  ? MCHC 34.0 09/12/2021 1622  ? RDW 12.5 09/12/2021 1622  ? RDW 12.5 06/13/2021 1402  ? LYMPHSABS 2.3 04/14/2021 2304  ? MONOABS 0.5 04/14/2021 2304  ? EOSABS 0.0 04/14/2021 2304  ? BASOSABS 0.0 04/14/2021 2304  ? ? ?Eos 0-100 ? ?Chest Imaging: ?CXR 09/12/21 reviewed by me unremarkable ? ?Pulmonary Functions Testing Results: ?   ? View : No data to display.  ?  ?  ?  ? ? ? ?   ?Assessment & Plan:  ? ?# Chronic cough ?Unclear etiology. Intermittent reflux and only a little postnasal drainage.  ? ?Plan: ?- We decided to give acid reflux control an 8 week trial first. She'll start omeprazole 40 mg 30 min before dinner nightly ?- albuterol prn ?- PFT on same day as next appointment in 6 weeks or so ? ? ? ? ?Omar Person, MD ?[facility] ?01/29/2022 3:51 PM  ? ? ?

## 2022-01-31 ENCOUNTER — Ambulatory Visit: Payer: Medicaid Other | Admitting: Student

## 2022-02-01 ENCOUNTER — Encounter: Payer: Medicaid Other | Admitting: Family

## 2022-02-09 ENCOUNTER — Encounter: Payer: Self-pay | Admitting: Physician Assistant

## 2022-02-09 ENCOUNTER — Telehealth: Payer: Medicaid Other | Admitting: Physician Assistant

## 2022-02-09 DIAGNOSIS — N898 Other specified noninflammatory disorders of vagina: Secondary | ICD-10-CM

## 2022-02-09 DIAGNOSIS — Z3491 Encounter for supervision of normal pregnancy, unspecified, first trimester: Secondary | ICD-10-CM

## 2022-02-09 DIAGNOSIS — R399 Unspecified symptoms and signs involving the genitourinary system: Secondary | ICD-10-CM

## 2022-02-09 NOTE — Progress Notes (Signed)
For the safety of you and your child, I recommend a face to face office visit with a health care provider.  Many mothers need to take medicines during their pregnancy and while nursing.  Almost all medicines pass into the breast milk in small quantities.  Most are generally considered safe for a mother to take but some medicines must be avoided.  After reviewing your E-Visit request, I recommend that you consult your OB/GYN or pediatrician for medical advice in relation to your condition and prescription medications while pregnant or breastfeeding.  NOTE:  There will be NO CHARGE for this eVisit  If you are having a true medical emergency please call 911.    For an urgent face to face visit, [facility] has six urgent care centers for your convenience:     [facility] [place] Get Driving Directions [phone] [address] [place], [place], [place] [address]    [facility] Get Driving Directions [phone] [address] [place], [place] [address]  [facility] [facility] [place] Get Driving Directions [phone] [address] [place],  [place]  [address]  [facility] Get Driving Directions [phone] [address], [address] [place], [place] [address]   [facility] [place] Get Driving Directions  [phone] [address] [place], [place] [address]   [facility] [place] Get Driving Directions [phone] [address] [place], [place] [address]  Your MyChart E-visit questionnaire answers were reviewed by a board certified advanced clinical practitioner to complete your personal care plan based on your specific symptoms.  Thank you for using e-Visits. 

## 2022-02-11 ENCOUNTER — Telehealth: Payer: Medicaid Other | Admitting: Nurse Practitioner

## 2022-02-11 DIAGNOSIS — B9689 Other specified bacterial agents as the cause of diseases classified elsewhere: Secondary | ICD-10-CM | POA: Diagnosis not present

## 2022-02-11 DIAGNOSIS — N760 Acute vaginitis: Secondary | ICD-10-CM | POA: Diagnosis not present

## 2022-02-11 MED ORDER — METRONIDAZOLE 500 MG PO TABS: 500.0000 mg | ORAL_TABLET | Freq: Two times a day (BID) | ORAL | 0 refills | Status: AC

## 2022-02-11 NOTE — Progress Notes (Signed)

## 2022-02-11 NOTE — Progress Notes (Signed)
I have spent 5 minutes in review of e-visit questionnaire, review and updating patient chart, medical decision making and response to patient.  ° °Riccardo Holeman W Goku Harb, NP ° °  °

## 2022-02-15 ENCOUNTER — Ambulatory Visit: Payer: Medicaid Other | Admitting: Registered"

## 2022-02-16 ENCOUNTER — Telehealth: Payer: Medicaid Other | Admitting: Physician Assistant

## 2022-02-16 DIAGNOSIS — J309 Allergic rhinitis, unspecified: Secondary | ICD-10-CM

## 2022-02-16 MED ORDER — LEVOCETIRIZINE DIHYDROCHLORIDE 5 MG PO TABS: 5.0000 mg | ORAL_TABLET | Freq: Every evening | ORAL | 0 refills | Status: DC

## 2022-02-16 MED ORDER — FLUTICASONE PROPIONATE 50 MCG/ACT NA SUSP: 2.0000 | Freq: Every day | NASAL | 0 refills | Status: DC

## 2022-02-16 NOTE — Progress Notes (Signed)
I have spent 5 minutes in review of e-visit questionnaire, review and updating patient chart, medical decision making and response to patient.   Yashar Inclan Cody Shadasia Oldfield, PA-C    

## 2022-02-16 NOTE — Progress Notes (Signed)
E visit for Allergic Rhinitis ?We are sorry that you are not feeling well.  Here is how we plan to help! ? ?Based on what you have shared with me it looks like you have Allergic Rhinitis.  Rhinitis is when a reaction occurs that causes nasal congestion, runny nose, sneezing, and itching.  Most types of rhinitis are caused by an inflammation and are associated with symptoms in the eyes ears or throat. ?There are several types of rhinitis.  The most common are acute rhinitis, which is usually caused by a viral illness, allergic or seasonal rhinitis, and nonallergic or year-round rhinitis.  Nasal allergies occur certain times of the year.  Allergic rhinitis is caused when allergens in the air trigger the release of histamine in the body.  Histamine causes itching, swelling, and fluid to build up in the fragile linings of the nasal passages, sinuses and eyelids.  An itchy nose and clear discharge are common. ? ?I recommend the following over the counter treatments: ?You should take a daily dose of antihistamine and Xyzal 5 mg take 1 tablet daily - I have sent in a prescription for this to hopefully make cheaper.  ? ?I also would recommend a nasal spray: ?Flonase 2 sprays into each nostril once daily - I have sent in a prescription for this to hopefully make cheaper.  ? ?You may also benefit from eye drops such as: ?Systane 1-2 driops each eye twice daily as needed ? ?HOME CARE: ? ?You can use an over-the-counter saline nasal spray as needed ?Avoid areas where there is heavy dust, mites, or molds ?Stay indoors on windy days during the pollen season ?Keep windows closed in home, at least in bedroom; use air conditioner. ?Use high-efficiency house air filter ?Keep windows closed in car, turn [facility] on re-circulate ?Avoid playing out with dog during pollen season ? ?GET HELP RIGHT AWAY IF: ? ?If your symptoms do not improve within 10 days ?You become short of breath ?You develop yellow or green discharge from your nose for over  3 days ?You have coughing fits ? ?MAKE SURE YOU: ? ?Understand these instructions ?Will watch your condition ?Will get help right away if you are not doing well or get worse ? ?Thank you for choosing an e-visit. ?Your e-visit answers were reviewed by a board certified advanced clinical practitioner to complete your personal care plan. Depending upon the condition, your plan could have included both over the counter or prescription medications. ?Please review your pharmacy choice. Be sure that the pharmacy you have chosen is open so that you can pick up your prescription now.  If there is a problem you may message your provider in MyChart to have the prescription routed to another pharmacy. ?Your safety is important to [place]. If you have drug allergies check your prescription carefully.  ?For the next 24 hours, you can use MyChart to ask questions about today?s visit, request a non-urgent call back, or ask for a work or school excuse from your e-visit provider. ?You will get an email in the next two days asking about your experience. I hope that your e-visit has been valuable and will speed your recovery. ? ? ? ? ? ? ? ?

## 2022-02-23 ENCOUNTER — Encounter: Payer: Medicaid Other | Admitting: Obstetrics and Gynecology

## 2022-02-24 ENCOUNTER — Ambulatory Visit (INDEPENDENT_AMBULATORY_CARE_PROVIDER_SITE_OTHER): Payer: Medicaid Other | Admitting: Physician Assistant

## 2022-02-24 DIAGNOSIS — M5440 Lumbago with sciatica, unspecified side: Secondary | ICD-10-CM

## 2022-02-24 DIAGNOSIS — M545 Low back pain, unspecified: Secondary | ICD-10-CM | POA: Insufficient documentation

## 2022-02-24 MED ORDER — METHYLPREDNISOLONE 4 MG PO TBPK: ORAL_TABLET | ORAL | 0 refills | Status: DC

## 2022-02-24 NOTE — Progress Notes (Signed)
? ?Office Visit Note ?  ?Patient: [name]           ?Date of Birth: Feb 11, 1996           ?MRN: [id number] ?Visit Date: 02/24/2022 ?             ?Requested by: Rema Fendt, NP ?[phone] ?[address] ?[place],  [place] [address] ?PCP: Rema Fendt, NP ? ?Chief Complaint  ?Patient presents with  ? Lower Back - Pain  ? Right Knee - Pain  ? ? ? ? ?HPI: ?Patient is a pleasant 26 year old woman complaining of low back pain radiating into her right knee.  She denies any injury.  This was significant enough that she went to an urgent care where x-rays of her lumbar spine and knee were overall normal.  She was given Flexeril which she does not want to take and she is taking Naprosyn.  She thinks this has all gotten worse for over a year since her most recent pregnancy a year ago.  She believes she has some problems with her pelvic alignment.  She also has some urine incontinency but that was to be worked up by gynecologist first and she admits she has not made that appointment.  Denies any loss of bowel control denies any numbness or weakness her legs giving way ? ?Assessment & Plan: ?Visit Diagnoses:  ?1. Acute right-sided low back pain with sciatica, sciatica laterality unspecified   ? ? ?Plan: Low back pain.  I would like for her to begin physical therapy.  Would also like for her to try a Medrol Dosepak.  We discussed the side effects of this.  She will follow-up in 5 weeks for reevaluation.  If she still has significant symptoms we could certainly go forward with an MRI ? ?Follow-Up Instructions: No follow-ups on file.  ? ?Ortho Exam ? ?Patient is alert, oriented, no adenopathy, well-dressed, normal affect, normal respiratory effort. ?Examination of the lower back she has pain focused over the right buttocks.  She has some pain with flexion and extension.  Strength is 5 out of 5 with resisted dorsiflexion plantarflexion of her ankles extension and flexion of her legs and flexion of her hips.  She has  good feeling in her feet. ? ?Imaging: ?No results found. ?No images are attached to the encounter. ? ?Labs: ?Lab Results  ?Component Value Date  ? HGBA1C 5.1 12/29/2021  ? HGBA1C 5.0 06/13/2021  ? HGBA1C 4.6 (L) 05/20/2020  ? LABURIC 1.6 (L) 02/20/2012  ? LABURIC 1.2 (L) 02/17/2012  ? LABURIC 0.8 (L) 02/03/2012  ? REPTSTATUS 07/24/2020 FINAL 07/23/2020  ? CULT (A) 07/23/2020  ?  <10,000 COLONIES/mL INSIGNIFICANT GROWTH ?NO GROUP B STREP (S.AGALACTIAE) ISOLATED ?Performed at [facility], [address] [place], [place] [address] ?  ? LABORGA ESCHERICHIA COLI 03/08/2012  ? ? ? ?Lab Results  ?Component Value Date  ? ALBUMIN 3.8 04/14/2021  ? ALBUMIN 3.0 (L) 09/02/2020  ? ALBUMIN 3.9 08/31/2020  ? ? ?No results found for: MG ?No results found for: VD25OH ? ?No results found for: PREALBUMIN ? ?  Latest Ref Rng & Units 09/12/2021  ?  4:22 PM 06/13/2021  ?  2:02 PM 04/14/2021  ? 11:04 PM  ?CBC EXTENDED  ?WBC 4.0 - 10.5 K/uL 6.0   6.4   9.4    ?RBC 3.87 - 5.11 MIL/uL 4.48   4.32   4.18    ?Hemoglobin 12.0 - 15.0 g/dL 14.1  13.6   13.5    ?HCT 36.0 - 46.0 % 41.5   39.9   38.2    ?Platelets 150 - 400 K/uL 245   285   246    ?NEUT# 1.7 - 7.7 K/uL   6.4    ?Lymph# 0.7 - 4.0 K/uL   2.3    ? ? ? ?There is no height or weight on file to calculate BMI. ? ?Orders:  ?No orders of the defined types were placed in this encounter. ? ?No orders of the defined types were placed in this encounter. ? ? ? Procedures: ?No procedures performed ? ?Clinical Data: ?No additional findings. ? ?ROS: ? ?All other systems negative, except as noted in the HPI. ?Review of Systems ? ?Objective: ?Vital Signs: LMP 12/05/2021 (Exact Date)  ? ?Specialty Comments:  ?No specialty comments available. ? ?PMFS History: ?Patient Active Problem List  ? Diagnosis Date Noted  ? Low back pain 02/24/2022  ? Bacterial vaginitis 12/30/2021  ? Gestational hypertension 09/02/2020  ? Elevated liver enzymes 06/09/2020  ? BMI 37.0-37.9, adult 05/04/2020  ? History of  gestational hypertension 04/21/2020  ? History of preterm delivery 04/06/2020  ? ?Past Medical History:  ?Diagnosis Date  ? Anxiety   ? Chlamydia 2018, 2021  ? Chronic kidney disease   ? kidney stones and kidney infection with last pregnancy  ? Depression   ? "gets in her moods, currently ok'  ? Gonorrhea 2018  ? Headache   ? Incisional hernia 04/2013  ? Infection   ? UTI  ? LGSIL on Pap smear of cervix 2019  ? Ovarian cyst   ? Urethral diverticulum   ?  ?Family History  ?Problem Relation Age of Onset  ? Hypertension Mother   ? Hypertension Father   ? Diabetes Paternal Grandmother   ? Lung cancer Paternal Grandfather   ?     smoked  ? Cancer Paternal Grandfather   ? Seizures Paternal Grandfather   ? Breast cancer Maternal Aunt   ?  ?Past Surgical History:  ?Procedure Laterality Date  ? CHOLECYSTECTOMY  04/03/2012  ? Procedure: LAPAROSCOPIC CHOLECYSTECTOMY WITH INTRAOPERATIVE CHOLANGIOGRAM;  Surgeon: Velora Heckler, MD;  Location: [facility];  Service: General;  Laterality: N/A;  ? ERCP  03/12/2012  ? Procedure: ENDOSCOPIC RETROGRADE CHOLANGIOPANCREATOGRAPHY (ERCP);  Surgeon: Petra Kuba, MD;  Location: [facility];  Service: Endoscopy;  Laterality: N/A;  ? INGUINAL HERNIA REPAIR N/A 05/15/2013  ? Procedure: HERNIA REPAIR INCISIONAL;  Surgeon: Velora Heckler, MD;  Location: [facility];  Service: General;  Laterality: N/A;  ? INSERTION OF MESH N/A 05/15/2013  ? Procedure: INSERTION OF MESH;  Surgeon: Velora Heckler, MD;  Location: [facility];  Service: General;  Laterality: N/A;  ? URETERAL STENT PLACEMENT    ? WISDOM TOOTH EXTRACTION    ? ?Social History  ? ?Occupational History  ? Not on file  ?Tobacco Use  ? Smoking status: Never  ?  Passive exposure: Never  ? Smokeless tobacco: Never  ?Vaping Use  ? Vaping Use: Never used  ?Substance and Sexual Activity  ? Alcohol use: Not Currently  ?  Comment: last drink 2 yrs ago  ? Drug use: No  ? Sexual activity: Yes  ?  Partners: Male, Female  ?  Birth  control/protection: None  ?  Comment: female partner  ? ? ? ? ? ?

## 2022-03-01 ENCOUNTER — Ambulatory Visit: Payer: Medicaid Other | Admitting: Orthopaedic Surgery

## 2022-03-10 ENCOUNTER — Other Ambulatory Visit: Payer: Self-pay | Admitting: Student

## 2022-03-10 ENCOUNTER — Other Ambulatory Visit: Payer: Self-pay | Admitting: Family

## 2022-03-10 DIAGNOSIS — G43009 Migraine without aura, not intractable, without status migrainosus: Secondary | ICD-10-CM

## 2022-03-11 ENCOUNTER — Other Ambulatory Visit: Payer: Self-pay | Admitting: Family

## 2022-03-11 DIAGNOSIS — G43009 Migraine without aura, not intractable, without status migrainosus: Secondary | ICD-10-CM

## 2022-03-13 MED ORDER — SUMATRIPTAN SUCCINATE 25 MG PO TABS: ORAL_TABLET | ORAL | 0 refills | Status: DC

## 2022-03-31 ENCOUNTER — Ambulatory Visit: Payer: Medicaid Other | Admitting: Physician Assistant

## 2022-04-10 ENCOUNTER — Ambulatory Visit: Payer: Medicaid Other | Admitting: Physician Assistant

## 2022-04-10 NOTE — Progress Notes (Unsigned)
[facility] New Patient Evaluation and Consultation  Referring Provider: Camillia Herter, NP PCP: Camillia Herter, NP Date of Service: 04/11/2022  SUBJECTIVE Chief Complaint: No chief complaint on file.  History of Present Illness: [name] is a 26 y.o. White or Caucasian female seen in consultation at the request of NP Amy Minette Brine for evaluation of ***.    Review of records from [employer] significant for: Has been leaking urine last several months. Needs to wear depends. Leaks with coughing and at rest.   Urinary Symptoms: {urine leakage?:24754} Leaks *** time(s) per {days/wks/mos/yrs:310907}.  Pad use: {NUMBERS 1-10:18281} {pad option:24752} per day.   She {ACTION; IS/IS GI:087931 bothered by her UI symptoms.  Day time voids ***.  Nocturia: *** times per night to void. Voiding dysfunction: she {empties:24755} her bladder well.  {DOES NOT does:27190::"does not"} use a catheter to empty bladder.  When urinating, she feels {urine symptoms:24756} Drinks: *** per day  UTIs: {NUMBERS 1-10:18281} UTI's in the last year.   {ACTIONS;DENIES/REPORTS:21021675::"Denies"} history of {urologic concerns:24757}  Pelvic Organ Prolapse Symptoms:                  She {denies/ admits to:24761} a feeling of a bulge the vaginal area. It has been present for {NUMBER 1-10:22536} {days/wks/mos/yrs:310907}.  She {denies/ admits to:24761} seeing a bulge.  This bulge {ACTION; IS/IS GI:087931 bothersome.  Bowel Symptom: Bowel movements: *** time(s) per {Time; day/week/month:13537} Stool consistency: {stool consistency:24758} Straining: {yes/no:19897}.  Splinting: {yes/no:19897}.  Incomplete evacuation: {yes/no:19897}.  She {denies/ admits to:24761} accidental bowel leakage / fecal incontinence  Occurs: *** time(s) per {Time; day/week/month:13537}  Consistency with leakage: {stool consistency:24758} Bowel regimen: {bowel regimen:24759} Last colonoscopy: Date ***,  Results ***  Sexual Function Sexually active: {yes/no:19897}.  Sexual orientation: {Sexual [phone]} Pain with sex: {pain with sex:24762}  Pelvic Pain {denies/ admits to:24761} pelvic pain Location: *** Pain occurs: *** Prior pain treatment: *** Improved by: *** Worsened by: ***   Past Medical History:  Past Medical History:  Diagnosis Date   Anxiety    Chlamydia 2018, 2021   Chronic kidney disease    kidney stones and kidney infection with last pregnancy   Depression    "gets in her moods, currently ok'   Gonorrhea 2018   Headache    Incisional hernia 04/2013   Infection    UTI   LGSIL on Pap smear of cervix 2019   Ovarian cyst    Urethral diverticulum      Past Surgical History:   Past Surgical History:  Procedure Laterality Date   CHOLECYSTECTOMY  04/03/2012   Procedure: LAPAROSCOPIC CHOLECYSTECTOMY WITH INTRAOPERATIVE CHOLANGIOGRAM;  Surgeon: Earnstine Regal, MD;  Location: [facility];  Service: General;  Laterality: N/A;   ERCP  03/12/2012   Procedure: ENDOSCOPIC RETROGRADE CHOLANGIOPANCREATOGRAPHY (ERCP);  Surgeon: Jeryl Columbia, MD;  Location: [facility];  Service: Endoscopy;  Laterality: N/A;   INGUINAL HERNIA REPAIR N/A 05/15/2013   Procedure: HERNIA REPAIR INCISIONAL;  Surgeon: Earnstine Regal, MD;  Location: [facility];  Service: General;  Laterality: N/A;   INSERTION OF MESH N/A 05/15/2013   Procedure: INSERTION OF MESH;  Surgeon: Earnstine Regal, MD;  Location: [facility];  Service: General;  Laterality: N/A;   URETERAL STENT PLACEMENT     WISDOM TOOTH EXTRACTION       Past OB/GYN History: G{NUMBERS 1-10:18281} P{NUMBERS 1-10:18281} Vaginal deliveries: ***,  Forceps/ Vacuum deliveries: ***, Cesarean section: *** Menopausal: {menopausal:24763} Contraception: ***. Last pap  smear was ***.  Any history of abnormal pap smears: {yes/no:19897}.   Medications: She has a current medication list which includes the following  prescription(s): albuterol, cyclobenzaprine, fluoxetine, fluticasone, levocetirizine, methylprednisolone, naproxen, omeprazole, vitafol ultra, and sumatriptan.   Allergies: Patient is allergic to penicillins.   Social History:  Social History   Tobacco Use   Smoking status: Never    Passive exposure: Never   Smokeless tobacco: Never  Vaping Use   Vaping Use: Never used  Substance Use Topics   Alcohol use: Not Currently    Comment: last drink 2 yrs ago   Drug use: No    Relationship status: {relationship status:24764} She lives with ***.   She {ACTION; IS/IS VOH:60737106} employed ***. Regular exercise: {Yes/No:304960894} History of abuse: {Yes/No:304960894}  Family History:   Family History  Problem Relation Age of Onset   Hypertension Mother    Hypertension Father    Diabetes Paternal Grandmother    Lung cancer Paternal Grandfather        smoked   Cancer Paternal Grandfather    Seizures Paternal Grandfather    Breast cancer Maternal Aunt      Review of Systems: ROS   OBJECTIVE Physical Exam: There were no vitals filed for this visit.  Physical Exam   GU / Detailed Urogynecologic Evaluation:  Pelvic Exam: Normal external female genitalia; Bartholin's and Skene's glands normal in appearance; urethral meatus normal in appearance, no urethral masses or discharge.   CST: {gen negative/positive:315881}  Reflexes: bulbocavernosis {DESC; PRESENT/NOT PRESENT:21021351}, anocutaneous {DESC; PRESENT/NOT PRESENT:21021351} ***bilaterally.  Speculum exam reveals normal vaginal mucosa {With/Without:20273} atrophy. Cervix {exam; gyn cervix:30847}. Uterus {exam; pelvic uterus:30849}. Adnexa {exam; adnexa:12223}.    s/p hysterectomy: Speculum exam reveals normal vaginal mucosa {With/Without:20273}  atrophy and normal vaginal cuff.  Adnexa {exam; adnexa:12223}.    With apex supported, anterior compartment defect was {reduced:24765}  Pelvic floor strength {Roman #  I-V:19040}/V, puborectalis {Roman # I-V:19040}/V external anal sphincter {Roman # I-V:19040}/V  Pelvic floor musculature: Right levator {Tender/Non-tender:20250}, Right obturator {Tender/Non-tender:20250}, Left levator {Tender/Non-tender:20250}, Left obturator {Tender/Non-tender:20250}  POP-Q:   POP-Q                                               Aa                                               Ba                                                 C                                                Gh                                               Pb  tvl                                                Ap                                               Bp                                                 D     Rectal Exam:  Normal sphincter tone, {rectocele:24766} distal rectocele, enterocoele {DESC; PRESENT/NOT PRESENT:21021351}, no rectal masses, {sign of:24767} dyssynergia when asking the patient to bear down.  Post-Void Residual (PVR) by Bladder Scan: In order to evaluate bladder emptying, we discussed obtaining a postvoid residual and she agreed to this procedure.  Procedure: The ultrasound unit was placed on the patient's abdomen in the suprapubic region after the patient had voided. A PVR of *** ml was obtained by bladder scan.  Laboratory Results: @ENCLABS @   ***I visualized the urine specimen, noting the specimen to be {urine color:24768}  ASSESSMENT AND PLAN Ms. Obeirne is a 26 y.o. with: No diagnosis found.    30, MD   Medical Decision Making:  - Reviewed/ ordered a clinical laboratory test - Reviewed/ ordered a radiologic study - Reviewed/ ordered medicine test - Decision to obtain old records - Discussion of management of or test interpretation with an external physician / other healthcare professional  - Assessment requiring independent historian - Review and summation of prior records -  Independent review of image, tracing or specimen

## 2022-04-11 ENCOUNTER — Ambulatory Visit (INDEPENDENT_AMBULATORY_CARE_PROVIDER_SITE_OTHER): Payer: Medicaid Other | Admitting: Obstetrics and Gynecology

## 2022-04-11 ENCOUNTER — Encounter: Payer: Self-pay | Admitting: Obstetrics and Gynecology

## 2022-04-11 ENCOUNTER — Encounter: Payer: Self-pay | Admitting: Family

## 2022-04-11 ENCOUNTER — Other Ambulatory Visit (HOSPITAL_COMMUNITY)
Admission: RE | Admit: 2022-04-11 | Discharge: 2022-04-11 | Disposition: A | Discharge status: Home / Self Care | Payer: Medicaid Other | Attending: Obstetrics and Gynecology | Admitting: Obstetrics and Gynecology

## 2022-04-11 VITALS — BP 118/78 | HR 82 | Ht 63.0 in | Wt 271.2 lb

## 2022-04-11 DIAGNOSIS — N361 Urethral diverticulum: Secondary | ICD-10-CM | POA: Diagnosis not present

## 2022-04-11 DIAGNOSIS — R350 Frequency of micturition: Secondary | ICD-10-CM | POA: Diagnosis not present

## 2022-04-11 DIAGNOSIS — R82998 Other abnormal findings in urine: Secondary | ICD-10-CM | POA: Insufficient documentation

## 2022-04-11 DIAGNOSIS — N393 Stress incontinence (female) (male): Secondary | ICD-10-CM | POA: Diagnosis not present

## 2022-04-11 DIAGNOSIS — N3281 Overactive bladder: Secondary | ICD-10-CM

## 2022-04-11 DIAGNOSIS — F419 Anxiety disorder, unspecified: Secondary | ICD-10-CM

## 2022-04-11 LAB — POCT URINALYSIS DIPSTICK
Appearance: ABNORMAL
Bilirubin, UA: NEGATIVE
Glucose, UA: POSITIVE — AB
Ketones, UA: NEGATIVE
Nitrite, UA: POSITIVE
Protein, UA: POSITIVE — AB
Spec Grav, UA: 1.025 (ref 1.010–1.025)
Urobilinogen, UA: 0.2 E.U./dL
pH, UA: 6 (ref 5.0–8.0)

## 2022-04-11 MED ORDER — SOLIFENACIN SUCCINATE 5 MG PO TABS: 5.0000 mg | ORAL_TABLET | Freq: Every day | ORAL | 5 refills | Status: DC

## 2022-04-11 NOTE — Patient Instructions (Signed)
Constipation: Our goal is to achieve formed bowel movements daily or every-other-day.  You may need to try different combinations of the following options to find what works best for you - everybody's body works differently so feel free to adjust the dosages as needed.  Some options to help maintain bowel health include:  Dietary changes (more leafy greens, vegetables and fruits; less processed foods) Fiber supplementation (Benefiber, FiberCon, Metamucil or Psyllium). Start slow and increase gradually to full dose. Over-the-counter agents such as: stool softeners (Docusate or Colace) and/or laxatives (Miralax, milk of magnesia)  "Power Pudding" is a natural mixture that may help your constipation.  To make blend 1 cup applesauce, 1 cup wheat bran, and 3/4 cup prune juice, refrigerate and then take 1 tablespoon daily with a large glass of water as needed.  

## 2022-04-12 LAB — URINE CULTURE: Culture: 40000 — AB

## 2022-04-13 MED ORDER — DIAZEPAM 5 MG PO TABS: ORAL_TABLET | ORAL | 0 refills | Status: DC

## 2022-04-13 NOTE — Addendum Note (Signed)
Addended by: Marguerita Beards on: 04/13/2022 04:12 PM   Modules accepted: Orders

## 2022-04-17 ENCOUNTER — Ambulatory Visit (HOSPITAL_COMMUNITY): Payer: Medicaid Other

## 2022-04-17 NOTE — Progress Notes (Unsigned)
PA for MRI with pelvis was submitted on 04/14/22 through [facility] ph# [phone] Tracking Number [id number] --- Called [facility] today to f/u on pending MRI. When I called the rep said PA for MRI of the ABD was submitted. I asked the pre that this PA be deleted an resubmitted as MRI of the pelvis with and without contrast.  CPT code 23343 was resubmitted  New tracking number is [id number] PA was clinically approved my the RN at [facility].  Awaiting final approval ---  PA WAS DENIED

## 2022-04-18 ENCOUNTER — Ambulatory Visit: Payer: Medicaid Other | Admitting: Physician Assistant

## 2022-04-21 NOTE — Progress Notes (Unsigned)
PA for MRI with pelvis was submitted on 04/21/22 through [facility] ph# [phone] Tracking Number [id number] Call Ref #: [id number] --- Called [facility] today to f/u on pending MRI. When I called the rep said PA for MRI of the ABD was submitted. I asked the pre that this PA be deleted an resubmitted as MRI of the pelvis with and without contrast.  Visit notes have been faxed to [phone]  OUTCOME: PENDING

## 2022-04-26 ENCOUNTER — Other Ambulatory Visit: Payer: Self-pay

## 2022-04-26 DIAGNOSIS — N361 Urethral diverticulum: Secondary | ICD-10-CM

## 2022-04-26 NOTE — Progress Notes (Signed)
CMET and urine culture need prior MRI per [employer])

## 2022-04-27 ENCOUNTER — Telehealth: Payer: Self-pay

## 2022-04-27 DIAGNOSIS — N361 Urethral diverticulum: Secondary | ICD-10-CM

## 2022-04-27 DIAGNOSIS — R350 Frequency of micturition: Secondary | ICD-10-CM

## 2022-04-27 NOTE — Addendum Note (Signed)
Addended by: Salina April on: 04/27/2022 03:23 PM   Modules accepted: Orders

## 2022-04-27 NOTE — Telephone Encounter (Signed)
Spoke to the patient. She will pick up the order in 1 hr and get her labs done today

## 2022-04-27 NOTE — Telephone Encounter (Addendum)
Attempt made to contact [name] is a 26 y.o. female re: message from [employer] for additional clinical information:  -Urine culture -Chemistry profile.  Labs have been ordered. Pt need lab order forms to get labs done Pt was not available.  Left a detailed message on her VM for the patient to call me back to pick up the orders and get labs done downstairs so we get her her Pelvic MRI approved.   --- [facility] Tracking number [id number]

## 2022-04-28 ENCOUNTER — Ambulatory Visit: Payer: Medicaid Other

## 2022-04-28 DIAGNOSIS — N361 Urethral diverticulum: Secondary | ICD-10-CM

## 2022-04-28 LAB — COMPREHENSIVE METABOLIC PANEL
ALT: 23 IU/L (ref 0–32)
AST: 20 IU/L (ref 0–40)
Albumin/Globulin Ratio: 1.7 (ref 1.2–2.2)
Albumin: 4.5 g/dL (ref 3.9–5.0)
Alkaline Phosphatase: 104 IU/L (ref 44–121)
BUN/Creatinine Ratio: 21 (ref 9–23)
BUN: 13 mg/dL (ref 6–20)
Bilirubin Total: 0.2 mg/dL (ref 0.0–1.2)
CO2: 17 mmol/L — ABNORMAL LOW (ref 20–29)
Calcium: 9.2 mg/dL (ref 8.7–10.2)
Chloride: 106 mmol/L (ref 96–106)
Creatinine, Ser: 0.62 mg/dL (ref 0.57–1.00)
Globulin, Total: 2.7 g/dL (ref 1.5–4.5)
Glucose: 92 mg/dL (ref 70–99)
Potassium: 4.5 mmol/L (ref 3.5–5.2)
Sodium: 138 mmol/L (ref 134–144)
Total Protein: 7.2 g/dL (ref 6.0–8.5)
eGFR: 126 mL/min/{1.73_m2} (ref >59)

## 2022-04-28 NOTE — Progress Notes (Signed)
[employer] [facility] (the company that reviews Imaging requests) are requesting that you have labs done before they will approve the Pelvic MRI. I have order the urine culture and chemistry panel.

## 2022-04-30 ENCOUNTER — Ambulatory Visit (HOSPITAL_COMMUNITY): Payer: Medicaid Other

## 2022-04-30 LAB — URINE CULTURE

## 2022-05-01 NOTE — Progress Notes (Signed)
[facility] forms, Culture results and CMP have been faxed faxed to [facility]

## 2022-05-14 ENCOUNTER — Telehealth: Payer: Medicaid Other | Admitting: Physician Assistant

## 2022-05-14 ENCOUNTER — Ambulatory Visit (HOSPITAL_COMMUNITY): Payer: Medicaid Other

## 2022-05-14 DIAGNOSIS — M545 Low back pain, unspecified: Secondary | ICD-10-CM | POA: Diagnosis not present

## 2022-05-14 DIAGNOSIS — G8929 Other chronic pain: Secondary | ICD-10-CM | POA: Diagnosis not present

## 2022-05-15 MED ORDER — ETODOLAC 300 MG PO CAPS: 300.0000 mg | ORAL_CAPSULE | Freq: Three times a day (TID) | ORAL | 0 refills | Status: DC

## 2022-05-15 MED ORDER — IBUPROFEN 600 MG PO TABS: 600.0000 mg | ORAL_TABLET | Freq: Three times a day (TID) | ORAL | 0 refills | Status: DC | PRN

## 2022-05-15 MED ORDER — CYCLOBENZAPRINE HCL 10 MG PO TABS: 10.0000 mg | ORAL_TABLET | Freq: Three times a day (TID) | ORAL | 0 refills | Status: DC | PRN

## 2022-05-15 NOTE — Addendum Note (Signed)
Addended by: Margaretann Loveless on: 05/15/2022 10:10 AM   Modules accepted: Orders

## 2022-05-15 NOTE — Progress Notes (Signed)

## 2022-05-16 ENCOUNTER — Other Ambulatory Visit: Payer: Self-pay | Admitting: Obstetrics and Gynecology

## 2022-05-16 DIAGNOSIS — N361 Urethral diverticulum: Secondary | ICD-10-CM

## 2022-05-22 ENCOUNTER — Ambulatory Visit (HOSPITAL_COMMUNITY): Payer: Medicaid Other

## 2022-05-23 ENCOUNTER — Ambulatory Visit: Payer: Medicaid Other | Admitting: Obstetrics and Gynecology

## 2022-05-28 ENCOUNTER — Telehealth: Payer: Medicaid Other | Admitting: Physician Assistant

## 2022-05-28 DIAGNOSIS — R3989 Other symptoms and signs involving the genitourinary system: Secondary | ICD-10-CM | POA: Diagnosis not present

## 2022-05-29 MED ORDER — SULFAMETHOXAZOLE-TRIMETHOPRIM 800-160 MG PO TABS: 1.0000 | ORAL_TABLET | Freq: Two times a day (BID) | ORAL | 0 refills | Status: DC

## 2022-05-29 NOTE — Progress Notes (Signed)

## 2022-05-30 ENCOUNTER — Ambulatory Visit (HOSPITAL_COMMUNITY): Payer: Medicaid Other

## 2022-06-01 ENCOUNTER — Ambulatory Visit: Payer: Medicaid Other | Admitting: Obstetrics and Gynecology

## 2022-06-05 ENCOUNTER — Ambulatory Visit
Admission: RE | Admit: 2022-06-05 | Discharge: 2022-06-05 | Disposition: A | Discharge status: Home / Self Care | Payer: Medicaid Other | Source: Ambulatory Visit | Attending: Physician Assistant | Admitting: Physician Assistant

## 2022-06-05 VITALS — BP 129/88 | HR 124 | Temp 99.1°F | Resp 97

## 2022-06-05 DIAGNOSIS — Z20822 Contact with and (suspected) exposure to covid-19: Secondary | ICD-10-CM | POA: Diagnosis not present

## 2022-06-05 DIAGNOSIS — J069 Acute upper respiratory infection, unspecified: Secondary | ICD-10-CM | POA: Insufficient documentation

## 2022-06-05 LAB — POCT RAPID STREP A (OFFICE): Rapid Strep A Screen: NEGATIVE

## 2022-06-05 NOTE — ED Triage Notes (Signed)
[facility] presents to [facility] with co of ha, sore throat, generalized body aches and chills since last night. Pt reports tylenol for symptoms

## 2022-06-05 NOTE — ED Provider Notes (Signed)
[facility]    CSN: [id number] Arrival date & time: 06/05/22  1141      History   Chief Complaint Chief Complaint  Patient presents with   Sore Throat    I have body chills, up and down temperatures, headache, sore throat body aches - Entered by patient    HPI [place] Soyla Bainter is a 26 y.o. female.   Patient here today for evaluation of headache, sore throat, generalized body aches and chills that started last night.  She has tried Tylenol without significant relief.  She has had some cough, specifically when she lies down.  She does not report any other symptoms.  The history is provided by the patient.  Sore Throat Pertinent negatives include no abdominal pain and no shortness of breath.    Past Medical History:  Diagnosis Date   Anxiety    Chlamydia 2018, 2021   Chronic kidney disease    kidney stones and kidney infection with last pregnancy   Depression    "gets in her moods, currently ok'   Gonorrhea 2018   Headache    Incisional hernia 04/2013   Infection    UTI   LGSIL on Pap smear of cervix 2019   Ovarian cyst    Urethral diverticulum     Patient Active Problem List   Diagnosis Date Noted   Low back pain 02/24/2022   Bacterial vaginitis 12/30/2021   Gestational hypertension 09/02/2020   Elevated liver enzymes 06/09/2020   BMI 37.0-37.9, adult 05/04/2020   History of gestational hypertension 04/21/2020   History of preterm delivery 04/06/2020    Past Surgical History:  Procedure Laterality Date   CHOLECYSTECTOMY  04/03/2012   Procedure: LAPAROSCOPIC CHOLECYSTECTOMY WITH INTRAOPERATIVE CHOLANGIOGRAM;  Surgeon: Velora Heckler, MD;  Location: [facility];  Service: General;  Laterality: N/A;   ERCP  03/12/2012   Procedure: ENDOSCOPIC RETROGRADE CHOLANGIOPANCREATOGRAPHY (ERCP);  Surgeon: Petra Kuba, MD;  Location: [facility] OR;  Service: Endoscopy;  Laterality: N/A;   INGUINAL HERNIA REPAIR N/A 05/15/2013   Procedure: HERNIA REPAIR INCISIONAL;   Surgeon: Velora Heckler, MD;  Location: [facility];  Service: General;  Laterality: N/A;   INSERTION OF MESH N/A 05/15/2013   Procedure: INSERTION OF MESH;  Surgeon: Velora Heckler, MD;  Location: [facility];  Service: General;  Laterality: N/A;   URETERAL STENT PLACEMENT     WISDOM TOOTH EXTRACTION      OB History     Gravida  9   Para  5   Term  2   Preterm  3   AB  4   Living  5      SAB  3   IAB  0   Ectopic  0   Multiple  0   Live Births  5            Home Medications    Prior to Admission medications   Medication Sig Start Date End Date Taking? Authorizing Provider  cyclobenzaprine (FLEXERIL) 10 MG tablet Take 1 tablet (10 mg total) by mouth 3 (three) times daily as needed for muscle spasms. Patient not taking: Reported on 06/05/2022 05/15/22   Margaretann Loveless, PA-C  diazepam (VALIUM) 5 MG tablet Place 1 tablet by mouth 30 minutes prior to procedure. Patient not taking: Reported on 06/05/2022 04/13/22   Marguerita Beards, MD  ibuprofen (ADVIL) 600 MG tablet Take 1 tablet (600 mg total) by mouth every 8 (eight) hours as  needed. 05/15/22   Margaretann Loveless, PA-C  solifenacin (VESICARE) 5 MG tablet Take 1 tablet (5 mg total) by mouth daily. 04/11/22   Marguerita Beards, MD  sulfamethoxazole-trimethoprim (BACTRIM DS) 800-160 MG tablet Take 1 tablet by mouth 2 (two) times daily. 05/29/22   Margaretann Loveless, PA-C  SUMAtriptan (IMITREX) 25 MG tablet Take 25 mg (1 tablet total) by mouth at the start of the headache. May repeat in 2 hours x 1 if headache persists. Max of 2 tablets/24 hours. 03/13/22   Rema Fendt, NP    Family History Family History  Problem Relation Age of Onset   Hypertension Mother    Hypertension Father    Diabetes Paternal Grandmother    Lung cancer Paternal Grandfather        smoked   Cancer Paternal Grandfather    Seizures Paternal Grandfather    Breast cancer Maternal Aunt     Social  History Social History   Tobacco Use   Smoking status: Never    Passive exposure: Never   Smokeless tobacco: Never  Vaping Use   Vaping Use: Never used  Substance Use Topics   Alcohol use: Not Currently    Comment: last drink 2 yrs ago   Drug use: No     Allergies   Penicillins   Review of Systems Review of Systems  Constitutional:  Positive for chills. Negative for fever.  HENT:  Positive for congestion and sore throat. Negative for ear pain.   Eyes:  Negative for discharge and redness.  Respiratory:  Positive for cough. Negative for shortness of breath and wheezing.   Gastrointestinal:  Negative for abdominal pain, diarrhea, nausea and vomiting.  Musculoskeletal:  Positive for myalgias.     Physical Exam Triage Vital Signs ED Triage Vitals  Enc Vitals Group     BP 06/05/22 1204 129/88     Pulse Rate 06/05/22 1204 (!) 124     Resp 06/05/22 1204 (!) 97     Temp 06/05/22 1204 99.1 F (37.3 C)     Temp src --      SpO2 06/05/22 1204 98 %     Weight --      Height --      Head Circumference --      Peak Flow --      Pain Score 06/05/22 1203 8     Pain Loc --      Pain Edu? --      Excl. in GC? --    No data found.  Updated Vital Signs BP 129/88   Pulse (!) 124   Temp 99.1 F (37.3 C)   Resp (!) 97   LMP 12/05/2021 (Exact Date)   SpO2 98%   Physical Exam Vitals and nursing note reviewed.  Constitutional:      General: She is not in acute distress.    Appearance: Normal appearance. She is not ill-appearing.  HENT:     Head: Normocephalic and atraumatic.     Nose: Congestion present. No rhinorrhea.     Mouth/Throat:     Mouth: Mucous membranes are moist.     Pharynx: Oropharynx is clear. Posterior oropharyngeal erythema present. No oropharyngeal exudate.  Eyes:     Conjunctiva/sclera: Conjunctivae normal.  Cardiovascular:     Rate and Rhythm: Normal rate and regular rhythm.     Heart sounds: Normal heart sounds. No murmur heard. Pulmonary:      Effort: Pulmonary effort is normal. No respiratory distress.  Breath sounds: Normal breath sounds. No wheezing, rhonchi or rales.  Neurological:     Mental Status: She is alert.  Psychiatric:        Mood and Affect: Mood normal.        Behavior: Behavior normal.        Thought Content: Thought content normal.      UC Treatments / Results  Labs (all labs ordered are listed, but only abnormal results are displayed) Labs Reviewed  POCT RAPID STREP A (OFFICE) - Normal  SARS CORONAVIRUS 2 (TAT 6-24 HRS)  CULTURE, GROUP A STREP [facility])    EKG   Radiology No results found.  Procedures Procedures (including critical care time)  Medications Ordered in UC Medications - No data to display  Initial Impression / Assessment and Plan / UC Course  I have reviewed the triage vital signs and the nursing notes.  Pertinent labs & imaging results that were available during my care of the patient were reviewed by me and considered in my medical decision making (see chart for details).    Strep test negative in office.  Will order culture as well as COVID screening.  Suspect likely viral etiology of symptoms and recommended symptomatic treatment.  Encouraged follow-up with any further concerns.  Final Clinical Impressions(s) / UC Diagnoses   Final diagnoses:  Acute upper respiratory infection   Discharge Instructions   None    ED Prescriptions   None    PDMP not reviewed this encounter.   Tomi Bamberger, PA-C 06/05/22 1259

## 2022-06-06 LAB — SARS CORONAVIRUS 2 (TAT 6-24 HRS): SARS Coronavirus 2: NEGATIVE

## 2022-06-08 LAB — CULTURE, GROUP A STREP (THRC)

## 2022-06-19 NOTE — Therapy (Deleted)
OUTPATIENT PHYSICAL THERAPY FEMALE PELVIC EVALUATION   Patient Name: [name] MRN: [id number] DOB:[birth year], 26 y.o., female Today's Date: 06/19/2022    Past Medical History:  Diagnosis Date   Anxiety    Chlamydia 2018, 2021   Chronic kidney disease    kidney stones and kidney infection with last pregnancy   Depression    "gets in her moods, currently ok'   Gonorrhea 2018   Headache    Incisional hernia 04/2013   Infection    UTI   LGSIL on Pap smear of cervix 2019   Ovarian cyst    Urethral diverticulum    Past Surgical History:  Procedure Laterality Date   CHOLECYSTECTOMY  04/03/2012   Procedure: LAPAROSCOPIC CHOLECYSTECTOMY WITH INTRAOPERATIVE CHOLANGIOGRAM;  Surgeon: Velora Heckler, MD;  Location: [facility];  Service: General;  Laterality: N/A;   ERCP  03/12/2012   Procedure: ENDOSCOPIC RETROGRADE CHOLANGIOPANCREATOGRAPHY (ERCP);  Surgeon: Petra Kuba, MD;  Location: [facility] OR;  Service: Endoscopy;  Laterality: N/A;   INGUINAL HERNIA REPAIR N/A 05/15/2013   Procedure: HERNIA REPAIR INCISIONAL;  Surgeon: Velora Heckler, MD;  Location: [facility];  Service: General;  Laterality: N/A;   INSERTION OF MESH N/A 05/15/2013   Procedure: INSERTION OF MESH;  Surgeon: Velora Heckler, MD;  Location: [facility];  Service: General;  Laterality: N/A;   URETERAL STENT PLACEMENT     WISDOM TOOTH EXTRACTION     Patient Active Problem List   Diagnosis Date Noted   Low back pain 02/24/2022   Bacterial vaginitis 12/30/2021   Gestational hypertension 09/02/2020   Elevated liver enzymes 06/09/2020   BMI 37.0-37.9, adult 05/04/2020   History of gestational hypertension 04/21/2020   History of preterm delivery 04/06/2020    PCP: Rema Fendt, NP  REFERRING PROVIDER: Marguerita Beards, MD  REFERRING DIAG:  N32.81 (ICD-10-CM) - Overactive bladder  N39.3 (ICD-10-CM) - SUI (stress urinary incontinence, female)    THERAPY DIAG:  No diagnosis  found.  Rationale for Evaluation and Treatment Rehabilitation  ONSET DATE: ***  SUBJECTIVE:                                                                                                                                                                                           SUBJECTIVE STATEMENT: *** Fluid intake: {Yes/No:304960894}    PAIN:  Are you having pain? {yes/no:20286} NPRS scale: ***/10 Pain location: {pelvic pain location:27098}  Pain type: {type:313116} Pain description: {PAIN DESCRIPTION:21022940}   Aggravating factors: *** Relieving factors: ***  PRECAUTIONS: {Therapy precautions:24002}  WEIGHT BEARING RESTRICTIONS {Yes ***/No:24003}  FALLS:  Has patient fallen  in last 6 months? {fallsyesno:27318}  LIVING ENVIRONMENT: Lives with: {OPRC lives with:25569::"lives with their family"} Lives in: {Lives in:25570} Stairs: {opstairs:27293} Has following equipment at home: {Assistive devices:23999}  OCCUPATION: ***  PLOF: {PLOF:24004}  PATIENT GOALS ***  PERTINENT HISTORY:  Urethral diverticulum; chlamydia, inguinal hernia repair 7/24/202014 Sexual abuse: {Yes/No:304960894}  BOWEL MOVEMENT Pain with bowel movement: {yes/no:20286} Type of bowel movement:{PT BM type:27100} Fully empty rectum: {Yes/No:304960894} Leakage: Yes: happens 1 time per month and is liquid Pads: {Yes/No:304960894} Fiber supplement: {Yes/No:304960894}  URINATION Pain with urination: {yes/no:20286} Fully empty bladder: {Yes/No:304960894} Stream: {PT urination:27102}push on her belly or vagina to empty bladder Urgency: {Yes/No:304960894} Frequency: daytime voids 4; Nocturia 2 Leakage: Walking to the bathroom, Coughing, Sneezing, Laughing, Exercise, Lifting, and while asleep, with urgency, without sensation Pads: Yes: 6-8  wears depends  INTERCOURSE Pain with intercourse: {pain with intercourse PA:27099}same sex  Ability to have vaginal penetration:  {Yes/No:304960894} Climax:  *** Marinoff Scale: ***/3  PREGNANCY Vaginal deliveries *** Tearing {Yes***/No:304960894} C-section deliveries *** Currently pregnant {Yes***/No:304960894}  PROLAPSE {PT prolapse:27101}    OBJECTIVE:   DIAGNOSTIC FINDINGS:  Pelvic floor strength I/V, puborectalis II/V external anal sphincter II/V; A PVR of 28 ml was obtained by bladder scan.  PATIENT SURVEYS:  {rehab surveys:24030}  PFIQ-7 ***  COGNITION:  Overall cognitive status: {cognition:24006}     SENSATION:  Light touch: {intact/deficits:24005}  Proprioception: {intact/deficits:24005}  MUSCLE LENGTH: Hamstrings: Right *** deg; Left *** deg Thomas test: Right *** deg; Left *** deg  LUMBAR SPECIAL TESTS:  {lumbar special test:25242}  FUNCTIONAL TESTS:  {Functional tests:24029}  GAIT: Distance walked: *** Assistive device utilized: {Assistive devices:23999} Level of assistance: {Levels of assistance:24026} Comments: ***               POSTURE: {posture:25561}   PELVIC ALIGNMENT:  LUMBARAROM/PROM  A/PROM A/PROM  eval  Flexion   Extension   Right lateral flexion   Left lateral flexion   Right rotation   Left rotation    (Blank rows = not tested)  LOWER EXTREMITY ROM:  {AROM/PROM:27142} ROM Right eval Left eval  Hip flexion    Hip extension    Hip abduction    Hip adduction    Hip internal rotation    Hip external rotation    Knee flexion    Knee extension    Ankle dorsiflexion    Ankle plantarflexion    Ankle inversion    Ankle eversion     (Blank rows = not tested)  LOWER EXTREMITY MMT:  MMT Right eval Left eval  Hip flexion    Hip extension    Hip abduction    Hip adduction    Hip internal rotation    Hip external rotation    Knee flexion    Knee extension    Ankle dorsiflexion    Ankle plantarflexion    Ankle inversion    Ankle eversion      PALPATION:   General  ***                External Perineal Exam ***                             Internal Pelvic Floor  ***  Patient confirms identification and approves PT to assess internal pelvic floor and treatment {yes/no:20286}  PELVIC MMT:   MMT eval  Vaginal   Internal Anal Sphincter   External Anal Sphincter   Puborectalis   Diastasis Recti   (  Blank rows = not tested)        TONE: ***  PROLAPSE: ***  TODAY'S TREATMENT  EVAL ***   PATIENT EDUCATION:  Education details: *** Person educated: {Person educated:25204} Education method: {Education Method:25205} Education comprehension: {Education Comprehension:25206}   HOME EXERCISE PROGRAM: ***  ASSESSMENT:  CLINICAL IMPRESSION: Patient is a *** y.o. *** who was seen today for physical therapy evaluation and treatment for ***.    OBJECTIVE IMPAIRMENTS {opptimpairments:25111}.   ACTIVITY LIMITATIONS {activitylimitations:27494}  PARTICIPATION LIMITATIONS: {participationrestrictions:25113}  PERSONAL FACTORS {Personal factors:25162} are also affecting patient's functional outcome.   REHAB POTENTIAL: {rehabpotential:25112}  CLINICAL DECISION MAKING: {clinical decision making:25114}  EVALUATION COMPLEXITY: {Evaluation complexity:25115}   GOALS: Goals reviewed with patient? {yes/no:20286}  SHORT TERM GOALS: Target date: {follow up:25551}  *** Baseline: Goal status: {GOALSTATUS:25110}  2.  *** Baseline:  Goal status: {GOALSTATUS:25110}  3.  *** Baseline:  Goal status: {GOALSTATUS:25110}  4.  *** Baseline:  Goal status: {GOALSTATUS:25110}  5.  *** Baseline:  Goal status: {GOALSTATUS:25110}  6.  *** Baseline:  Goal status: {GOALSTATUS:25110}  LONG TERM GOALS: Target date: {follow up:25551}   *** Baseline:  Goal status: {GOALSTATUS:25110}  2.  *** Baseline:  Goal status: {GOALSTATUS:25110}  3.  *** Baseline:  Goal status: {GOALSTATUS:25110}  4.  *** Baseline:  Goal status: {GOALSTATUS:25110}  5.  *** Baseline:  Goal status: {GOALSTATUS:25110}  6.  *** Baseline:  Goal status:  {GOALSTATUS:25110}  PLAN: PT FREQUENCY: {rehab frequency:25116}  PT DURATION: {rehab duration:25117}  PLANNED INTERVENTIONS: {rehab planned interventions:25118::"Therapeutic exercises","Therapeutic activity","Neuromuscular re-education","Balance training","Gait training","Patient/Family education","Self Care","Joint mobilization"}  PLAN FOR NEXT SESSION: ***   Lorne Winkels, PT 06/19/2022, 8:23 AM

## 2022-06-20 ENCOUNTER — Encounter: Payer: Medicaid Other | Attending: Obstetrics and Gynecology | Admitting: Physical Therapy

## 2022-06-23 ENCOUNTER — Telehealth: Payer: Medicaid Other | Admitting: Physician Assistant

## 2022-06-23 DIAGNOSIS — M549 Dorsalgia, unspecified: Secondary | ICD-10-CM

## 2022-06-23 NOTE — Progress Notes (Signed)
Based on what you shared with me, I feel your condition warrants further evaluation as soon as possible at an Emergency department. If your pain is 10/10 and causing inability to move you need to be evaluated at an acute care facility. Unfortunately this is above the scope of what can be evaluated and managed via e-visit. If someone can carry you to the nearest ER, that is fine. Otherwise please call 911.    NOTE: There will be NO CHARGE for this eVisit   If you are having a true medical emergency please call 911.      Emergency Department-[facility]  Get Driving Directions  [phone]  [address]  [place], [place] [address]  Open 24/7/365      [facility] Emergency Department at [facility]  Get Driving Directions  [address]  [place], [place] [address]  Open 24/7/365    Emergency Department- [facility]  Get Driving Directions  [phone]  [address]  [place], [place] [address]  Open 24/7/365      [facility] at [facility]  Get Driving Directions  [phone]  [address]  [place], [place] [address]  Open 24/7/365    [facility]  Emergency Department- [facility]  Get Driving Directions  [phone]  [address]  [place], [place] [address]  Open 24/7/365    [place]  Emergency Department- [facility]  Get Driving Directions  [address]  [place], [place] [address]  Open 24/7/365    [facility]  Emergency Department- [facility]  Get Driving Directions  [phone]  [address]  [place], [place] [address]  Open 24/7/365

## 2022-06-27 ENCOUNTER — Encounter: Payer: Medicaid Other | Admitting: Physical Therapy

## 2022-07-02 ENCOUNTER — Telehealth: Payer: Medicaid Other | Admitting: Nurse Practitioner

## 2022-07-02 DIAGNOSIS — K6289 Other specified diseases of anus and rectum: Secondary | ICD-10-CM | POA: Diagnosis not present

## 2022-07-02 MED ORDER — HYDROCORTISONE ACETATE 25 MG RE SUPP: 25.0000 mg | Freq: Two times a day (BID) | RECTAL | 0 refills | Status: DC

## 2022-07-02 NOTE — Progress Notes (Signed)

## 2022-07-03 MED ORDER — HYDROCORTISONE ACETATE 25 MG RE SUPP: 25.0000 mg | Freq: Two times a day (BID) | RECTAL | 0 refills | Status: DC

## 2022-07-03 NOTE — Addendum Note (Signed)
Addended by: Margaretann Loveless on: 07/03/2022 06:09 PM   Modules accepted: Orders

## 2022-07-06 ENCOUNTER — Encounter: Payer: Medicaid Other | Admitting: Physical Therapy

## 2022-07-11 ENCOUNTER — Encounter: Payer: Medicaid Other | Admitting: Physical Therapy

## 2022-07-23 ENCOUNTER — Telehealth: Payer: Medicaid Other | Admitting: Physician Assistant

## 2022-07-23 DIAGNOSIS — J019 Acute sinusitis, unspecified: Secondary | ICD-10-CM

## 2022-07-23 DIAGNOSIS — B9789 Other viral agents as the cause of diseases classified elsewhere: Secondary | ICD-10-CM | POA: Diagnosis not present

## 2022-07-24 MED ORDER — IPRATROPIUM BROMIDE 0.03 % NA SOLN: 2.0000 | Freq: Two times a day (BID) | NASAL | 0 refills | Status: DC

## 2022-07-24 NOTE — Progress Notes (Signed)
E-Visit for Sinus Problems  We are sorry that you are not feeling well.  Here is how we plan to help!  Based on what you have shared with me it looks like you have sinusitis.  Sinusitis is inflammation and infection in the sinus cavities of the head.  Based on your presentation I believe you most likely have Acute Viral Sinusitis.This is an infection most likely caused by a virus. There is not specific treatment for viral sinusitis other than to help you with the symptoms until the infection runs its course.  You may use an oral decongestant such as Mucinex D or if you have glaucoma or high blood pressure use plain Mucinex. Saline nasal spray help and can safely be used as often as needed for congestion, I have prescribed: Ipratropium Bromide nasal spray 0.03% 2 sprays in eah nostril 2-3 times a day  Some authorities believe that zinc sprays or the use of Echinacea may shorten the course of your symptoms.  Sinus infections are not as easily transmitted as other respiratory infection, however we still recommend that you avoid close contact with loved ones, especially the very young and elderly.  Remember to wash your hands thoroughly throughout the day as this is the number one way to prevent the spread of infection!  Home Care: Only take medications as instructed by your medical team. Do not take these medications with alcohol. A steam or ultrasonic humidifier can help congestion.  You can place a towel over your head and breathe in the steam from hot water coming from a faucet. Avoid close contacts especially the very young and the elderly. Cover your mouth when you cough or sneeze. Always remember to wash your hands.  Get Help Right Away If: You develop worsening fever or sinus pain. You develop a severe head ache or visual changes. Your symptoms persist after you have completed your treatment plan.  Make sure you Understand these instructions. Will watch your condition. Will get help  right away if you are not doing well or get worse.   Thank you for choosing an e-visit.  Your e-visit answers were reviewed by a board certified advanced clinical practitioner to complete your personal care plan. Depending upon the condition, your plan could have included both over the counter or prescription medications.  Please review your pharmacy choice. Make sure the pharmacy is open so you can pick up prescription now. If there is a problem, you may contact your provider through MyChart messaging and have the prescription routed to another pharmacy.  Your safety is important to us. If you have drug allergies check your prescription carefully.   For the next 24 hours you can use MyChart to ask questions about today's visit, request a non-urgent call back, or ask for a work or school excuse. You will get an email in the next two days asking about your experience. I hope that your e-visit has been valuable and will speed your recovery.  I provided 5 minutes of non face-to-face time during this encounter for chart review and documentation.   

## 2022-08-13 ENCOUNTER — Telehealth: Payer: Medicaid Other | Admitting: Physician Assistant

## 2022-08-13 DIAGNOSIS — M545 Low back pain, unspecified: Secondary | ICD-10-CM

## 2022-08-14 ENCOUNTER — Encounter: Payer: Self-pay | Admitting: *Deleted

## 2022-08-14 NOTE — Progress Notes (Signed)
Because of ongoing issues with back pain, I feel your condition warrants further evaluation and I recommend that you be seen for a face to face visit.  Please contact your primary care physician practice to be seen. Many offices offer virtual options to be seen via video if you are not comfortable going in person to a medical facility at this time. If pain is truly remaining a 10/10, you need to be evaluated at nearest ER.   NOTE: You will NOT be charged for this eVisit.  If you do not have a PCP, [facility] offers a free physician referral service available at [phone]. Our trained staff has the experience, knowledge and resources to put you in touch with a physician who is right for you.    If you are having a true medical emergency please call 911.   Your e-visit answers were reviewed by a board certified advanced clinical practitioner to complete your personal care plan.  Thank you for using e-Visits.

## 2022-11-10 ENCOUNTER — Ambulatory Visit: Payer: Medicaid Other

## 2022-11-21 ENCOUNTER — Telehealth: Payer: Medicaid Other | Admitting: Physician Assistant

## 2022-11-21 DIAGNOSIS — B9689 Other specified bacterial agents as the cause of diseases classified elsewhere: Secondary | ICD-10-CM

## 2022-11-21 DIAGNOSIS — N760 Acute vaginitis: Secondary | ICD-10-CM | POA: Diagnosis not present

## 2022-11-21 MED ORDER — FLUCONAZOLE 150 MG PO TABS: ORAL_TABLET | ORAL | 0 refills | Status: DC

## 2022-11-21 MED ORDER — METRONIDAZOLE 500 MG PO TABS: 500.0000 mg | ORAL_TABLET | Freq: Two times a day (BID) | ORAL | 0 refills | Status: DC

## 2022-11-21 NOTE — Progress Notes (Signed)

## 2022-11-21 NOTE — Progress Notes (Signed)
I have spent 5 minutes in review of e-visit questionnaire, review and updating patient chart, medical decision making and response to patient.   Tatum Corl Cody Tyleigh Mahn, PA-C    

## 2022-11-21 NOTE — Addendum Note (Signed)
Addended by: Brunetta Jeans on: 11/21/2022 05:58 PM   Modules accepted: Orders

## 2022-11-27 ENCOUNTER — Telehealth: Payer: Medicaid Other | Admitting: Physician Assistant

## 2022-11-27 DIAGNOSIS — G43009 Migraine without aura, not intractable, without status migrainosus: Secondary | ICD-10-CM | POA: Diagnosis not present

## 2022-11-27 MED ORDER — RIZATRIPTAN BENZOATE 10 MG PO TABS: 10.0000 mg | ORAL_TABLET | ORAL | 0 refills | Status: DC | PRN

## 2022-11-27 NOTE — Progress Notes (Signed)
Virtual Visit Consent   [facility], you are scheduled for a virtual visit with a [facility] provider today. Just as with appointments in the office, your consent must be obtained to participate. Your consent will be active for this visit and any virtual visit you may have with one of our providers in the next 365 days. If you have a MyChart account, a copy of this consent can be sent to you electronically.  As this is a virtual visit, video technology does not allow for your provider to perform a traditional examination. This may limit your provider's ability to fully assess your condition. If your provider identifies any concerns that need to be evaluated in person or the need to arrange testing (such as labs, EKG, etc.), we will make arrangements to do so. Although advances in technology are sophisticated, we cannot ensure that it will always work on either your end or our end. If the connection with a video visit is poor, the visit may have to be switched to a telephone visit. With either a video or telephone visit, we are not always able to ensure that we have a secure connection.  By engaging in this virtual visit, you consent to the provision of healthcare and authorize for your insurance to be billed (if applicable) for the services provided during this visit. Depending on your insurance coverage, you may receive a charge related to this service.  I need to obtain your verbal consent now. Are you willing to proceed with your visit today? [name] has provided verbal consent on 11/27/2022 for a virtual visit (video or telephone). Mar Daring, PA-C  Date: 11/27/2022 4:22 PM  Virtual Visit via Video Note   I, Mar Daring, connected with  [name]  ([id number], [birth year]) on 11/27/22 at  4:15 PM EST by a video-enabled telemedicine application and verified that I am speaking with the correct person using two identifiers.  Location: Patient: Virtual  Visit Location Patient: Home Provider: Virtual Visit Location Provider: Home Office   I discussed the limitations of evaluation and management by telemedicine and the availability of in person appointments. The patient expressed understanding and agreed to proceed.    History of Present Illness: [name] is a 27 y.o. who identifies as a female who was assigned female at birth, and is being seen today for headache.  HPI: Headache  This is a new problem. The current episode started in the past 7 days (3 days). The problem occurs constantly. The problem has been unchanged. The pain is located in the Frontal (feels behind right eye and frontal region) region. The pain radiates to the face. The quality of the pain is described as aching. The pain is moderate. Pertinent negatives include no blurred vision, eye pain, eye redness, eye watering, fever, nausea, neck pain, numbness, phonophobia, photophobia, rhinorrhea, scalp tenderness, tinnitus or visual change. Associated symptoms comments: fatigue. The symptoms are aggravated by emotional stress. She has tried Excedrin, acetaminophen and NSAIDs for the symptoms. The treatment provided no relief. Her past medical history is significant for migraine headaches. There is no history of cluster headaches.     Problems:  Patient Active Problem List   Diagnosis Date Noted   Low back pain 02/24/2022   Bacterial vaginitis 12/30/2021   Gestational hypertension 09/02/2020   Elevated liver enzymes 06/09/2020   BMI 37.0-37.9, adult 05/04/2020   History of gestational hypertension 04/21/2020   History of preterm delivery 04/06/2020  Allergies:  Allergies  Allergen Reactions   Penicillins Anaphylaxis, Itching, Swelling, Rash and Other (See Comments)    Has patient had a PCN reaction causing immediate rash, facial/tongue/throat swelling, SOB or lightheadedness with hypotension: Yes Has patient had a PCN reaction causing severe rash involving mucus  membranes or skin necrosis: No Has patient had a PCN reaction that required hospitalization: No Has patient had a PCN reaction occurring within the last 10 years: No If all of the above answers are "NO", then may proceed with Cephalosporin use.  And swelling   Medications:  Current Outpatient Medications:    rizatriptan (MAXALT) 10 MG tablet, Take 1 tablet (10 mg total) by mouth as needed for migraine. May repeat in 2 hours if needed, Disp: 10 tablet, Rfl: 0   fluconazole (DIFLUCAN) 150 MG tablet, Take 1 tablet PO once. Repeat in 3 days if needed., Disp: 2 tablet, Rfl: 0   metroNIDAZOLE (FLAGYL) 500 MG tablet, Take 1 tablet (500 mg total) by mouth 2 (two) times daily., Disp: 14 tablet, Rfl: 0  Observations/Objective: Patient is well-developed, well-nourished in no acute distress.  Resting comfortably at home.  Head is normocephalic, atraumatic.  No labored breathing.  Speech is clear and coherent with logical content.  Patient is alert and oriented at baseline.    Assessment and Plan: 1. Migraine without aura and without status migrainosus, not intractable - rizatriptan (MAXALT) 10 MG tablet; Take 1 tablet (10 mg total) by mouth as needed for migraine. May repeat in 2 hours if needed  Dispense: 10 tablet; Refill: 0  - Suspect migraine type headache but has not found any truly effective treatments - Has tried and failed NSAIDs, excedrin, caffeine, muscle relaxer, sumatriptan, and steroid dose packs - Will try Rizatriptan - Discussed importance of following up in person with PCP or getting a referral to the headache clinic so that one person can follow what she has tried and failed and find the appropriate headache regimen for her, she voiced understanding - Follow up in person if headache worsens  Follow Up Instructions: I discussed the assessment and treatment plan with the patient. The patient was provided an opportunity to ask questions and all were answered. The patient agreed with  the plan and demonstrated an understanding of the instructions.  A copy of instructions were sent to the patient via MyChart unless otherwise noted below.    The patient was advised to call back or seek an in-person evaluation if the symptoms worsen or if the condition fails to improve as anticipated.  Time:  I spent 11 minutes with the patient via telehealth technology discussing the above problems/concerns.    Mar Daring, PA-C

## 2022-11-27 NOTE — Patient Instructions (Signed)
[place], thank you for joining Mar Daring, PA-C for today's virtual visit.  While this provider is not your primary care provider (PCP), if your PCP is located in our provider database this encounter information will be shared with them immediately following your visit.   [facility] account gives you access to today's visit and all your visits, tests, and labs performed at [facility] " click here if you don't have a [facility] account or go to [email or web address]  Consent: (Patient) Pauline Good Sivertsen provided verbal consent for this virtual visit at the beginning of the encounter.  Current Medications:  Current Outpatient Medications:    rizatriptan (MAXALT) 10 MG tablet, Take 1 tablet (10 mg total) by mouth as needed for migraine. May repeat in 2 hours if needed, Disp: 10 tablet, Rfl: 0   fluconazole (DIFLUCAN) 150 MG tablet, Take 1 tablet PO once. Repeat in 3 days if needed., Disp: 2 tablet, Rfl: 0   metroNIDAZOLE (FLAGYL) 500 MG tablet, Take 1 tablet (500 mg total) by mouth 2 (two) times daily., Disp: 14 tablet, Rfl: 0   Medications ordered in this encounter:  Meds ordered this encounter  Medications   rizatriptan (MAXALT) 10 MG tablet    Sig: Take 1 tablet (10 mg total) by mouth as needed for migraine. May repeat in 2 hours if needed    Dispense:  10 tablet    Refill:  0    Order Specific Question:   Supervising Provider    Answer:   Chase Picket [[id number]]     *If you need refills on other medications prior to your next appointment, please contact your pharmacy*  Follow-Up: Call back or seek an in-person evaluation if the symptoms worsen or if the condition fails to improve as anticipated.  [place] [phone]  Other Instructions  Migraine Headache A migraine headache is an intense, throbbing pain on one side or both sides of the head. Migraine headaches may also cause other symptoms,  such as nausea, vomiting, and sensitivity to light and noise. A migraine headache can last from 4 hours to 3 days. Talk with your doctor about what things may bring on (trigger) your migraine headaches. What are the causes? The exact cause of this condition is not known. However, a migraine may be caused when nerves in the brain become irritated and release chemicals that cause inflammation of blood vessels. This inflammation causes pain. This condition may be triggered or caused by: Drinking alcohol. Smoking. Taking medicines, such as: Medicine used to treat chest pain (nitroglycerin). Birth control pills. Estrogen. Certain blood pressure medicines. Eating or drinking products that contain nitrates, glutamate, aspartame, or tyramine. Aged cheeses, chocolate, or caffeine may also be triggers. Doing physical activity. Other things that may trigger a migraine headache include: Menstruation. Pregnancy. Hunger. Stress. Lack of sleep or too much sleep. Weather changes. Fatigue. What increases the risk? The following factors may make you more likely to experience migraine headaches: Being a certain age. This condition is more common in people who are 11-90 years old. Being female. Having a family history of migraine headaches. Being Caucasian. Having a mental health condition, such as depression or anxiety. Being obese. What are the signs or symptoms? The main symptom of this condition is pulsating or throbbing pain. This pain may: Happen in any area of the head, such as on one side or both sides. Interfere with daily activities. Get worse with physical activity. Get worse  with exposure to bright lights or loud noises. Other symptoms may include: Nausea. Vomiting. Dizziness. General sensitivity to bright lights, loud noises, or smells. Before you get a migraine headache, you may get warning signs (an aura). An aura may include: Seeing flashing lights or having blind spots. Seeing  bright spots, halos, or zigzag lines. Having tunnel vision or blurred vision. Having numbness or a tingling feeling. Having trouble talking. Having muscle weakness. Some people have symptoms after a migraine headache (postdromal phase), such as: Feeling tired. Difficulty concentrating. How is this diagnosed? A migraine headache can be diagnosed based on: Your symptoms. A physical exam. Tests, such as: CT scan or an MRI of the head. These imaging tests can help rule out other causes of headaches. Taking fluid from the spine (lumbar puncture) and analyzing it (cerebrospinal fluid analysis, or CSF analysis). How is this treated? This condition may be treated with medicines that: Relieve pain. Relieve nausea. Prevent migraine headaches. Treatment for this condition may also include: Acupuncture. Lifestyle changes like avoiding foods that trigger migraine headaches. Biofeedback. Cognitive behavioral therapy. Follow these instructions at home: Medicines Take over-the-counter and prescription medicines only as told by your health care provider. Ask your health care provider if the medicine prescribed to you: Requires you to avoid driving or using heavy machinery. Can cause constipation. You may need to take these actions to prevent or treat constipation: Drink enough fluid to keep your urine pale yellow. Take over-the-counter or prescription medicines. Eat foods that are high in fiber, such as beans, whole grains, and fresh fruits and vegetables. Limit foods that are high in fat and processed sugars, such as fried or sweet foods. Lifestyle Do not drink alcohol. Do not use any products that contain nicotine or tobacco, such as cigarettes, e-cigarettes, and chewing tobacco. If you need help quitting, ask your health care provider. Get at least 8 hours of sleep every night. Find ways to manage stress, such as meditation, deep breathing, or yoga. General instructions Keep a journal to  find out what may trigger your migraine headaches. For example, write down: What you eat and drink. How much sleep you get. Any change to your diet or medicines. If you have a migraine headache: Avoid things that make your symptoms worse, such as bright lights. It may help to lie down in a dark, quiet room. Do not drive or use heavy machinery. Ask your health care provider what activities are safe for you while you are experiencing symptoms. Keep all follow-up visits as told by your health care provider. This is important. Contact a health care provider if: You develop symptoms that are different or more severe than your usual migraine headache symptoms. You have more than 15 headache days in one month. Get help right away if: Your migraine headache becomes severe. Your migraine headache lasts longer than 72 hours. You have a fever. You have a stiff neck. You have vision loss. Your muscles feel weak or like you cannot control them. You start to lose your balance often. You have trouble walking. You faint. You have a seizure. Summary A migraine headache is an intense, throbbing pain on one side or both sides of the head. Migraines may also cause other symptoms, such as nausea, vomiting, and sensitivity to light and noise. This condition may be treated with medicines and lifestyle changes. You may also need to avoid certain things that trigger a migraine headache. Keep a journal to find out what may trigger your migraine headaches. Contact your  health care provider if you have more than 15 headache days in a month or you develop symptoms that are different or more severe than your usual migraine headache symptoms. This information is not intended to replace advice given to you by your health care provider. Make sure you discuss any questions you have with your health care provider. Document Revised: 03/23/2022 Document Reviewed: 11/21/2018 Elsevier Patient Education  [place].  Rizatriptan Tablets What is this medication? RIZATRIPTAN (rye za TRIP tan) treats migraines. It works by blocking pain signals and narrowing blood vessels in the brain. It belongs to a group of medications called triptans. It is not used to prevent migraines. This medicine may be used for other purposes; ask your health care provider or pharmacist if you have questions. COMMON BRAND NAME(S): Maxalt What should I tell my care team before I take this medication? They need to know if you have any of these conditions: Circulation problems in fingers and toes Diabetes Heart disease High blood pressure High cholesterol History of irregular heartbeat History of stroke Stomach or intestine problems Tobacco use An unusual or allergic reaction to rizatriptan, other medications, foods, dyes, or preservatives Pregnant or trying to get pregnant Breast-feeding How should I use this medication? Take this medication by mouth with water. Take it as directed on the prescription label. Do not use it more often than directed. Talk to your care team about the use of this medication in children. While it may be prescribed for children as young as 6 years for selected conditions, precautions do apply. Overdosage: If you think you have taken too much of this medicine contact a poison control center or emergency room at once. NOTE: This medicine is only for you. Do not share this medicine with others. What if I miss a dose? This does not apply. This medication is not for regular use. What may interact with this medication? Do not take this medication with any of the following: Ergot alkaloids, such as dihydroergotamine, ergotamine MAOIs, such as Marplan, Nardil, Parnate Other medications for migraine headache, such as almotriptan, eletriptan, frovatriptan, naratriptan, sumatriptan, zolmitriptan This medication may also interact with the following: Certain medications for depression, anxiety, or other  mental health conditions Propranolol This list may not describe all possible interactions. Give your health care provider a list of all the medicines, herbs, non-prescription drugs, or dietary supplements you use. Also tell them if you smoke, drink alcohol, or use illegal drugs. Some items may interact with your medicine. What should I watch for while using this medication? Visit your care team for regular checks on your progress. Tell your care team if your symptoms do not start to get better or if they get worse. This medication may affect your coordination, reaction time, or judgment. Do not drive or operate machinery until you know how this medication affects you. Sit up or stand slowly to reduce the risk of dizzy or fainting spells. If you take migraine medications for 10 or more days a month, your migraines may get worse. Keep a diary of headache days and medication use. Contact your care team if your migraine attacks occur more frequently. What side effects may I notice from receiving this medication? Side effects that you should report to your care team as soon as possible: Allergic reactions--skin rash, itching, hives, swelling of the face, lips, tongue, or throat Burning, pain, tingling, or color changes in the hands, arms, legs, or feet Heart attack--pain or tightness in the chest, shoulders, arms,  or jaw, nausea, shortness of breath, cold or clammy skin, feeling faint or lightheaded Heart rhythm changes--fast or irregular heartbeat, dizziness, feeling faint or lightheaded, chest pain, trouble breathing Increase in blood pressure Irritability, confusion, fast or irregular heartbeat, muscle stiffness, twitching muscles, sweating, high fever, seizure, chills, vomiting, diarrhea, which may be signs of serotonin syndrome Raynaud syndrome--cool, numb, or painful fingers or toes that may change color from pale, to blue, to red Seizures Stroke--sudden numbness or weakness of the face, arm, or  leg, trouble speaking, confusion, trouble walking, loss of balance or coordination, dizziness, severe headache, change in vision Sudden or severe stomach pain, bloody diarrhea, fever, nausea, vomiting Vision loss Side effects that usually do not require medical attention (report to your care team if they continue or are bothersome): Dizziness Unusual weakness or fatigue This list may not describe all possible side effects. Call your doctor for medical advice about side effects. You may report side effects to FDA at 1-800-FDA-1088. Where should I keep my medication? Keep out of the reach of children and pets. Store at room temperature between 15 and 30 degrees C (59 and 86 degrees F). Get rid of any unused medication after the expiration date. To get rid of medications that are no longer needed or have expired: Take the medication to a medication take-back program. Check with your pharmacy or law enforcement to find a location. If you cannot return the medication, check the label or package insert to see if the medication should be thrown out in the garbage or flushed down the toilet. If you are not sure, ask your care team. If it is safe to put it in the trash, empty the medication out of the container. Mix the medication with cat litter, dirt, coffee grounds, or other unwanted substance. Seal the mixture in a bag or container. Put it in the trash. NOTE: This sheet is a summary. It may not cover all possible information. If you have questions about this medicine, talk to your doctor, pharmacist, or health care provider.  2023 Elsevier/Gold Standard (2020-11-17 00:00:00)    If you have been instructed to have an in-person evaluation today at a local Urgent Care facility, please use the link below. It will take you to a list of all of our available [facility], including address, phone number and hours of operation. Please do not delay care.  [facility]  If you or a  family member do not have a primary care provider, use the link below to schedule a visit and establish care. When you choose a [facility] primary care physician or advanced practice provider, you gain a long-term partner in health. Find a Primary Care Provider  Learn more about [facility]'s in-office and virtual care options: [facility] - Get Care Now

## 2022-11-28 ENCOUNTER — Telehealth: Payer: Medicaid Other | Admitting: Physician Assistant

## 2022-11-28 DIAGNOSIS — U071 COVID-19: Secondary | ICD-10-CM

## 2022-11-28 MED ORDER — COVID-19 AT-HOME TEST VI KIT: PACK | 0 refills | Status: DC

## 2022-11-28 MED ORDER — BENZONATATE 100 MG PO CAPS: 100.0000 mg | ORAL_CAPSULE | Freq: Three times a day (TID) | ORAL | 0 refills | Status: DC | PRN

## 2022-11-28 NOTE — Progress Notes (Signed)
I have spent 5 minutes in review of e-visit questionnaire, review and updating patient chart, medical decision making and response to patient.   Raynell Upton Cody Sade Hollon, PA-C    

## 2022-11-28 NOTE — Progress Notes (Signed)
Message sent to patient requesting further input regarding current symptoms. Awaiting patient response.  

## 2022-11-28 NOTE — Progress Notes (Signed)
E-Visit for [employer] Virus / COVID Screening  Your current symptoms could be consistent with COVID.  Please use the home test I have sent in as a prescription to test for COVID, messaging [place] with results.    Additional testing sites in the Community:  For [facility] Testing sites in [place]  [email or web address]  For Pop-up testing sites in [place]  [email or web address]  For [facility] [email or web address]  For [facility] testing in [place] and [employer] [email or web address]  For [facility] testing in [place]   [email or web address]  For  more information about community testing call [phone]   Please quarantine yourself while awaiting your test results. Please stay home for a minimum of 10 days from the first day of illness with improving symptoms and you have had 24 hours of no fever (without the use of Tylenol (Acetaminophen) Motrin (Ibuprofen) or any fever reducing medication).  Also - Do not get tested prior to returning to work because once you have had a positive test the test can stay positive for more than a month in some cases.   You should wear a mask or cloth face covering over your nose and mouth if you must be around other people or animals, including pets (even at home). Try to stay at least 6 feet away from other people. This will protect the people around you.  Please continue good preventive care measures, including:  frequent hand-washing, avoid touching your face, cover coughs/sneezes, stay out of crowds and keep a 6 foot distance from others.  COVID-19 is a respiratory illness with symptoms that are similar to the flu. Symptoms are  typically mild to moderate, but there have been cases of severe illness and death due to the virus.   The following symptoms may appear 2-14 days after exposure: Fever Cough Shortness of breath or difficulty breathing Chills Repeated shaking with chills Muscle pain Headache Sore throat New loss of taste or smell Fatigue Congestion or runny nose Nausea or vomiting Diarrhea  Go to the nearest hospital ED for assessment if fever/cough/breathlessness are severe or illness seems like a threat to life.  It is vitally important that if you feel that you have an infection such as this virus or any other virus that you stay home and away from places where you may spread it to others.  You should avoid contact with people age 83 and older.   You can use medication such as prescription cough medication called Tessalon Perles 100 mg. You may take 1-2 capsules every 8 hours as needed for cough  You may also take acetaminophen (Tylenol) as needed for fever.  Reduce your risk of any infection by using the same precautions used for avoiding the common cold or flu:  Wash your hands often with soap and warm water for at least 20 seconds.  If soap and water are not readily available, use an alcohol-based hand sanitizer with at least 60% alcohol.  If coughing or sneezing, cover your mouth and nose by coughing or sneezing into the elbow areas of your shirt or coat, into a tissue or into your sleeve (not your hands). Avoid shaking hands with others and consider head nods or verbal greetings only. Avoid touching your eyes, nose, or mouth with unwashed hands.  Avoid close contact with people who are sick. Avoid places or events with large numbers of people in one location, like concerts or sporting events. Carefully consider travel plans you have or are making. If you are  planning any travel outside or inside the [place], visit the CDC's Travelers' Health webpage for the latest health notices. If you have some  symptoms but not all symptoms, continue to monitor at home and seek medical attention if your symptoms worsen. If you are having a medical emergency, call 911.  HOME CARE Only take medications as instructed by your medical team. Drink plenty of fluids and get plenty of rest. A steam or ultrasonic humidifier can help if you have congestion.   GET HELP RIGHT AWAY IF YOU HAVE EMERGENCY WARNING SIGNS** FOR COVID-19. If you or someone is showing any of these signs seek emergency medical care immediately. Call 911 or proceed to your closest emergency facility if: You develop worsening high fever. Trouble breathing Bluish lips or face Persistent pain or pressure in the chest New confusion Inability to wake or stay awake You cough up blood. Your symptoms become more severe  **This list is not all possible symptoms. Contact your medical provider for any symptoms that are sever or concerning to you.  MAKE SURE YOU  Understand these instructions. Will watch your condition. Will get help right away if you are not doing well or get worse.  Your e-visit answers were reviewed by a board certified advanced clinical practitioner to complete your personal care plan.  Depending on the condition, your plan could have included both over the counter or prescription medications.  If there is a problem please reply once you have received a response from your provider.  Your safety is important to [place].  If you have drug allergies check your prescription carefully.    You can use MyChart to ask questions about today's visit, request a non-urgent call back, or ask for a work or school excuse for 24 hours related to this e-Visit. If it has been greater than 24 hours you will need to follow up with your provider, or enter a new e-Visit to address those concerns. You will get an e-mail in the next two days asking about your experience.  I hope that your e-visit has been valuable and will speed your recovery. Thank  you for using e-visits.

## 2022-12-03 ENCOUNTER — Telehealth: Payer: Medicaid Other | Admitting: Nurse Practitioner

## 2022-12-03 DIAGNOSIS — K219 Gastro-esophageal reflux disease without esophagitis: Secondary | ICD-10-CM | POA: Diagnosis not present

## 2022-12-03 MED ORDER — FAMOTIDINE 20 MG PO TABS: 20.0000 mg | ORAL_TABLET | Freq: Two times a day (BID) | ORAL | 0 refills | Status: DC

## 2022-12-03 NOTE — Progress Notes (Signed)
E-Visit for Heartburn  We are sorry that you are not feeling well.  Here is how we plan to help!  Based on what you shared with me it looks like you most likely have Gastroesophageal Reflux Disease (GERD)  Gastroesophageal reflux disease (GERD) happens when acid from your stomach flows up into the esophagus.  When acid comes in contact with the esophagus, the acid causes sorenss (inflammation) in the esophagus.  Over time, GERD may create small holes (ulcers) in the lining of the esophagus.  I recommend using over the counter Pepcid 65m one by mouth twice a day for two weeks. I have sent a prescription to pharmacy which may or may not be covered by your insurance. You will need to follow up with your PCP for additional refills.   Your symptoms should improve in the next day or two.  You can use antacids as needed until symptoms resolve.  Call [place] your heartburn worsens, you have trouble swallowing, weight loss, spitting up blood or recurrent vomiting.  Home Care: May include lifestyle changes such as weight loss, quitting smoking and alcohol consumption Avoid foods and drinks that make your symptoms worse, such as: Caffeine or alcoholic drinks Chocolate Peppermint or mint flavorings Garlic and onions Spicy foods Citrus fruits, such as oranges, lemons, or limes Tomato-based foods such as sauce, chili, salsa and pizza Fried and fatty foods Avoid lying down for 3 hours prior to your bedtime or prior to taking a nap Eat small, frequent meals instead of a large meals Wear loose-fitting clothing.  Do not wear anything tight around your waist that causes pressure on your stomach. Raise the head of your bed 6 to 8 inches with wood blocks to help you sleep.  Extra pillows will not help.  Seek Help Right Away If: You have pain in your arms, neck, jaw, teeth or back Your pain increases or changes in intensity or duration You develop nausea, vomiting or sweating (diaphoresis) You develop  shortness of breath or you faint Your vomit is green, yellow, black or looks like coffee grounds or blood Your stool is red, bloody or black  These symptoms could be signs of other problems, such as heart disease, gastric bleeding or esophageal bleeding.  Make sure you : Understand these instructions. Will watch your condition. Will get help right away if you are not doing well or get worse.  Your e-visit answers were reviewed by a board certified advanced clinical practitioner to complete your personal care plan.  Depending on the condition, your plan could have included both over the counter or prescription medications.  If there is a problem please reply  once you have received a response from your provider.  Your safety is important to [place]  If you have drug allergies check your prescription carefully.    You can use MyChart to ask questions about today's visit, request a non-urgent call back, or ask for a work or school excuse for 24 hours related to this e-Visit. If it has been greater than 24 hours you will need to follow up with your provider, or enter a new e-Visit to address those concerns.  You will get an e-mail in the next two days asking about your experience.  I hope that your e-visit has been valuable and will speed your recovery. Thank you for using e-visits.

## 2022-12-03 NOTE — Progress Notes (Signed)
I have spent 5 minutes in review of e-visit questionnaire, review and updating patient chart, medical decision making and response to patient.  ° °[name] W Jayelyn Barno, NP ° °  °

## 2022-12-11 ENCOUNTER — Telehealth: Payer: Medicaid Other

## 2022-12-18 ENCOUNTER — Encounter: Payer: Self-pay | Admitting: Physician Assistant

## 2022-12-18 ENCOUNTER — Ambulatory Visit: Payer: Medicaid Other | Admitting: Physician Assistant

## 2022-12-18 ENCOUNTER — Ambulatory Visit: Payer: Medicaid Other | Attending: Family Medicine

## 2022-12-18 ENCOUNTER — Ambulatory Visit: Payer: Self-pay | Admitting: *Deleted

## 2022-12-18 VITALS — BP 145/83 | HR 97 | Ht 63.0 in | Wt 265.0 lb

## 2022-12-18 DIAGNOSIS — R000 Tachycardia, unspecified: Secondary | ICD-10-CM | POA: Diagnosis not present

## 2022-12-18 DIAGNOSIS — Z6841 Body Mass Index (BMI) 40.0 and over, adult: Secondary | ICD-10-CM

## 2022-12-18 DIAGNOSIS — R030 Elevated blood-pressure reading, without diagnosis of hypertension: Secondary | ICD-10-CM

## 2022-12-18 DIAGNOSIS — F411 Generalized anxiety disorder: Secondary | ICD-10-CM | POA: Diagnosis not present

## 2022-12-18 DIAGNOSIS — N361 Urethral diverticulum: Secondary | ICD-10-CM

## 2022-12-18 MED ORDER — PROPRANOLOL HCL 10 MG PO TABS: 10.0000 mg | ORAL_TABLET | Freq: Two times a day (BID) | ORAL | 1 refills | Status: DC

## 2022-12-18 NOTE — Patient Instructions (Signed)
You are going to start taking propranolol 10 mg twice a day to help with your blood pressure and elevated heart rate.  I encourage you to check your blood pressure and pulse rate at home, keep a written log and have available for all office visits.  You are going to follow-up with the mobile unit in 2 weeks, we will call you at the beginning of next week to let me know where we will be located.  We will call you with your lab results as soon as they are available.  Kennieth Rad, PA-C Physician Assistant [facility] [email or web address]   Sinus Tachycardia  Sinus tachycardia is a fast heartbeat. In sinus tachycardia, the heart beats more than 100 times a minute. Sinus tachycardia starts in the part of the heart called the sinoatrial (SA) node. Sinus tachycardia may be harmless, or it may be a sign of a serious condition. What are the causes? This condition may be caused by: Exercise or exertion. A fever. Pain. Loss of body fluids (dehydration). Severe bleeding (hemorrhage). Anxiety and stress. Certain substances, including: Alcohol. Caffeine. Tobacco and nicotine products. Cold medicines. Illegal drugs. Medical conditions including: Heart disease. An infection. An overactive thyroid (hyperthyroidism). A lack of red blood cells (anemia). What are the signs or symptoms? Symptoms of this condition include: A feeling that the heart is beating fast or unevenly (palpitations). Suddenly noticing your heartbeat (cardiac awareness). Lightheadedness. Tiredness (fatigue). Shortness of breath. Chest pain. Nausea. Fainting. How is this diagnosed? This condition is diagnosed with: A physical exam. Tests or monitoring, such as: Blood tests. An electrocardiogram (ECG). This test measures the electrical activity of the heart. Ambulatory cardiac monitor. This records your heartbeats for 24 hours or more. You may be referred to a  heart specialist (cardiologist). How is this treated? Treatment for this condition depends on the cause. Treatment may involve: Treating the underlying condition. Taking new medicines or changing your current medicines as told by your health care provider. Making changes to your diet or lifestyle. Follow these instructions at home: Lifestyle  Do not use any products that contain nicotine or tobacco. These products include cigarettes, chewing tobacco, and vaping devices, such as e-cigarettes. If you need help quitting, ask your health care provider. Do not use illegal drugs, such as cocaine. Learn relaxation methods to help you when you get stressed or anxious. These include deep breathing. Avoid caffeine or other stimulants, including herbal stimulants that are found in energy drinks. Alcohol use  Do not drink alcohol if: Your health care provider tells you not to drink. You are pregnant, may be pregnant, or are planning to become pregnant. If you drink alcohol: Limit how much you have to: 0-1 drink a day for women. 0-2 drinks a day for men. Know how much alcohol is in your drink. In the [place] one drink equals one 12 oz bottle of beer (355 mL), one 5 oz glass of wine (148 mL), or one 1 oz glass of hard liquor (44 mL). General instructions Drink enough fluids to keep your urine pale yellow. Take over-the-counter and prescription medicines only as told by your health care provider. Ask your health care provider about taking vitamins, herbs, and supplements. Contact a health care provider if: You have vomiting or diarrhea that does not go away. You have a fever. You have weakness or dizziness. You feel faint. Get help right away if: You have pain in your chest, upper arms, jaw, or neck. You have palpitations that do  not go away. Summary In sinus tachycardia, the heart beats more than 100 times a minute. Sinus tachycardia may be harmless, or it may be a sign of a serious  condition. Treatment for this condition depends on the cause or the underlying condition. Get help right away if you have pain in your chest, upper arms, jaw, or neck. This information is not intended to replace advice given to you by your health care provider. Make sure you discuss any questions you have with your health care provider. Document Revised: 02/07/2022 Document Reviewed: 02/07/2022 Elsevier Patient Education  [place].

## 2022-12-18 NOTE — Telephone Encounter (Signed)
  Chief Complaint: high BP- with symptoms Symptoms: 149/90,131/89- elevated pulse 110,112 Frequency: ongoing- today Pertinent Negatives: Patient denies diagnosis of high BP- other than during pregancy Disposition: []$ ED /[]$ Urgent Care (no appt availability in office) / []$ Appointment(In office/virtual)/ []$  [facility]/ []$ Home Care/ []$ Refused Recommended Disposition /[x]$ [facility]/ []$  Follow-up with PCP Additional Notes: Patient wants to to be seen today- she has been having elevated BP and pulse and it is concerning to her. Patient does have appointment 3/14- advised mobile unit for evaluation today- she will go.

## 2022-12-18 NOTE — Progress Notes (Signed)
Established Patient Office Visit  Subjective   Patient ID: [name], female    DOB: [birth year]  Age: 27 y.o. MRN: [id number]  Chief Complaint  Patient presents with   Shortness of Breath    Iight headedness, heart rate increase     States that she has started checking her blood pressure and pulse at home because she has not been able to donate plasma due to having elevated blood pressure readings and elevated pulse readings.  States that her readings have been elevated similar to today's, and her pulse readings have been elevated similar to her initial one when arriving at the mobile unit.  States that she has been feeling shaky, extra thirsty, states that she will have shortness of breath after walking around for a couple of minutes.  States that this has all been ongoing for the past 6 months but worse in the past 2 months.  States that she has been having difficulty sleeping over the past 2 months due to her worries of her elevated blood pressure readings.  Does endorse history of anxiety, states that she has failed Zoloft, Prozac and over-the-counter stress medications.         12/18/2022   11:16 AM  GAD 7 : Generalized Anxiety Score  Nervous, Anxious, on Edge 1  Control/stop worrying 0  Worry too much - different things 0  Trouble relaxing 3  Restless 0  Easily annoyed or irritable 3  Afraid - awful might happen 0  Total GAD 7 Score 7  Anxiety Difficulty Extremely difficult       12/18/2022   11:16 AM 12/29/2021    9:49 AM  Depression screen PHQ 2/9  Decreased Interest 3 3  Down, Depressed, Hopeless 2 2  PHQ - 2 Score 5 5  Altered sleeping 3 3  Tired, decreased energy 3 3  Change in appetite 3 3  Feeling bad or failure about yourself  0 0  Trouble concentrating 2 3  Moving slowly or fidgety/restless 1 0  Suicidal thoughts 0 0  PHQ-9 Score 17 17  Difficult doing work/chores Extremely dIfficult Very difficult    Past Medical History:  Diagnosis  Date   Anxiety    Chlamydia 2018, 2021   Chronic kidney disease    kidney stones and kidney infection with last pregnancy   Depression    "gets in her moods, currently ok'   Gonorrhea 2018   Headache    Incisional hernia 04/2013   Infection    UTI   LGSIL on Pap smear of cervix 2019   Ovarian cyst    Urethral diverticulum    Social History   Socioeconomic History   Marital status: Married    Spouse name: Not on file   Number of children: Not on file   Years of education: Not on file   Highest education level: Not on file  Occupational History   Not on file  Tobacco Use   Smoking status: Never    Passive exposure: Never   Smokeless tobacco: Never  Vaping Use   Vaping Use: Never used  Substance and Sexual Activity   Alcohol use: Not Currently    Comment: last drink 2 yrs ago   Drug use: No   Sexual activity: Yes    Partners: Male, Female    Birth control/protection: None    Comment: female partner  Other Topics Concern   Not on file  Social History Narrative   Not on file  Social Determinants of Health   Financial Resource Strain: Not on file  Food Insecurity: Not on file  Transportation Needs: Not on file  Physical Activity: Not on file  Stress: Not on file  Social Connections: Not on file  Intimate Partner Violence: Not on file   Family History  Problem Relation Age of Onset   Hypertension Mother    Hypertension Father    Diabetes Paternal Grandmother    Lung cancer Paternal Grandfather        smoked   Cancer Paternal Grandfather    Seizures Paternal Grandfather    Breast cancer Maternal Aunt    Allergies  Allergen Reactions   Penicillins Anaphylaxis, Itching, Swelling, Rash and Other (See Comments)    Has patient had a PCN reaction causing immediate rash, facial/tongue/throat swelling, SOB or lightheadedness with hypotension: Yes Has patient had a PCN reaction causing severe rash involving mucus membranes or skin necrosis: No Has patient had a  PCN reaction that required hospitalization: No Has patient had a PCN reaction occurring within the last 10 years: No If all of the above answers are "NO", then may proceed with Cephalosporin use.  And swelling    Review of Systems  Constitutional: Negative.   HENT: Negative.    Eyes:  Negative for blurred vision and photophobia.  Respiratory:  Positive for shortness of breath.   Cardiovascular:  Negative for chest pain.  Gastrointestinal:  Negative for nausea.  Genitourinary:  Negative for frequency.  Musculoskeletal: Negative.   Skin: Negative.   Neurological:  Negative for headaches.  Endo/Heme/Allergies: Negative.   Psychiatric/Behavioral:  The patient is nervous/anxious and has insomnia.       Objective:     BP (!) 145/83 (BP Location: Left Arm, Patient Position: Sitting, Cuff Size: Large)   Pulse 97   Ht '5\' 3"'$  (1.6 m)   Wt 265 lb (120.2 kg)   LMP 12/14/2022   SpO2 95%   BMI 46.94 kg/m  BP Readings from Last 3 Encounters:  12/18/22 (!) 145/83  06/05/22 129/88  04/11/22 118/78   Wt Readings from Last 3 Encounters:  12/18/22 265 lb (120.2 kg)  04/11/22 271 lb 2.7 oz (123 kg)  01/12/22 264 lb (119.7 kg)      Physical Exam Vitals and nursing note reviewed.  Constitutional:      Appearance: Normal appearance.  HENT:     Head: Normocephalic and atraumatic.     Right Ear: External ear normal.     Left Ear: External ear normal.     Nose: Nose normal.     Mouth/Throat:     Mouth: Mucous membranes are moist.     Pharynx: Oropharynx is clear.  Eyes:     Extraocular Movements: Extraocular movements intact.     Conjunctiva/sclera: Conjunctivae normal.     Pupils: Pupils are equal, round, and reactive to light.  Cardiovascular:     Rate and Rhythm: Regular rhythm. Tachycardia present.     Pulses: Normal pulses.     Heart sounds: Normal heart sounds.  Pulmonary:     Effort: Pulmonary effort is normal.     Breath sounds: Normal breath sounds.  Musculoskeletal:         General: Normal range of motion.     Cervical back: Normal range of motion.  Skin:    General: Skin is warm and dry.  Neurological:     General: No focal deficit present.     Mental Status: She is alert and oriented to person,  place, and time.  Psychiatric:        Attention and Perception: Attention normal.        Mood and Affect: Mood is anxious. Affect is tearful.        Speech: Speech normal.        Behavior: Behavior normal. Behavior is cooperative.        Thought Content: Thought content normal.        Cognition and Memory: Cognition and memory normal.        Judgment: Judgment normal.       Assessment & Plan:   Problem List Items Addressed This Visit   None Visit Diagnoses     Elevated blood pressure reading in office without diagnosis of hypertension    -  Primary   Relevant Orders   CBC with Differential/Platelet   Comp. Metabolic Panel (12)   Tachycardia       Relevant Medications   propranolol (INDERAL) 10 MG tablet   Other Relevant Orders   TSH   Class 3 severe obesity due to excess calories with body mass index (BMI) of 45.0 to 49.9 in adult, unspecified whether serious comorbidity present ([place])          1. Elevated blood pressure reading in office without diagnosis of hypertension Patient encouraged to check blood pressure at home, keep a written log and have available for all office visits.  Patient to follow-up with mobile unit in 2 weeks. - CBC with Differential/Platelet - Comp. Metabolic Panel (12)  2. Tachycardia Trial propranolol.  Red flags given for prompt reevaluation - TSH - propranolol (INDERAL) 10 MG tablet; Take 1 tablet (10 mg total) by mouth 2 (two) times daily.  Dispense: 60 tablet; Refill: 1  3. Class 3 severe obesity due to excess calories with body mass index (BMI) of 45.0 to 49.9 in adult, unspecified whether serious comorbidity present ([place])    I have reviewed the patient's medical history (PMH, PSH, Social History, Family  History, Medications, and allergies) , and have been updated if relevant. I spent 30 minutes reviewing chart and  face to face time with patient.    Return in about 2 weeks (around 01/01/2023) for with MMU.    Loraine Grip Mayers, PA-C

## 2022-12-18 NOTE — Telephone Encounter (Signed)
Reason for Disposition  [id number] Systolic BP  >= [id number] OR Diastolic >= 80 AND [id number] not taking BP medications  Answer Assessment - Initial Assessment Questions 1. BLOOD PRESSURE: "What is the blood pressure?" "Did you take at least two measurements 5 minutes apart?"     149/90 P 110, 131/89 P 112 2. ONSET: "When did you take your blood pressure?"     This morning 3. HOW: "How did you take your blood pressure?" (e.g., automatic home BP monitor, visiting nurse)     Uses at [facility] 4. HISTORY: "Do you have a history of high blood pressure?"     No- only during pregnancy  5. MEDICINES: "Are you taking any medicines for blood pressure?" "Have you missed any doses recently?"     no 6. OTHER SYMPTOMS: "Do you have any symptoms?" (e.g., blurred vision, chest pain, difficulty breathing, headache, weakness)     Lightheaded, dizzy  Protocols used: Blood Pressure - High-A-AH

## 2022-12-19 ENCOUNTER — Telehealth: Payer: Self-pay

## 2022-12-19 LAB — COMP. METABOLIC PANEL (12)
AST: 26 IU/L (ref 0–40)
Albumin/Globulin Ratio: 1.7 (ref 1.2–2.2)
Albumin: 4.6 g/dL (ref 4.0–5.0)
Alkaline Phosphatase: 113 IU/L (ref 44–121)
BUN/Creatinine Ratio: 12 (ref 9–23)
BUN: 10 mg/dL (ref 6–20)
Bilirubin Total: <0.2 mg/dL (ref 0.0–1.2)
Calcium: 9.7 mg/dL (ref 8.7–10.2)
Chloride: 103 mmol/L (ref 96–106)
Creatinine, Ser: 0.83 mg/dL (ref 0.57–1.00)
Globulin, Total: 2.7 g/dL (ref 1.5–4.5)
Glucose: 85 mg/dL (ref 70–99)
Potassium: 4.1 mmol/L (ref 3.5–5.2)
Sodium: 142 mmol/L (ref 134–144)
Total Protein: 7.3 g/dL (ref 6.0–8.5)
eGFR: 99 mL/min/{1.73_m2} (ref >59)

## 2022-12-19 LAB — CBC WITH DIFFERENTIAL/PLATELET
Basophils Absolute: 0 10*3/uL (ref 0.0–0.2)
Basos: 0 %
EOS (ABSOLUTE): 0.1 10*3/uL (ref 0.0–0.4)
Eos: 1 %
Hematocrit: 41.4 % (ref 34.0–46.6)
Hemoglobin: 14.5 g/dL (ref 11.1–15.9)
Immature Grans (Abs): 0 10*3/uL (ref 0.0–0.1)
Immature Granulocytes: 0 %
Lymphocytes Absolute: 3 10*3/uL (ref 0.7–3.1)
Lymphs: 31 %
MCH: 32.3 pg (ref 26.6–33.0)
MCHC: 35 g/dL (ref 31.5–35.7)
MCV: 92 fL (ref 79–97)
Monocytes Absolute: 0.4 10*3/uL (ref 0.1–0.9)
Monocytes: 5 %
Neutrophils Absolute: 5.9 10*3/uL (ref 1.4–7.0)
Neutrophils: 63 %
Platelets: 297 10*3/uL (ref 150–450)
RBC: 4.49 x10E6/uL (ref 3.77–5.28)
RDW: 13.1 % (ref 11.7–15.4)
WBC: 9.5 10*3/uL (ref 3.4–10.8)

## 2022-12-19 LAB — TSH: TSH: 2.11 u[IU]/mL (ref 0.450–4.500)

## 2022-12-21 NOTE — Telephone Encounter (Signed)
Patient called requesting a prescription for a blood pressure cuff. Hse was informed per Johnette Abraham, PA that insurance doesn't cover bp cuff. She would have to purchase over the counter. Patient states she will try and get one, she has limited income at this time, and has been going to [facility] daily for her daily readings.

## 2023-01-03 ENCOUNTER — Encounter: Payer: Self-pay | Admitting: Obstetrics and Gynecology

## 2023-01-03 ENCOUNTER — Ambulatory Visit (INDEPENDENT_AMBULATORY_CARE_PROVIDER_SITE_OTHER): Payer: Medicaid Other | Admitting: Obstetrics and Gynecology

## 2023-01-03 VITALS — BP 127/84 | HR 105 | Ht 63.0 in | Wt 262.2 lb

## 2023-01-03 DIAGNOSIS — R87619 Unspecified abnormal cytological findings in specimens from cervix uteri: Secondary | ICD-10-CM | POA: Insufficient documentation

## 2023-01-03 DIAGNOSIS — N939 Abnormal uterine and vaginal bleeding, unspecified: Secondary | ICD-10-CM | POA: Diagnosis not present

## 2023-01-03 DIAGNOSIS — R87610 Atypical squamous cells of undetermined significance on cytologic smear of cervix (ASC-US): Secondary | ICD-10-CM | POA: Diagnosis not present

## 2023-01-03 MED ORDER — NORETHINDRONE ACETATE 5 MG PO TABS: 5.0000 mg | ORAL_TABLET | Freq: Every day | ORAL | 3 refills | Status: DC

## 2023-01-03 NOTE — Progress Notes (Signed)
Pt presents for hysterectomy consult. Pt report irregular periods since last year. Periods are heavy with bad PMS symptoms. Pt requesting elective hysterectomy.

## 2023-01-03 NOTE — Progress Notes (Signed)
RETURN GYNECOLOGY VISIT  Subjective:  [name] is a 27 y.o. [id number] with LMP 12/14/22 presenting for hysterectomy consult  Longstanding irregular, painful & heavy periods that have been worsening since pregnancy termination in early 2023. Reports that prior to her termination, her periods were bothersome but manageable. Now cycles are q28-60 days, last 6-8d. Saturates a pad q30-52mn and passes clots. +intermenstrual spotting. For the week prior & week of her period she feels "horrible" with cramping, nausea, migraines, and lightheadedness. Symptoms are so severe she misses work.   ROS: positive for acne, hot flashes, difficulty losing weight, brain dog, irritability  I personally reviewed the following components of her AUB work up: CBC 12/18/22 Hgb 14.5 TSH 12/18/22 2.11 Pap 06/13/21 ASCUS/HPV+ Cervicovaginal ancillary negative candida, GC/CT/trich; +BV Pelvic UKorea9/22/22 7.4 x 4.7 x 7.1 cm retroflexed uterus with heterogenous myometrium, EL 969m normal bilateral ovaries  She has tried TXA without improvement. Had IUD in past that helped her periods but made her gain weight. Takes tylenol & pamprin prn for pain around her period.   Sexually active with female partner. She and her wife want another child, but they would want her wife to carry the pregnancy. Her wife has PCOS so they have concerns about her ability to conceive/carry a pregnancy.   Past Medical History:  Diagnosis Date   Anxiety    Chlamydia 2018, 2021   Chronic kidney disease    kidney stones and kidney infection with last pregnancy   Depression    "gets in her moods, currently ok'   Gonorrhea 2018   Headache    Incisional hernia 04/2013   Infection    UTI   LGSIL on Pap smear of cervix 2019   Ovarian cyst    Urethral diverticulum    Past Surgical History:  Procedure Laterality Date   CHOLECYSTECTOMY  04/03/2012   Procedure: LAPAROSCOPIC CHOLECYSTECTOMY WITH INTRAOPERATIVE CHOLANGIOGRAM;  Surgeon:  ToEarnstine RegalMD;  Location: [facility];  Service: General;  Laterality: N/A;   ERCP  03/12/2012   Procedure: ENDOSCOPIC RETROGRADE CHOLANGIOPANCREATOGRAPHY (ERCP);  Surgeon: MaJeryl ColumbiaMD;  [place] [facility];  Laterality: N/A;   INGUINAL HERNIA REPAIR N/A 05/15/2013   Procedure: HERNIA REPAIR INCISIONAL;  Surgeon: ToEarnstine RegalMD;  [facility] Service: General;  Laterality: N/A;   INSERTION OF MESH N/A 05/15/2013   Procedure: INSERTION OF MESH;  Surgeon: ToEarnstine RegalMD;  [facility] Service: General;  Laterality: N/A;   URETERAL STENT PLACEMENT     WISDOM TOOTH EXTRACTION     Current Outpatient Medications on File Prior to Visit  Medication Sig Dispense Refill   propranolol (INDERAL) 10 MG tablet Take 1 tablet (10 mg total) by mouth 2 (two) times daily. 60 tablet 1   famotidine (PEPCID) 20 MG tablet Take 1 tablet (20 mg total) by mouth 2 (two) times daily. (Patient not taking: Reported on 12/18/2022) 28 tablet 0   rizatriptan (MAXALT) 10 MG tablet Take 1 tablet (10 mg total) by mouth as needed for migraine. May repeat in 2 hours if needed (Patient not taking: Reported on 12/18/2022) 10 tablet 0   No current facility-administered medications on file prior to visit.   Allergies  Allergen Reactions   Penicillins Anaphylaxis, Itching, Swelling, Rash and Other (See Comments)    Has patient had a PCN reaction causing immediate rash, facial/tongue/throat swelling, SOB or lightheadedness with hypotension: Yes Has patient had  a PCN reaction causing severe rash involving mucus membranes or skin necrosis: No Has patient had a PCN reaction that required hospitalization: No Has patient had a PCN reaction occurring within the last 10 years: No If all of the above answers are "NO", then may proceed with Cephalosporin use.  And swelling   OB History     Gravida  9   Para  5   Term  2   Preterm  3   AB  4   Living  5       SAB  3   IAB  0   Ectopic  0   Multiple  0   Live Births  5          Social History   Socioeconomic History   Marital status: Married    Spouse name: Not on file   Number of children: Not on file   Years of education: Not on file   Highest education level: Not on file  Occupational History   Not on file  Tobacco Use   Smoking status: Never    Passive exposure: Never   Smokeless tobacco: Never  Vaping Use   Vaping Use: Never used  Substance and Sexual Activity   Alcohol use: Not Currently    Comment: last drink 2 yrs ago   Drug use: No   Sexual activity: Not Currently    Partners: Male, Female    Birth control/protection: None    Comment: female partner  Other Topics Concern   Not on file  Social History Narrative   Not on file   Social Determinants of Health   Financial Resource Strain: Not on file  Food Insecurity: Not on file  Transportation Needs: Not on file  Physical Activity: Not on file  Stress: Not on file  Social Connections: Not on file  Intimate Partner Violence: Not on file   Objective:   Vitals:   01/03/23 1553  BP: 127/84  Pulse: (!) 105  Weight: 262 lb 3.2 oz (118.9 kg)  Height: '5\' 3"'$  (1.6 m)    General:  Alert, oriented and cooperative. Patient is in no acute distress.  Skin: Skin is warm and dry. No rash noted.   Cardiovascular: Normal heart rate noted  Respiratory: Normal respiratory effort, no problems with respiration noted  Abdomen: Soft, non-tender, non-distended    Assessment and Plan:  [name] is a 27 y.o. with AUB  AUB, chronic & worsening - We reviewed a focused differential diagnosis for AUB that includes, but is not limited to anovulatory cycles (menopausal transition/POI, PCOS, hypothalamic hypogonadism), endometrial/endocervical polyps, adenomyosis, fibroids, endometrial hyperplasia/endometrial cancer, infection (e.g., endometritis), medication side effect (blood thinners, contraceptives, some  antipsychotics), and thyroid disease. Given her history, exam & work up thus far, the most likely etiology is anovulatory cycles or adenomyosis. - She has already had normal CBC, TSH, pelvic US. Due for colposcopy (see below).  - We reviewed management options including progestins (continuous & cyclic), DMPA (Depo Provera), LNG-IUD (Mirena), Lysteda (TXA), GnRH antagonists with add back therapy (Myfembree/Oriahnn), endometrial ablation, and hysterectomy - Reviewed that hysterectomy is definitive treatment for bleeding, but is the most invasive with the highest risk. Recommended attempt at medical management before pursing hysterectomy further.  - After discussion, she opted for aygestin '5mg'$  daily - RTC in 3 months for reassessment of symptoms  Abnormal pap ASCUS/HPV+ pap in 2022 Did not see result until performing chart review after appointment. MyChart message sent to coordinate colposcopy  Return in about 3 months (around 04/05/2023) for return gyn to follow up symptoms.  Future Appointments  Date Time Provider [place]  01/04/2023 10:00 AM Camillia Herter, NP [facility] None   Inez Catalina, MD

## 2023-01-03 NOTE — Progress Notes (Signed)
Patient ID: [name], female    DOB: 01-22-1996  MRN: [id number]  CC: Annual Physical Exam  Subjective: [name] is a 27 y.o. female who presents for annual physical exam.   Her concerns today include:  - On 12/18/2022 patient had an appointment with Maurene Capes, PA at the [facility]. She was prescribed Propranolol at that time. Since then patient reports feeling dizzy and "off". Home blood pressures 130's-140's/80's. She does not have her home monitor today in office for comparison. She denies red flag symptoms such as but not limited to chest pain, shortness of breath, worst headache of life, nausea/vomiting.  - Would like referral to Medical Weight Management to assist with weight loss. She is monitoring what she eats.   Patient Active Problem List   Diagnosis Date Noted   Abnormal Pap smear of cervix 01/03/2023   Low back pain 02/24/2022   Request for sterilization 08/28/2019   Status post hernia repair 03/26/2019     Current Outpatient Medications on File Prior to Visit  Medication Sig Dispense Refill   norethindrone (AYGESTIN) 5 MG tablet Take 1 tablet (5 mg total) by mouth daily. 90 tablet 3   propranolol (INDERAL) 10 MG tablet Take 1 tablet (10 mg total) by mouth 2 (two) times daily. 60 tablet 1   No current facility-administered medications on file prior to visit.    Allergies  Allergen Reactions   Penicillins Anaphylaxis, Itching, Swelling, Rash and Other (See Comments)    Has patient had a PCN reaction causing immediate rash, facial/tongue/throat swelling, SOB or lightheadedness with hypotension: Yes Has patient had a PCN reaction causing severe rash involving mucus membranes or skin necrosis: No Has patient had a PCN reaction that required hospitalization: No Has patient had a PCN reaction occurring within the last 10 years: No If all of the above answers are "NO", then may proceed with Cephalosporin use.  And swelling    Social History    Socioeconomic History   Marital status: Married    Spouse name: Not on file   Number of children: Not on file   Years of education: Not on file   Highest education level: Not on file  Occupational History   Not on file  Tobacco Use   Smoking status: Never    Passive exposure: Never   Smokeless tobacco: Never  Vaping Use   Vaping Use: Never used  Substance and Sexual Activity   Alcohol use: Not Currently    Comment: last drink 2 yrs ago   Drug use: No   Sexual activity: Not Currently    Partners: Male, Female    Birth control/protection: None    Comment: female partner  Other Topics Concern   Not on file  Social History Narrative   Not on file   Social Determinants of Health   Financial Resource Strain: Not on file  Food Insecurity: Not on file  Transportation Needs: Not on file  Physical Activity: Not on file  Stress: Not on file  Social Connections: Not on file  Intimate Partner Violence: Not on file    Family History  Problem Relation Age of Onset   Hypertension Mother    Hypertension Father    Diabetes Paternal Grandmother    Lung cancer Paternal Grandfather        smoked   Cancer Paternal Grandfather    Seizures Paternal Grandfather    Breast cancer Maternal Aunt     Past Surgical History:  Procedure  Laterality Date   CHOLECYSTECTOMY  04/03/2012   Procedure: LAPAROSCOPIC CHOLECYSTECTOMY WITH INTRAOPERATIVE CHOLANGIOGRAM;  Surgeon: Velora Heckler, MD;  Location: [facility];  Service: General;  Laterality: N/A;   ERCP  03/12/2012   Procedure: ENDOSCOPIC RETROGRADE CHOLANGIOPANCREATOGRAPHY (ERCP);  Surgeon: Petra Kuba, MD;  Location: [facility];  Service: Endoscopy;  Laterality: N/A;   INGUINAL HERNIA REPAIR N/A 05/15/2013   Procedure: HERNIA REPAIR INCISIONAL;  Surgeon: Velora Heckler, MD;  Location: [facility];  Service: General;  Laterality: N/A;   INSERTION OF MESH N/A 05/15/2013   Procedure: INSERTION OF MESH;  Surgeon: Velora Heckler, MD;   Location: [facility];  Service: General;  Laterality: N/A;   URETERAL STENT PLACEMENT     WISDOM TOOTH EXTRACTION      ROS: Review of Systems Negative except as stated above  PHYSICAL EXAM: BP 95/62 (BP Location: Left Arm, Patient Position: Sitting, Cuff Size: Large)   Pulse 77   Temp 98.6 F (37 C)   Resp 16   Ht 5' 2.99" (1.6 m)   Wt 262 lb (118.8 kg)   LMP 12/14/2022   SpO2 96%   BMI 46.42 kg/m   Physical Exam HENT:     Head: Normocephalic and atraumatic.     Right Ear: Tympanic membrane, ear canal and external ear normal.     Left Ear: Tympanic membrane, ear canal and external ear normal.     Nose: Nose normal.     Mouth/Throat:     Mouth: Mucous membranes are moist.     Pharynx: Oropharynx is clear.  Eyes:     Extraocular Movements: Extraocular movements intact.     Conjunctiva/sclera: Conjunctivae normal.     Pupils: Pupils are equal, round, and reactive to light.  Cardiovascular:     Rate and Rhythm: Normal rate and regular rhythm.     Pulses: Normal pulses.     Heart sounds: Normal heart sounds.  Pulmonary:     Effort: Pulmonary effort is normal.     Breath sounds: Normal breath sounds.  Chest:     Comments: Patient declined.  Abdominal:     General: Bowel sounds are normal.     Palpations: Abdomen is soft.  Genitourinary:    Comments: Patient declined. Musculoskeletal:        General: Normal range of motion.     Right shoulder: Normal.     Left shoulder: Normal.     Right upper arm: Normal.     Left upper arm: Normal.     Right elbow: Normal.     Left elbow: Normal.     Right forearm: Normal.     Left forearm: Normal.     Right wrist: Normal.     Left wrist: Normal.     Right hand: Normal.     Left hand: Normal.     Cervical back: Normal, normal range of motion and neck supple.     Thoracic back: Normal.     Lumbar back: Normal.     Right hip: Normal.     Left hip: Normal.     Right upper leg: Normal.     Left upper leg:  Normal.     Right knee: Normal.     Left knee: Normal.     Right lower leg: Normal.     Left lower leg: Normal.     Right ankle: Normal.     Left ankle: Normal.     Right foot:  Normal.     Left foot: Normal.  Skin:    General: Skin is warm and dry.     Capillary Refill: Capillary refill takes less than 2 seconds.  Neurological:     General: No focal deficit present.     Mental Status: She is alert and oriented to person, place, and time.  Psychiatric:        Mood and Affect: Mood normal.        Behavior: Behavior normal.      ASSESSMENT AND PLAN: 1. Annual physical exam - Counseled on 150 minutes of exercise per week as tolerated, healthy eating (including decreased daily intake of saturated fats, cholesterol, added sugars, sodium), STI prevention, and routine healthcare maintenance.  2. Diabetes mellitus screening - Routine screening.  - Hemoglobin A1c  3. Screening cholesterol level - Routine screening.  - Lipid panel  4. Primary hypertension 5. Tachycardia - Decrease Propranolol from 10 mg twice daily to 10 mg once daily to trial and see if side effects subside. No refills needed as of present.  - Counseled on blood pressure goal of less than 130/80, low-sodium, DASH diet, medication compliance, and 150 minutes of moderate intensity exercise per week as tolerated. Counseled on medication adherence and adverse effects. - Follow-up with primary provider in 2 to 4 weeks or sooner if needed for blood pressure check.   6. Weight gain 7. Class 3 severe obesity with serious comorbidity and body mass index (BMI) of 45.0 to 49.9 in adult, unspecified obesity type [facility]) - Referral to Medical Weight Management for further evaluation/management.  - Amb Ref to Medical Weight Management   Patient was given the opportunity to ask questions.  Patient verbalized understanding of the plan and was able to repeat key elements of the plan. Patient was given clear instructions to go to  Emergency Department or return to medical center if symptoms don't improve, worsen, or new problems develop.The patient verbalized understanding.   Orders Placed This Encounter  Procedures   Hemoglobin A1c   Lipid panel   Amb Ref to Medical Weight Management    Return in about 1 year (around 01/04/2024) for Physical per patient preference.  Rema Fendt, NP

## 2023-01-04 ENCOUNTER — Telehealth: Payer: Self-pay

## 2023-01-04 ENCOUNTER — Ambulatory Visit (INDEPENDENT_AMBULATORY_CARE_PROVIDER_SITE_OTHER): Payer: Medicaid Other | Admitting: Family

## 2023-01-04 ENCOUNTER — Encounter: Payer: Self-pay | Admitting: Family

## 2023-01-04 VITALS — BP 95/62 | HR 77 | Temp 98.6°F | Resp 16 | Ht 62.99 in | Wt 262.0 lb

## 2023-01-04 DIAGNOSIS — R000 Tachycardia, unspecified: Secondary | ICD-10-CM

## 2023-01-04 DIAGNOSIS — I10 Essential (primary) hypertension: Secondary | ICD-10-CM

## 2023-01-04 DIAGNOSIS — Z131 Encounter for screening for diabetes mellitus: Secondary | ICD-10-CM

## 2023-01-04 DIAGNOSIS — Z13220 Encounter for screening for lipoid disorders: Secondary | ICD-10-CM | POA: Diagnosis not present

## 2023-01-04 DIAGNOSIS — Z0000 Encounter for general adult medical examination without abnormal findings: Secondary | ICD-10-CM

## 2023-01-04 DIAGNOSIS — R635 Abnormal weight gain: Secondary | ICD-10-CM

## 2023-01-04 DIAGNOSIS — Z0001 Encounter for general adult medical examination with abnormal findings: Secondary | ICD-10-CM

## 2023-01-04 DIAGNOSIS — E66813 Obesity, class 3: Secondary | ICD-10-CM

## 2023-01-04 DIAGNOSIS — Z6841 Body Mass Index (BMI) 40.0 and over, adult: Secondary | ICD-10-CM

## 2023-01-04 NOTE — Patient Instructions (Signed)

## 2023-01-04 NOTE — Telephone Encounter (Unsigned)
Copied from [place] [id number]. Topic: General - Other >> Jan 04, 2023 11:56 AM Everette C wrote: Reason for CRM: The patient has called to request a letter of documentation for their emotional support animal (cat) to be able to stay at their apartment   Please contact the patient further when possible

## 2023-01-04 NOTE — Progress Notes (Signed)
Pt presents for annual physical -Cari Mayers, PA started pt on Propanolol on 12/18/22 due to elevated BP reading -pt has been feeling lethargic, lightheaded, and fatigue at times since starting medication

## 2023-01-04 NOTE — Telephone Encounter (Signed)
Copied from [place] [id number]. Topic: General - Other >> Jan 04, 2023 11:56 AM Everette C wrote: Reason for CRM: The patient has called to request a letter of documentation for their emotional support animal (cat) to be able to stay at their apartment   Please contact the patient further when possible   Spoke to pt advised that provider does not complete emotional support letters pt will need to be referred to behavioral health specialist, pt states that she will like that because her mental health is not good right now

## 2023-01-04 NOTE — Telephone Encounter (Signed)
error 

## 2023-01-05 ENCOUNTER — Other Ambulatory Visit: Payer: Self-pay | Admitting: Family

## 2023-01-05 DIAGNOSIS — F439 Reaction to severe stress, unspecified: Secondary | ICD-10-CM

## 2023-01-05 LAB — HEMOGLOBIN A1C
Est. average glucose Bld gHb Est-mCnc: 103 mg/dL
Hgb A1c MFr Bld: 5.2 % (ref 4.8–5.6)

## 2023-01-05 LAB — LIPID PANEL
Chol/HDL Ratio: 3.1 ratio (ref 0.0–4.4)
Cholesterol, Total: 120 mg/dL (ref 100–199)
HDL: 39 mg/dL — ABNORMAL LOW (ref >39)
LDL Chol Calc (NIH): 59 mg/dL (ref 0–99)
Triglycerides: 119 mg/dL (ref 0–149)
VLDL Cholesterol Cal: 22 mg/dL (ref 5–40)

## 2023-01-05 NOTE — Telephone Encounter (Signed)
Order complete. Expect call within 2 weeks with appointment details.

## 2023-01-07 ENCOUNTER — Telehealth: Payer: Medicaid Other | Admitting: Physician Assistant

## 2023-01-07 DIAGNOSIS — R3989 Other symptoms and signs involving the genitourinary system: Secondary | ICD-10-CM

## 2023-01-08 MED ORDER — NITROFURANTOIN MONOHYD MACRO 100 MG PO CAPS: 100.0000 mg | ORAL_CAPSULE | Freq: Two times a day (BID) | ORAL | 0 refills | Status: DC

## 2023-01-08 NOTE — Progress Notes (Signed)
E-Visit for Urinary Problems  We are sorry that you are not feeling well.  Here is how we plan to help!  Based on what you shared with me it looks like you most likely have a simple urinary tract infection.  A UTI (Urinary Tract Infection) is a bacterial infection of the bladder.  Most cases of urinary tract infections are simple to treat but a key part of your care is to encourage you to drink plenty of fluids and watch your symptoms carefully.  I have prescribed MacroBid 100 mg twice a day for 5 days.  Your symptoms should gradually improve. Call us if the burning in your urine worsens, you develop worsening fever, back pain or pelvic pain or if your symptoms do not resolve after completing the antibiotic.  Urinary tract infections can be prevented by drinking plenty of water to keep your body hydrated.  Also be sure when you wipe, wipe from front to back and don't hold it in!  If possible, empty your bladder every 4 hours.  HOME CARE Drink plenty of fluids Compete the full course of the antibiotics even if the symptoms resolve Remember, when you need to go.go. Holding in your urine can increase the likelihood of getting a UTI! GET HELP RIGHT AWAY IF: You cannot urinate You get a high fever Worsening back pain occurs You see blood in your urine You feel sick to your stomach or throw up You feel like you are going to pass out  MAKE SURE YOU  Understand these instructions. Will watch your condition. Will get help right away if you are not doing well or get worse.   Thank you for choosing an e-visit.  Your e-visit answers were reviewed by a board certified advanced clinical practitioner to complete your personal care plan. Depending upon the condition, your plan could have included both over the counter or prescription medications.  Please review your pharmacy choice. Make sure the pharmacy is open so you can pick up prescription now. If there is a problem, you may contact your  provider through MyChart messaging and have the prescription routed to another pharmacy.  Your safety is important to us. If you have drug allergies check your prescription carefully.   For the next 24 hours you can use MyChart to ask questions about today's visit, request a non-urgent call back, or ask for a work or school excuse. You will get an email in the next two days asking about your experience. I hope that your e-visit has been valuable and will speed your recovery.  I have spent 5 minutes in review of e-visit questionnaire, review and updating patient chart, medical decision making and response to patient.   Levert Heslop M Averey Koning, PA-C  

## 2023-01-12 ENCOUNTER — Telehealth: Payer: Medicaid Other | Admitting: Family Medicine

## 2023-01-12 DIAGNOSIS — M545 Low back pain, unspecified: Secondary | ICD-10-CM

## 2023-01-12 MED ORDER — CYCLOBENZAPRINE HCL 10 MG PO TABS: 10.0000 mg | ORAL_TABLET | Freq: Three times a day (TID) | ORAL | 0 refills | Status: DC | PRN

## 2023-01-12 MED ORDER — NAPROXEN 500 MG PO TABS: 500.0000 mg | ORAL_TABLET | Freq: Two times a day (BID) | ORAL | 0 refills | Status: DC

## 2023-01-12 NOTE — Progress Notes (Signed)

## 2023-01-20 ENCOUNTER — Telehealth: Payer: Medicaid Other | Admitting: Family Medicine

## 2023-01-20 DIAGNOSIS — M5416 Radiculopathy, lumbar region: Secondary | ICD-10-CM

## 2023-01-20 MED ORDER — PREDNISONE 20 MG PO TABS: 20.0000 mg | ORAL_TABLET | Freq: Two times a day (BID) | ORAL | 0 refills | Status: AC

## 2023-01-20 NOTE — Progress Notes (Signed)
We are sorry that you are not feeling well.  Here is how we plan to help!  Based on what you have shared with me it looks like you mostly have acute back pain.  Acute back pain is defined as musculoskeletal pain that can resolve in 1-3 weeks with conservative treatment.  I have prescribed  prednisone. Continue flexeril. Some patients experience stomach irritation or in increased heartburn with anti-inflammatory drugs.  Please keep in mind that muscle relaxer's can cause fatigue and should not be taken while at work or driving.  Back pain is very common.  The pain often gets better over time.  The cause of back pain is usually not dangerous.  Most people can learn to manage their back pain on their own.  Home Care Stay active.  Start with short walks on flat ground if you can.  Try to walk farther each day. Do not sit, drive or stand in one place for more than 30 minutes.  Do not stay in bed. Do not avoid exercise or work.  Activity can help your back heal faster. Be careful when you bend or lift an object.  Bend at your knees, keep the object close to you, and do not twist. Sleep on a firm mattress.  Lie on your side, and bend your knees.  If you lie on your back, put a pillow under your knees. Only take medicines as told by your doctor. Put ice on the injured area. Put ice in a plastic bag Place a towel between your skin and the bag Leave the ice on for 15-20 minutes, 3-4 times a day for the first 2-3 days. 210 After that, you can switch between ice and heat packs. Ask your doctor about back exercises or massage. Avoid feeling anxious or stressed.  Find good ways to deal with stress, such as exercise.  Get Help Right Way If: Your pain does not go away with rest or medicine. Your pain does not go away in 1 week. You have new problems. You do not feel well. The pain spreads into your legs. You cannot control when you poop (bowel movement) or pee (urinate) You feel sick to your stomach  (nauseous) or throw up (vomit) You have belly (abdominal) pain. You feel like you may pass out (faint). If you develop a fever.  Make Sure you: Understand these instructions. Will watch your condition Will get help right away if you are not doing well or get worse.  Your e-visit answers were reviewed by a board certified advanced clinical practitioner to complete your personal care plan.  Depending on the condition, your plan could have included both over the counter or prescription medications.  If there is a problem please reply  once you have received a response from your provider.  Your safety is important to [place].  If you have drug allergies check your prescription carefully.    You can use MyChart to ask questions about today's visit, request a non-urgent call back, or ask for a work or school excuse for 24 hours related to this e-Visit. If it has been greater than 24 hours you will need to follow up with your provider, or enter a new e-Visit to address those concerns.  You will get an e-mail in the next two days asking about your experience.  I hope that your e-visit has been valuable and will speed your recovery. Thank you for using e-visits.    have provided 5 minutes of non face to  face time during this encounter for chart review and documentation.

## 2023-01-22 ENCOUNTER — Encounter: Payer: Medicaid Other | Admitting: Family

## 2023-01-25 ENCOUNTER — Ambulatory Visit: Payer: Medicaid Other | Admitting: Family

## 2023-01-26 ENCOUNTER — Telehealth: Payer: Medicaid Other | Admitting: Family Medicine

## 2023-01-26 DIAGNOSIS — J301 Allergic rhinitis due to pollen: Secondary | ICD-10-CM

## 2023-01-26 MED ORDER — PREDNISONE 20 MG PO TABS: 20.0000 mg | ORAL_TABLET | Freq: Two times a day (BID) | ORAL | 0 refills | Status: AC

## 2023-01-26 NOTE — Progress Notes (Signed)
E visit for Allergic Rhinitis We are sorry that you are not feeling well.  Here is how we plan to help!  Based on what you have shared with me it looks like you have Allergic Rhinitis.  Rhinitis is when a reaction occurs that causes nasal congestion, runny nose, sneezing, and itching.  Most types of rhinitis are caused by an inflammation and are associated with symptoms in the eyes ears or throat. There are several types of rhinitis.  The most common are acute rhinitis, which is usually caused by a viral illness, allergic or seasonal rhinitis, and nonallergic or year-round rhinitis.  Nasal allergies occur certain times of the year.  Allergic rhinitis is caused when allergens in the air trigger the release of histamine in the body.  Histamine causes itching, swelling, and fluid to build up in the fragile linings of the nasal passages, sinuses and eyelids.  An itchy nose and clear discharge are common.  I am sending a short course of prednisone. Continue otc zyrtec and flonase.   HOME CARE:  You can use an over-the-counter saline nasal spray as needed Avoid areas where there is heavy dust, mites, or molds Stay indoors on windy days during the pollen season Keep windows closed in home, at least in bedroom; use air conditioner. Use high-efficiency house air filter Keep windows closed in car, turn AC on re-circulate Avoid playing out with dog during pollen season  GET HELP RIGHT AWAY IF:  If your symptoms do not improve within 10 days You become short of breath You develop yellow or green discharge from your nose for over 3 days You have coughing fits  MAKE SURE YOU:  Understand these instructions Will watch your condition Will get help right away if you are not doing well or get worse  Thank you for choosing an e-visit. Your e-visit answers were reviewed by a board certified advanced clinical practitioner to complete your personal care plan. Depending upon the condition, your plan could  have included both over the counter or prescription medications. Please review your pharmacy choice. Be sure that the pharmacy you have chosen is open so that you can pick up your prescription now.  If there is a problem you may message your provider in MyChart to have the prescription routed to another pharmacy. Your safety is important to [place]. If you have drug allergies check your prescription carefully.  For the next 24 hours, you can use MyChart to ask questions about today's visit, request a non-urgent call back, or ask for a work or school excuse from your e-visit provider. You will get an email in the next two days asking about your experience. I hope that your e-visit has been valuable and will speed your recovery.    have provided 5 minutes of non face to face time during this encounter for chart review and documentation.

## 2023-01-30 ENCOUNTER — Telehealth: Payer: Medicaid Other | Admitting: Physician Assistant

## 2023-01-30 ENCOUNTER — Encounter: Payer: Self-pay | Admitting: Obstetrics and Gynecology

## 2023-01-30 DIAGNOSIS — J029 Acute pharyngitis, unspecified: Secondary | ICD-10-CM | POA: Diagnosis not present

## 2023-01-30 NOTE — Progress Notes (Signed)
E-Visit for Sore Throat  We are sorry that you are not feeling well.  Here is how we plan to help!  Your symptoms indicate a likely viral infection (Pharyngitis).   Pharyngitis is inflammation in the back of the throat which can cause a sore throat, scratchiness and sometimes difficulty swallowing.   Pharyngitis is typically caused by a respiratory virus and will just run its course.  Please keep in mind that your symptoms could last up to 10 days.  For throat pain, we recommend over the counter oral pain relief medications such as acetaminophen or aspirin, or anti-inflammatory medications such as ibuprofen or naproxen sodium.  Topical treatments such as oral throat lozenges or sprays may be used as needed.  Avoid close contact with loved ones, especially the very young and elderly.  Remember to wash your hands thoroughly throughout the day as this is the number one way to prevent the spread of infection and wipe down door knobs and counters with disinfectant.  After careful review of your answers, I would not recommend an antibiotic for your condition.  Antibiotics should not be used to treat conditions that we suspect are caused by viruses like the virus that causes the common cold or flu. However, some people can have Strep with atypical symptoms. You may need formal testing in clinic or office to confirm if your symptoms continue or worsen.  Providers prescribe antibiotics to treat infections caused by bacteria. Antibiotics are very powerful in treating bacterial infections when they are used properly.  To maintain their effectiveness, they should be used only when necessary.  Overuse of antibiotics has resulted in the development of super bugs that are resistant to treatment!    Home Care: Only take medications as instructed by your medical team. Do not drink alcohol while taking these medications. A steam or ultrasonic humidifier can help congestion.  You can place a towel over your head and  breathe in the steam from hot water coming from a faucet. Avoid close contacts especially the very young and the elderly. Cover your mouth when you cough or sneeze. Always remember to wash your hands.  Get Help Right Away If: You develop worsening fever or throat pain. You develop a severe head ache or visual changes. Your symptoms persist after you have completed your treatment plan.  Make sure you Understand these instructions. Will watch your condition. Will get help right away if you are not doing well or get worse.   Thank you for choosing an e-visit.  Your e-visit answers were reviewed by a board certified advanced clinical practitioner to complete your personal care plan. Depending upon the condition, your plan could have included both over the counter or prescription medications.  Please review your pharmacy choice. Make sure the pharmacy is open so you can pick up prescription now. If there is a problem, you may contact your provider through MyChart messaging and have the prescription routed to another pharmacy.  Your safety is important to us. If you have drug allergies check your prescription carefully.   For the next 24 hours you can use MyChart to ask questions about today's visit, request a non-urgent call back, or ask for a work or school excuse. You will get an email in the next two days asking about your experience. I hope that your e-visit has been valuable and will speed your recovery.  I have spent 5 minutes in review of e-visit questionnaire, review and updating patient chart, medical decision making and response   to patient.   Harolyn Cocker M Jone Panebianco, PA-C  

## 2023-01-31 ENCOUNTER — Telehealth: Payer: Self-pay | Admitting: *Deleted

## 2023-01-31 NOTE — Telephone Encounter (Signed)
TC from pt reporting onset of heavy VB on 01/29/23. Recently started Aygestin for AUB. Pt states medication has made bleeding worse. Pt reports wearing incontinence type diapers and changing full diapers every 2 hours and passing clots the size of her palm. Reports dizziness and generally feeling poorly. Advised to seek care in the emergency room for evaluation of VS and blood loss. Plan for virtual visit at a later time with Dr. Berton Lan to further discuss AUB management. Call transferred to schedulers. Pt to go to ED following call.

## 2023-02-01 ENCOUNTER — Emergency Department (HOSPITAL_COMMUNITY): Payer: Medicaid Other

## 2023-02-01 ENCOUNTER — Emergency Department (HOSPITAL_COMMUNITY)
Admission: EM | Admit: 2023-02-01 | Discharge: 2023-02-01 | Discharge status: Against Medical Advice | Payer: Medicaid Other | Attending: Emergency Medicine | Admitting: Emergency Medicine

## 2023-02-01 ENCOUNTER — Telehealth: Payer: Medicaid Other | Admitting: Physician Assistant

## 2023-02-01 DIAGNOSIS — N921 Excessive and frequent menstruation with irregular cycle: Secondary | ICD-10-CM | POA: Diagnosis not present

## 2023-02-01 DIAGNOSIS — J029 Acute pharyngitis, unspecified: Secondary | ICD-10-CM | POA: Diagnosis present

## 2023-02-01 DIAGNOSIS — R109 Unspecified abdominal pain: Secondary | ICD-10-CM | POA: Insufficient documentation

## 2023-02-01 DIAGNOSIS — J020 Streptococcal pharyngitis: Secondary | ICD-10-CM | POA: Diagnosis not present

## 2023-02-01 DIAGNOSIS — N939 Abnormal uterine and vaginal bleeding, unspecified: Secondary | ICD-10-CM | POA: Diagnosis not present

## 2023-02-01 DIAGNOSIS — Z20822 Contact with and (suspected) exposure to covid-19: Secondary | ICD-10-CM | POA: Insufficient documentation

## 2023-02-01 DIAGNOSIS — R110 Nausea: Secondary | ICD-10-CM

## 2023-02-01 LAB — URINALYSIS, ROUTINE W REFLEX MICROSCOPIC
Bilirubin Urine: NEGATIVE
Glucose, UA: NEGATIVE mg/dL
Ketones, ur: NEGATIVE mg/dL
Nitrite: NEGATIVE
Protein, ur: 30 mg/dL — AB
Specific Gravity, Urine: 1.029 (ref 1.005–1.030)
WBC, UA: >50 WBC/hpf (ref 0–5)
pH: 5 (ref 5.0–8.0)

## 2023-02-01 LAB — RESP PANEL BY RT-PCR (RSV, FLU A&B, COVID)  RVPGX2
Influenza A by PCR: NEGATIVE
Influenza B by PCR: NEGATIVE
Resp Syncytial Virus by PCR: NEGATIVE
SARS Coronavirus 2 by RT PCR: NEGATIVE

## 2023-02-01 LAB — GROUP A STREP BY PCR: Group A Strep by PCR: DETECTED — AB

## 2023-02-01 LAB — PREGNANCY, URINE: Preg Test, Ur: NEGATIVE

## 2023-02-01 MED ORDER — CLINDAMYCIN HCL 150 MG PO CAPS: 450.0000 mg | ORAL_CAPSULE | Freq: Three times a day (TID) | ORAL | 0 refills | Status: DC

## 2023-02-01 MED ORDER — ONDANSETRON 4 MG PO TBDP
4.0000 mg | ORAL_TABLET | Freq: Three times a day (TID) | ORAL | 0 refills | Status: DC | PRN
Start: 2023-02-01 — End: 2023-02-02

## 2023-02-01 MED ORDER — ACETAMINOPHEN 325 MG PO TABS
650.0000 mg | ORAL_TABLET | Freq: Once | ORAL | Status: AC | PRN
Start: 2023-02-01 — End: 2023-02-01
  Administered 2023-02-01: 650 mg via ORAL
  Filled 2023-02-01: qty 2

## 2023-02-01 NOTE — Progress Notes (Deleted)
Patient ID: [name], female    DOB: 09-07-1996  MRN: [id number]  CC: Chronic Care Management   Subjective: [name] is a 27 y.o. female who presents for chronic care management.   Her concerns today include:  HTN - Propranolol decreased from 10 mg twice daily to 10 mg once daily to see if side effects subside. Emergency Department on 02/01/2023.   Patient Active Problem List   Diagnosis Date Noted   Abnormal Pap smear of cervix 01/03/2023   Low back pain 02/24/2022   Request for sterilization 08/28/2019   Status post hernia repair 03/26/2019     Current Outpatient Medications on File Prior to Visit  Medication Sig Dispense Refill   cyclobenzaprine (FLEXERIL) 10 MG tablet Take 1 tablet (10 mg total) by mouth 3 (three) times daily as needed for muscle spasms. 30 tablet 0   naproxen (NAPROSYN) 500 MG tablet Take 1 tablet (500 mg total) by mouth 2 (two) times daily with a meal. 30 tablet 0   norethindrone (AYGESTIN) 5 MG tablet Take 1 tablet (5 mg total) by mouth daily. 90 tablet 3   propranolol (INDERAL) 10 MG tablet Take 1 tablet (10 mg total) by mouth 2 (two) times daily. 60 tablet 1   No current facility-administered medications on file prior to visit.    Allergies  Allergen Reactions   Penicillins Anaphylaxis, Itching, Swelling, Rash and Other (See Comments)    Has patient had a PCN reaction causing immediate rash, facial/tongue/throat swelling, SOB or lightheadedness with hypotension: Yes Has patient had a PCN reaction causing severe rash involving mucus membranes or skin necrosis: No Has patient had a PCN reaction that required hospitalization: No Has patient had a PCN reaction occurring within the last 10 years: No If all of the above answers are "NO", then may proceed with Cephalosporin use.  And swelling    Social History   Socioeconomic History   Marital status: Married    Spouse name: Not on file   Number of children: Not on file   Years of  education: Not on file   Highest education level: Not on file  Occupational History   Not on file  Tobacco Use   Smoking status: Never    Passive exposure: Never   Smokeless tobacco: Never  Vaping Use   Vaping Use: Never used  Substance and Sexual Activity   Alcohol use: Not Currently    Comment: last drink 2 yrs ago   Drug use: No   Sexual activity: Not Currently    Partners: Male, Female    Birth control/protection: None    Comment: female partner  Other Topics Concern   Not on file  Social History Narrative   Not on file   Social Determinants of Health   Financial Resource Strain: Not on file  Food Insecurity: Not on file  Transportation Needs: Not on file  Physical Activity: Not on file  Stress: Not on file  Social Connections: Not on file  Intimate Partner Violence: Not on file    Family History  Problem Relation Age of Onset   Hypertension Mother    Hypertension Father    Diabetes Paternal Grandmother    Lung cancer Paternal Grandfather        smoked   Cancer Paternal Grandfather    Seizures Paternal Grandfather    Breast cancer Maternal Aunt     Past Surgical History:  Procedure Laterality Date   CHOLECYSTECTOMY  04/03/2012   Procedure: LAPAROSCOPIC  CHOLECYSTECTOMY WITH INTRAOPERATIVE CHOLANGIOGRAM;  Surgeon: Velora Heckler, MD;  Location: [facility];  Service: General;  Laterality: N/A;   ERCP  03/12/2012   Procedure: ENDOSCOPIC RETROGRADE CHOLANGIOPANCREATOGRAPHY (ERCP);  Surgeon: Petra Kuba, MD;  Location: [facility] OR;  Service: Endoscopy;  Laterality: N/A;   INGUINAL HERNIA REPAIR N/A 05/15/2013   Procedure: HERNIA REPAIR INCISIONAL;  Surgeon: Velora Heckler, MD;  Location: [facility];  Service: General;  Laterality: N/A;   INSERTION OF MESH N/A 05/15/2013   Procedure: INSERTION OF MESH;  Surgeon: Velora Heckler, MD;  Location: [facility];  Service: General;  Laterality: N/A;   URETERAL STENT PLACEMENT     WISDOM TOOTH EXTRACTION       ROS: Review of Systems Negative except as stated above  PHYSICAL EXAM: LMP 02/01/2023   Physical Exam  {female adult master:310786} {female adult master:[id number]}     Latest Ref Rng & Units 12/18/2022    1:50 PM 04/28/2022   10:16 AM 09/12/2021    4:22 PM  CMP  Glucose 70 - 99 mg/dL 85  92  88   BUN 6 - 20 mg/dL 10  13  9    Creatinine 0.57 - 1.00 mg/dL 4.81  8.59  0.93   Sodium 134 - 144 mmol/L 142  138  138   Potassium 3.5 - 5.2 mmol/L 4.1  4.5  3.8   Chloride 96 - 106 mmol/L 103  106  102   CO2 20 - 29 mmol/L  17  28   Calcium 8.7 - 10.2 mg/dL 9.7  9.2  8.8   Total Protein 6.0 - 8.5 g/dL 7.3  7.2    Total Bilirubin 0.0 - 1.2 mg/dL <1.1  0.2    Alkaline Phos 44 - 121 IU/L 113  104    AST 0 - 40 IU/L 26  20    ALT 0 - 32 IU/L  23     Lipid Panel     Component Value Date/Time   CHOL 120 01/04/2023 1101   TRIG 119 01/04/2023 1101   HDL 39 (L) 01/04/2023 1101   CHOLHDL 3.1 01/04/2023 1101   LDLCALC 59 01/04/2023 1101    CBC    Component Value Date/Time   WBC 9.5 12/18/2022 1350   WBC 6.0 09/12/2021 1622   RBC 4.49 12/18/2022 1350   RBC 4.48 09/12/2021 1622   HGB 14.5 12/18/2022 1350   HGB 13.8 08/21/2011 0000   HCT 41.4 12/18/2022 1350   HCT 39 08/21/2011 0000   PLT 297 12/18/2022 1350   PLT 273 08/21/2011 0000   MCV 92 12/18/2022 1350   MCH 32.3 12/18/2022 1350   MCH 31.5 09/12/2021 1622   MCHC 35.0 12/18/2022 1350   MCHC 34.0 09/12/2021 1622   RDW 13.1 12/18/2022 1350   LYMPHSABS 3.0 12/18/2022 1350   MONOABS 0.5 04/14/2021 2304   EOSABS 0.1 12/18/2022 1350   BASOSABS 0.0 12/18/2022 1350    ASSESSMENT AND PLAN:  There are no diagnoses linked to this encounter.   Patient was given the opportunity to ask questions.  Patient verbalized understanding of the plan and was able to repeat key elements of the plan. Patient was given clear instructions to go to Emergency Department or return to medical center if symptoms don't improve, worsen, or new  problems develop.The patient verbalized understanding.   No orders of the defined types were placed in this encounter.    Requested Prescriptions    No prescriptions requested or  ordered in this encounter    No follow-ups on file.  Camillia Herter, NP

## 2023-02-01 NOTE — Progress Notes (Signed)
E-Visit for Nausea and Vomiting   We are sorry that you are not feeling well. Here is how we plan to help!  Based on what you have shared with me it looks like you have a Virus that is irritating your GI tract.  Vomiting is the forceful emptying of a portion of the stomach's content through the mouth.  Although nausea and vomiting can make you feel miserable, it's important to remember that these are not diseases, but rather symptoms of an underlying illness.  When we treat short term symptoms, we always caution that any symptoms that persist should be fully evaluated in a medical office.  I have prescribed a medication that will help alleviate your symptoms and allow you to stay hydrated:  Zofran 4 mg 1 tablet every 8 hours as needed for nausea and vomiting  HOME CARE: Drink clear liquids.  This is very important! Dehydration (the lack of fluid) can lead to a serious complication.  Start off with 1 tablespoon every 5 minutes for 8 hours. You may begin eating bland foods after 8 hours without vomiting.  Start with saltine crackers, white bread, rice, mashed potatoes, applesauce. After 48 hours on a bland diet, you may resume a normal diet. Try to go to sleep.  Sleep often empties the stomach and relieves the need to vomit.  GET HELP RIGHT AWAY IF:  Your symptoms do not improve or worsen within 2 days after treatment. You have a fever for over 3 days. You cannot keep down fluids after trying the medication.  MAKE SURE YOU:  Understand these instructions. Will watch your condition. Will get help right away if you are not doing well or get worse.    Thank you for choosing an e-visit.  Your e-visit answers were reviewed by a board certified advanced clinical practitioner to complete your personal care plan. Depending upon the condition, your plan could have included both over the counter or prescription medications.  Please review your pharmacy choice. Make sure the pharmacy is open so  you can pick up prescription now. If there is a problem, you may contact your provider through MyChart messaging and have the prescription routed to another pharmacy.  Your safety is important to us. If you have drug allergies check your prescription carefully.   For the next 24 hours you can use MyChart to ask questions about today's visit, request a non-urgent call back, or ask for a work or school excuse. You will get an email in the next two days asking about your experience. I hope that your e-visit has been valuable and will speed your recovery.  I have spent 5 minutes in review of e-visit questionnaire, review and updating patient chart, medical decision making and response to patient.   Minerva Bluett M Safari Cinque, PA-C  

## 2023-02-01 NOTE — ED Notes (Signed)
Patient states getting stuck for blood increases her anxiety and she no longer wants it done.

## 2023-02-01 NOTE — ED Provider Notes (Signed)
[place] EMERGENCY DEPARTMENT AT [facility] Provider Note   CSN: [id number] Arrival date & time: 02/01/23  0107     History  Chief Complaint  Patient presents with   Sore Throat    [name] is a 27 y.o. female with PMH LGSIL on pap, anxiety, depression, ovarian cysts, presents to the ED with multiple complaints.  She states that she is having a sore throat and noticed exudates on her tonsils and was concerned for strep throat so came to the ED for further evaluation of this.  She denies difficulty swallowing, difficulty breathing, chest pain, fever, chills, nausea, or vomiting.  No known sick contacts.  No associated cough or congestion.  She also states that she started her menstrual cycle and has been having heavy vaginal bleeding.  She reports passing a large clot yesterday.  She states that for the last year she has had heavy menstrual cycles and was instructed by her OB to come to ED for further evaluation.  History of ovarian cyst several years ago.  She states that she is in a monogamous relationship with a female but is not currently sexually active and has no concern for STDs.  She denies vaginal discharge.  She reports mild left lower quadrant pain similar to the pain that she had with a previous ovarian cyst.  No history of significant anemia requiring blood transfusion. No headaches, weakness, vision changes, or syncope.       Home Medications Prior to Admission medications   Medication Sig Start Date End Date Taking? Authorizing Provider  clindamycin (CLEOCIN) 150 MG capsule Take 3 capsules (450 mg total) by mouth 3 (three) times daily for 10 days. 02/01/23 02/11/23 Yes Luciel Brickman L, PA-C  cyclobenzaprine (FLEXERIL) 10 MG tablet Take 1 tablet (10 mg total) by mouth 3 (three) times daily as needed for muscle spasms. 01/12/23   Delorse Lek, FNP  naproxen (NAPROSYN) 500 MG tablet Take 1 tablet (500 mg total) by mouth 2 (two) times daily with a meal.  01/12/23   Delorse Lek, FNP  norethindrone (AYGESTIN) 5 MG tablet Take 1 tablet (5 mg total) by mouth daily. 01/03/23   Lennart Pall, MD  ondansetron (ZOFRAN-ODT) 4 MG disintegrating tablet Take 1 tablet (4 mg total) by mouth every 8 (eight) hours as needed. 02/01/23   Margaretann Loveless, PA-C  propranolol (INDERAL) 10 MG tablet Take 1 tablet (10 mg total) by mouth 2 (two) times daily. 12/18/22   Mayers, Cari S, PA-C      Allergies    Penicillins    Review of Systems   Review of Systems  All other systems reviewed and are negative.   Physical Exam Updated Vital Signs BP (!) 145/97   Pulse 87   Temp 98 F (36.7 C) (Oral)   Resp 18   Ht 5\' 2"  (1.575 m)   Wt 117.9 kg   LMP 02/01/2023   SpO2 98%   BMI 47.55 kg/m  Physical Exam Vitals and nursing note reviewed.  Constitutional:      General: She is not in acute distress.    Appearance: Normal appearance. She is not ill-appearing, toxic-appearing or diaphoretic.  HENT:     Head: Normocephalic and atraumatic.     Mouth/Throat:     Mouth: Mucous membranes are moist. No oral lesions or angioedema.     Tongue: Tongue does not deviate from midline.     Pharynx: Uvula midline. Posterior oropharyngeal erythema (moderate posterior pharynx)  present. No pharyngeal swelling or uvula swelling.     Tonsils: Tonsillar exudate (minimal bilateral) present. No tonsillar abscesses. 1+ on the right. 1+ on the left.  Eyes:     Conjunctiva/sclera: Conjunctivae normal.  Neck:     Vascular: No JVD.     Trachea: Phonation normal.     Comments: No meningismus Cardiovascular:     Rate and Rhythm: Normal rate and regular rhythm.     Heart sounds: No murmur heard. Pulmonary:     Effort: Pulmonary effort is normal. No respiratory distress.     Breath sounds: Normal breath sounds. No stridor. No wheezing, rhonchi or rales.  Abdominal:     General: Abdomen is flat. There is no distension.     Palpations: Abdomen is soft. There is no mass.      Tenderness: There is abdominal tenderness (minimal LLQ). There is no guarding or rebound.     Hernia: No hernia is present.  Musculoskeletal:        General: Normal range of motion.     Cervical back: Normal range of motion and neck supple. No rigidity.     Right lower leg: No edema.     Left lower leg: No edema.  Lymphadenopathy:     Cervical: No cervical adenopathy.  Skin:    General: Skin is warm and dry.     Capillary Refill: Capillary refill takes less than 2 seconds.     Findings: No rash.  Neurological:     Mental Status: She is alert and oriented to person, place, and time. Mental status is at baseline.  Psychiatric:        Mood and Affect: Mood is anxious.        Speech: Speech normal.        Behavior: Behavior is cooperative.        Judgment: Judgment normal.     ED Results / Procedures / Treatments   Labs (all labs ordered are listed, but only abnormal results are displayed) Labs Reviewed  GROUP A STREP BY PCR - Abnormal; Notable for the following components:      Result Value   Group A Strep by PCR DETECTED (*)    All other components within normal limits  URINALYSIS, ROUTINE W REFLEX MICROSCOPIC - Abnormal; Notable for the following components:   Color, Urine AMBER (*)    APPearance CLOUDY (*)    Hgb urine dipstick LARGE (*)    Protein, ur 30 (*)    Leukocytes,Ua LARGE (*)    Bacteria, UA FEW (*)    All other components within normal limits  RESP PANEL BY RT-PCR (RSV, FLU A&B, COVID)  RVPGX2  PREGNANCY, URINE    EKG None  Radiology US Pelvis Complete  Result Date: 02/01/2023 CLINICAL DATA:  Premenopausal female with abnormal uterine bleeding for 1 week. EXAM: TRANSABDOMINAL AND TRANSVAGINAL ULTRASOUND OF PELVIS TECHNIQUE: Both transabdominal and transvaginal ultrasound examinations of the pelvis were performed. Transabdominal technique was performed for global imaging of the pelvis including uterus, ovaries, adnexal regions, and pelvic cul-de-sac. It was  necessary to proceed with endovaginal exam following the transabdominal exam to visualize the ovaries. COMPARISON:  None Available. FINDINGS: Uterus Measurements: 8.3 x 4.2 x 5.4 cm = volume: 98.2 mL. No fibroids or other mass visualized. Endometrium Thickness: 4 mm.  No focal abnormality visualized. Right ovary Measurements: 2.9 x 1.7 x 1.8 cm = volume: 4.48 mL. Normal appearance/no adnexal mass. Left ovary Measurements: 2.3 x 1.7 by 1.8 cm =  volume: 3.7 mL. Normal appearance/no adnexal mass. Other findings No abnormal free fluid. Diminished exam detail secondary to body habitus. IMPRESSION: 1. No acute findings and no explanation for patient's uterine bleeding. 2. If bleeding remains unresponsive to hormonal or medical therapy, sonohysterogram should be considered for focal lesion work-up. (Ref: Radiological Reasoning: Algorithmic Workup of Abnormal Vaginal Bleeding with Endovaginal Sonography and Sonohysterography. AJR 2008; 161:W96-04; 191:S68-73) Electronically Signed   By: Signa Kellaylor  Stroud M.D.   On: 02/01/2023 07:49   US Transvaginal Non-OB  Result Date: 02/01/2023 CLINICAL DATA:  Premenopausal female with abnormal uterine bleeding for 1 week. EXAM: TRANSABDOMINAL AND TRANSVAGINAL ULTRASOUND OF PELVIS TECHNIQUE: Both transabdominal and transvaginal ultrasound examinations of the pelvis were performed. Transabdominal technique was performed for global imaging of the pelvis including uterus, ovaries, adnexal regions, and pelvic cul-de-sac. It was necessary to proceed with endovaginal exam following the transabdominal exam to visualize the ovaries. COMPARISON:  None Available. FINDINGS: Uterus Measurements: 8.3 x 4.2 x 5.4 cm = volume: 98.2 mL. No fibroids or other mass visualized. Endometrium Thickness: 4 mm.  No focal abnormality visualized. Right ovary Measurements: 2.9 x 1.7 x 1.8 cm = volume: 4.48 mL. Normal appearance/no adnexal mass. Left ovary Measurements: 2.3 x 1.7 by 1.8 cm = volume: 3.7 mL. Normal  appearance/no adnexal mass. Other findings No abnormal free fluid. Diminished exam detail secondary to body habitus. IMPRESSION: 1. No acute findings and no explanation for patient's uterine bleeding. 2. If bleeding remains unresponsive to hormonal or medical therapy, sonohysterogram should be considered for focal lesion work-up. (Ref: Radiological Reasoning: Algorithmic Workup of Abnormal Vaginal Bleeding with Endovaginal Sonography and Sonohysterography. AJR 2008; 540:J81-19; 191:S68-73) Electronically Signed   By: Signa Kellaylor  Stroud M.D.   On: 02/01/2023 07:49    Procedures Procedures   Medications Ordered in ED Medications  acetaminophen (TYLENOL) tablet 650 mg (650 mg Oral Given 02/01/23 0128)    ED Course/ Medical Decision Making/ A&P                             Medical Decision Making Amount and/or Complexity of Data Reviewed Labs: ordered. Decision-making details documented in ED Course. Radiology: ordered. Decision-making details documented in ED Course.   Medical Decision Making:   [name] is a 27 y.o. female who presented to the ED today with sore throat/vaginal bleeding/abdominal pain detailed above.    Patient's presentation is complicated by their history of ovarian cyst, abnormal pap.  Patient placed on continuous vitals and telemetry monitoring while in ED which was reviewed periodically.  Complete initial physical exam performed, notably the patient was in no acute distress.  Posterior pharyngeal erythema with tonsillar exudates. 1+ tonsils bilaterally. No evidence PTA or RPA. Patent airway. No signs respiratory distress. Minimal LLQ tenderness. Abdomen soft, non-distended, and without peritoneal signs. Normal skin pigmentation for ethnicity. Normal conjunctiva. Neurologically intact.   Reviewed and confirmed nursing documentation for past medical history, family history, social history.    Initial Assessment:   With the patient's presentation of sore throat, differential  diagnosis includes but is not limited to strep pharyngitis, viral pharyngitis, COVID-19, influenza, RSV, PTA, RPA, meningitis, pneumonia, seasonal allergies, viral URI. With the patient's presentation of vaginal bleeding/abdominal pain, differential diagnosis includes but is not limited to acute abdomen, menorrhagia, dysmenorrhea, ovarian cyst, ovarian torsion, STD, PID, pregnancy (ectopic vs incomplete vs threatened), dysfunctional uterine bleeding, complications including anemia.  This is most consistent with an acute complicated illness  Initial Plan:  Screening labs including CBC and Metabolic panel to evaluate for infectious or metabolic etiology of disease. - declined Urinalysis with reflex culture ordered to evaluate for UTI or relevant urologic/nephrologic pathology.  Lipase to evaluate for pancreatitis - declined Pregnancy testing Viral and strep swabs Pelvic US for evaluation of vaginal bleeding Pain control - declined Pelvic exam - declined STD testing - declined Objective evaluation as below reviewed   Initial Study Results:   Laboratory  All laboratory results reviewed without evidence of clinically relevant pathology.   Exceptions include: positive strep swab, UA appears contaminated  Radiology:  All images reviewed independently. Agree with radiology report at this time.   US Pelvis Complete  Result Date: 02/01/2023 CLINICAL DATA:  Premenopausal female with abnormal uterine bleeding for 1 week. EXAM: TRANSABDOMINAL AND TRANSVAGINAL ULTRASOUND OF PELVIS TECHNIQUE: Both transabdominal and transvaginal ultrasound examinations of the pelvis were performed. Transabdominal technique was performed for global imaging of the pelvis including uterus, ovaries, adnexal regions, and pelvic cul-de-sac. It was necessary to proceed with endovaginal exam following the transabdominal exam to visualize the ovaries. COMPARISON:  None Available. FINDINGS: Uterus Measurements: 8.3 x 4.2 x 5.4 cm =  volume: 98.2 mL. No fibroids or other mass visualized. Endometrium Thickness: 4 mm.  No focal abnormality visualized. Right ovary Measurements: 2.9 x 1.7 x 1.8 cm = volume: 4.48 mL. Normal appearance/no adnexal mass. Left ovary Measurements: 2.3 x 1.7 by 1.8 cm = volume: 3.7 mL. Normal appearance/no adnexal mass. Other findings No abnormal free fluid. Diminished exam detail secondary to body habitus. IMPRESSION: 1. No acute findings and no explanation for patient's uterine bleeding. 2. If bleeding remains unresponsive to hormonal or medical therapy, sonohysterogram should be considered for focal lesion work-up. (Ref: Radiological Reasoning: Algorithmic Workup of Abnormal Vaginal Bleeding with Endovaginal Sonography and Sonohysterography. AJR 2008; 762:G31-51) Electronically Signed   By: Signa Kell M.D.   On: 02/01/2023 07:49   US Transvaginal Non-OB  Result Date: 02/01/2023 CLINICAL DATA:  Premenopausal female with abnormal uterine bleeding for 1 week. EXAM: TRANSABDOMINAL AND TRANSVAGINAL ULTRASOUND OF PELVIS TECHNIQUE: Both transabdominal and transvaginal ultrasound examinations of the pelvis were performed. Transabdominal technique was performed for global imaging of the pelvis including uterus, ovaries, adnexal regions, and pelvic cul-de-sac. It was necessary to proceed with endovaginal exam following the transabdominal exam to visualize the ovaries. COMPARISON:  None Available. FINDINGS: Uterus Measurements: 8.3 x 4.2 x 5.4 cm = volume: 98.2 mL. No fibroids or other mass visualized. Endometrium Thickness: 4 mm.  No focal abnormality visualized. Right ovary Measurements: 2.9 x 1.7 x 1.8 cm = volume: 4.48 mL. Normal appearance/no adnexal mass. Left ovary Measurements: 2.3 x 1.7 by 1.8 cm = volume: 3.7 mL. Normal appearance/no adnexal mass. Other findings No abnormal free fluid. Diminished exam detail secondary to body habitus. IMPRESSION: 1. No acute findings and no explanation for patient's uterine  bleeding. 2. If bleeding remains unresponsive to hormonal or medical therapy, sonohysterogram should be considered for focal lesion work-up. (Ref: Radiological Reasoning: Algorithmic Workup of Abnormal Vaginal Bleeding with Endovaginal Sonography and Sonohysterography. AJR 2008; 761:Y07-37) Electronically Signed   By: Signa Kell M.D.   On: 02/01/2023 07:49      Final Assessment and Plan:   This is a 27 year old female who presents to ED with multiple complaints. States primarily was concerned for sore throat and possibility of strep. No known sick contacts. No shortness of breath, chest pain, or fever. On exam, she has posterior pharyngeal erythema with 1+ tonsils  with exudates but widely patent airway without signs of respiratory distress, airway obstruction. No evidence PTA or RPA. Lungs clear to auscultation. Viral swabs negative. Strep positive. With anaphylactic allergy to penicillins, will start on Clindamycin. Discussed possible side effects of this medication. Tylenol given earlier for pain, pt declines further pain control. Pt also c/o menorrhagia with irregular cycles, referred here by her OB/GYN. Remote history of ovarian cyst, some minimal associated LLQ cramping that feels similar. Minimal LLQ abdominal tenderness. Abdomen soft, non-distended, no CVA tenderness or peritoneal signs. Low suspicion for acute abdomen. With symptoms, however, did recommend further evaluation with blood work and ultrasound. No acute findings identified on ultrasound. Multiple attempts at obtaining blood work unsuccessful by Education administrator. Attempted to order IV team consult but as no active IV orders/medications placed they will not see pt though I do believe she would have benefitted from this to obtain blood work and for any potential blood administration. Pt refusing further attempts at blood draw by ED staff. She requests to be discharged home and declines further workup including blood work, pelvic exam, STD  testing, etc. She does appear hemodynamically stable with no signs of intractable tachycardia or hypotension. States not currently sexually active. Low suspicion for PID. Discussed risks of declining further evaluation and workup with pt who expressed understanding and will sign out against medical advice. Discussed need to closely follow up with PCP or OB/GYN for further evaluation of vaginal bleeding, malignancy rule out, etc. Pt given strict ED return precautions, all questions answered, and stable at time of disposition.    Clinical Impression:  1. Strep pharyngitis   2. Menorrhagia with irregular cycle      AMA    Final Clinical Impression(s) / ED Diagnoses Final diagnoses:  Strep pharyngitis  Menorrhagia with irregular cycle    Rx / DC Orders ED Discharge Orders          Ordered    clindamycin (CLEOCIN) 150 MG capsule  3 times daily        02/01/23 0901              Tonette Lederer, PA-C 02/01/23 Clyda Hurdle, MD 02/02/23 [phone]

## 2023-02-01 NOTE — ED Notes (Signed)
Attempted labs x 2 but unsuccessful. RN made aware.

## 2023-02-01 NOTE — Discharge Instructions (Signed)
You tested positive for strep throat.  I am starting you on an antibiotic to treat this.  Please take this as prescribed and as we discussed.  Please follow-up with your PCP and/your OB/GYN to discuss your heavy menstrual cycles.  If you do not want to see your previous PCP, I provided 2 additional primary care clinics that you may call to schedule a follow-up appointment.  We were unable to get blood work to assess your blood counts today.  Your exam and vital signs are reassuring.  The ultrasound was normal.  You do not appear to have a UTI.  However, without the blood work it is not possible to assess your blood counts and thus for any worsening symptoms you should immediately return to emergency department for reevaluation.

## 2023-02-01 NOTE — Progress Notes (Signed)
Secure chat with Delsa Grana re IV Team consult.  No IV meds or fluids ordered at this time.  States pt refusing labs. VSS.  No  need for PIV at this time.

## 2023-02-01 NOTE — ED Triage Notes (Signed)
Patient arrived with complaints of sore throat since yesterday. States she also has menstrual cramps and passed a large blood clot earlier but bleeding has now subsided, states her obgyn referred her to ED.

## 2023-02-02 ENCOUNTER — Ambulatory Visit (INDEPENDENT_AMBULATORY_CARE_PROVIDER_SITE_OTHER): Payer: Medicaid Other | Admitting: Family

## 2023-02-02 ENCOUNTER — Telehealth: Payer: Self-pay

## 2023-02-02 ENCOUNTER — Other Ambulatory Visit (HOSPITAL_COMMUNITY)
Admission: RE | Admit: 2023-02-02 | Discharge: 2023-02-02 | Disposition: A | Discharge status: Home / Self Care | Payer: Medicaid Other | Source: Ambulatory Visit | Attending: Family | Admitting: Family

## 2023-02-02 ENCOUNTER — Encounter: Payer: Self-pay | Admitting: Family

## 2023-02-02 ENCOUNTER — Ambulatory Visit: Payer: Self-pay

## 2023-02-02 VITALS — BP 136/81 | HR 125 | Temp 98.6°F | Resp 16 | Ht 62.0 in | Wt 258.2 lb

## 2023-02-02 DIAGNOSIS — Z113 Encounter for screening for infections with a predominantly sexual mode of transmission: Secondary | ICD-10-CM

## 2023-02-02 DIAGNOSIS — R000 Tachycardia, unspecified: Secondary | ICD-10-CM

## 2023-02-02 DIAGNOSIS — J020 Streptococcal pharyngitis: Secondary | ICD-10-CM

## 2023-02-02 DIAGNOSIS — R110 Nausea: Secondary | ICD-10-CM

## 2023-02-02 DIAGNOSIS — I10 Essential (primary) hypertension: Secondary | ICD-10-CM

## 2023-02-02 MED ORDER — ONDANSETRON 4 MG PO TBDP
4.0000 mg | ORAL_TABLET | Freq: Three times a day (TID) | ORAL | 0 refills | Status: DC | PRN
Start: 2023-02-02 — End: 2023-02-26

## 2023-02-02 NOTE — Telephone Encounter (Signed)
Summary: BP concern   The patient has been seen for their BP recently which was 136/84 checked at 9 AM  The patient would like to speak with a member of clinical staff when possible regarding their BP concerns  Please contact the patient further when possible      Called pt - left message on machine.

## 2023-02-02 NOTE — Telephone Encounter (Signed)
I connected with  [name] on 02/02/23 by telephone and verified that I am speaking with the correct person using two identifiers.   Patient sent My Chart message (see message) requesting clinical staff to call her. Pt had questions about the urinalysis. Advised pt to follow up with PCP regarding urinalysis and blood pressure.

## 2023-02-02 NOTE — Progress Notes (Signed)
Pt is here for blood pressure check   Complaining of trouble eating and drinking, states when she eat she immediately feels nauseous started Yesterday   Complaining of upper quadrant abdominal pain started X2 days ago

## 2023-02-02 NOTE — Telephone Encounter (Signed)
     Chief Complaint: Pt. Concerned about BP and having protein in her urine yesterday at [facility]. Symptoms: Above Frequency: Yesterday Pertinent Negatives: Patient denies  Disposition: [] ED /[] Urgent Care (no appt availability in office) / [] Appointment(In office/virtual)/ []  [place] [facility]/ [] Home Care/ [] Refused Recommended Disposition /[] [facility]/ [x]  Follow-up with PCP Additional Notes: Has appointment today.   Answer Assessment - Initial Assessment Questions 1. BLOOD PRESSURE: "What is the blood pressure?" "Did you take at least two measurements 5 minutes apart?"     136/84 2. ONSET: "When did you take your blood pressure?"     Yesterday 3. HOW: "How did you take your blood pressure?" (e.g., automatic home BP monitor, visiting nurse)     Nurse 4. HISTORY: "Do you have a history of high blood pressure?"     Yes 5. MEDICINES: "Are you taking any medicines for blood pressure?" "Have you missed any doses recently?"     Yes 6. OTHER SYMPTOMS: "Do you have any symptoms?" (e.g., blurred vision, chest pain, difficulty breathing, headache, weakness)     Had protein in urine yesterday 7. PREGNANCY: "Is there any chance you are pregnant?" "When was your last menstrual period?"     No  Protocols used: Blood Pressure - High-A-AH

## 2023-02-02 NOTE — Progress Notes (Signed)
Patient ID: [name], female    DOB: 07-10-1996  MRN: [id number]  CC: Blood Pressure Check   Subjective: [name] is a 27 y.o. female who presents for blood pressure check.   Her concerns today include:  02/01/2023 [facility] Emergency Department at [facility] per MD note: Final Assessment and Plan:   This is a 27 year old female who presents to ED with multiple complaints. States primarily was concerned for sore throat and possibility of strep. No known sick contacts. No shortness of breath, chest pain, or fever. On exam, she has posterior pharyngeal erythema with 1+ tonsils with exudates but widely patent airway without signs of respiratory distress, airway obstruction. No evidence PTA or RPA. Lungs clear to auscultation. Viral swabs negative. Strep positive. With anaphylactic allergy to penicillins, will start on Clindamycin. Discussed possible side effects of this medication. Tylenol given earlier for pain, pt declines further pain control. Pt also c/o menorrhagia with irregular cycles, referred here by her OB/GYN. Remote history of ovarian cyst, some minimal associated LLQ cramping that feels similar. Minimal LLQ abdominal tenderness. Abdomen soft, non-distended, no CVA tenderness or peritoneal signs. Low suspicion for acute abdomen. With symptoms, however, did recommend further evaluation with blood work and ultrasound. No acute findings identified on ultrasound. Multiple attempts at obtaining blood work unsuccessful by Education administrator. Attempted to order IV team consult but as no active IV orders/medications placed they will not see pt though I do believe she would have benefitted from this to obtain blood work and for any potential blood administration. Pt refusing further attempts at blood draw by ED staff. She requests to be discharged home and declines further workup including blood work, pelvic exam, STD testing, etc. She does appear hemodynamically stable with no  signs of intractable tachycardia or hypotension. States not currently sexually active. Low suspicion for PID. Discussed risks of declining further evaluation and workup with pt who expressed understanding and will sign out against medical advice. Discussed need to closely follow up with PCP or OB/GYN for further evaluation of vaginal bleeding, malignancy rule out, etc. Pt given strict ED return precautions, all questions answered, and stable at time of disposition.      Clinical Impression:  1. Strep pharyngitis   2. Menorrhagia with irregular cycle    Today's visit 02/02/2023: - Since last visit doing well on blood pressure medication, no issues/concerns. She is checking her blood pressures outside of office and says they are "up and down". She denies red flag symptoms such as but not limited to chest pain, shortness of breath, worst headache of life, nausea/vomiting.  - Reports she went to [facility] on today and prescribed Azithromycin for management of strep pharyngitis. Reports she had a reaction to the initial Cleocin prescribed at [facility] Emergency Department on yesterday. Reports still having trouble eating and drinking which causes nausea. She denies additional symptoms. - She is aware of her upcoming appointment with Gynecology on 02/26/2023. - No further issues/concerns for discussion today.   Patient Active Problem List   Diagnosis Date Noted   Abnormal Pap smear of cervix 01/03/2023   Low back pain 02/24/2022   Request for sterilization 08/28/2019   Status post hernia repair 03/26/2019     Current Outpatient Medications on File Prior to Visit  Medication Sig Dispense Refill   azithromycin (ZITHROMAX) 250 MG tablet Take 250 mg by mouth daily.     norethindrone (AYGESTIN) 5 MG tablet Take 1 tablet (  5 mg total) by mouth daily. 90 tablet 3   propranolol (INDERAL) 10 MG tablet Take 1 tablet (10 mg total) by mouth 2 (two) times daily. 60 tablet 1   clindamycin  (CLEOCIN) 150 MG capsule Take 3 capsules (450 mg total) by mouth 3 (three) times daily for 10 days. (Patient not taking: Reported on 02/02/2023) 90 capsule 0   No current facility-administered medications on file prior to visit.    Allergies  Allergen Reactions   Penicillins Anaphylaxis, Itching, Swelling, Rash and Other (See Comments)    Has patient had a PCN reaction causing immediate rash, facial/tongue/throat swelling, SOB or lightheadedness with hypotension: Yes Has patient had a PCN reaction causing severe rash involving mucus membranes or skin necrosis: No Has patient had a PCN reaction that required hospitalization: No Has patient had a PCN reaction occurring within the last 10 years: No If all of the above answers are "NO", then may proceed with Cephalosporin use.  And swelling   Clindamycin/Lincomycin Itching and Swelling    Social History   Socioeconomic History   Marital status: Married    Spouse name: Not on file   Number of children: Not on file   Years of education: Not on file   Highest education level: Not on file  Occupational History   Not on file  Tobacco Use   Smoking status: Never    Passive exposure: Never   Smokeless tobacco: Never  Vaping Use   Vaping Use: Never used  Substance and Sexual Activity   Alcohol use: Not Currently    Comment: last drink 2 yrs ago   Drug use: No   Sexual activity: Not Currently    Partners: Male, Female    Birth control/protection: None    Comment: female partner  Other Topics Concern   Not on file  Social History Narrative   Not on file   Social Determinants of Health   Financial Resource Strain: Not on file  Food Insecurity: Not on file  Transportation Needs: Not on file  Physical Activity: Not on file  Stress: Not on file  Social Connections: Not on file  Intimate Partner Violence: Not on file    Family History  Problem Relation Age of Onset   Hypertension Mother    Hypertension Father    Diabetes  Paternal Grandmother    Lung cancer Paternal Grandfather        smoked   Cancer Paternal Grandfather    Seizures Paternal Grandfather    Breast cancer Maternal Aunt     Past Surgical History:  Procedure Laterality Date   CHOLECYSTECTOMY  04/03/2012   Procedure: LAPAROSCOPIC CHOLECYSTECTOMY WITH INTRAOPERATIVE CHOLANGIOGRAM;  Surgeon: Velora Heckler, MD;  Location: [facility];  Service: General;  Laterality: N/A;   ERCP  03/12/2012   Procedure: ENDOSCOPIC RETROGRADE CHOLANGIOPANCREATOGRAPHY (ERCP);  Surgeon: Petra Kuba, MD;  Location: [facility] OR;  Service: Endoscopy;  Laterality: N/A;   INGUINAL HERNIA REPAIR N/A 05/15/2013   Procedure: HERNIA REPAIR INCISIONAL;  Surgeon: Velora Heckler, MD;  Location: [facility];  Service: General;  Laterality: N/A;   INSERTION OF MESH N/A 05/15/2013   Procedure: INSERTION OF MESH;  Surgeon: Velora Heckler, MD;  Location: [facility];  Service: General;  Laterality: N/A;   URETERAL STENT PLACEMENT     WISDOM TOOTH EXTRACTION      ROS: Review of Systems Negative except as stated above  PHYSICAL EXAM: BP 136/81 (BP Location: Left Arm, Patient Position:  Sitting, Cuff Size: Large)   Pulse (!) 125   Temp 98.6 F (37 C)   Resp 16   Ht  (1.575 m)   Wt 258 lb 3.2 oz (117.1 kg)   LMP 02/01/2023   SpO2 97%   BMI 47.23 kg/m   Physical Exam HENT:     Head: Normocephalic and atraumatic.  Eyes:     Extraocular Movements: Extraocular movements intact.     Conjunctiva/sclera: Conjunctivae normal.     Pupils: Pupils are equal, round, and reactive to light.  Cardiovascular:     Rate and Rhythm: Tachycardia present.     Pulses: Normal pulses.     Heart sounds: Normal heart sounds.  Pulmonary:     Effort: Pulmonary effort is normal.     Breath sounds: Normal breath sounds.  Musculoskeletal:     Cervical back: Normal range of motion and neck supple.  Neurological:     General: No focal deficit present.     Mental Status: She is  alert and oriented to person, place, and time.  Psychiatric:        Mood and Affect: Mood normal.        Behavior: Behavior normal.      ASSESSMENT AND PLAN: 1. Primary hypertension 2. Tachycardia - Continue Propranolol as prescribed. No refills needed as of present.  - Counseled on blood pressure goal of less than 130/80, low-sodium, DASH diet, medication compliance, and 150 minutes of moderate intensity exercise per week as tolerated. Counseled on medication adherence and adverse effects. - Tachycardia persisting. Patient asymptomatic. Referral to Cardiology for further evaluation/management. During the interim follow-up with primary provider as scheduled.  - Ambulatory referral to Cardiology  3. Routine screening for STI (sexually transmitted infection) - Routine screening.  - Cervicovaginal ancillary only  4. Strep pharyngitis 5. Nausea - Continue Azithromycin as prescribed.  - Ondansetron as prescribed. Counseled on medication adherence.  - Follow-up with primary provider as scheduled. - ondansetron (ZOFRAN-ODT) 4 MG disintegrating tablet; Take 1 tablet (4 mg total) by mouth every 8 (eight) hours as needed for nausea or vomiting.  Dispense: 30 tablet; Refill: 0   Patient was given the opportunity to ask questions.  Patient verbalized understanding of the plan and was able to repeat key elements of the plan. Patient was given clear instructions to go to Emergency Department or return to medical center if symptoms don't improve, worsen, or new problems develop.The patient verbalized understanding.   Orders Placed This Encounter  Procedures   Ambulatory referral to Cardiology     Requested Prescriptions   Signed Prescriptions Disp Refills   ondansetron (ZOFRAN-ODT) 4 MG disintegrating tablet 30 tablet 0    Sig: Take 1 tablet (4 mg total) by mouth every 8 (eight) hours as needed for nausea or vomiting.    Follow-up with primary provider as scheduled.   Rema Fendt, NP

## 2023-02-04 ENCOUNTER — Telehealth: Payer: Medicaid Other | Admitting: Family

## 2023-02-04 DIAGNOSIS — R110 Nausea: Secondary | ICD-10-CM

## 2023-02-04 DIAGNOSIS — J020 Streptococcal pharyngitis: Secondary | ICD-10-CM

## 2023-02-04 DIAGNOSIS — R5383 Other fatigue: Secondary | ICD-10-CM

## 2023-02-04 NOTE — Progress Notes (Signed)
Virtual Visit Consent   [name], you are scheduled for a virtual visit with a [facility] provider today. Just as with appointments in the office, your consent must be obtained to participate. Your consent will be active for this visit and any virtual visit you may have with one of our providers in the next 365 days. If you have a MyChart account, a copy of this consent can be sent to you electronically.  As this is a virtual visit, video technology does not allow for your provider to perform a traditional examination. This may limit your provider's ability to fully assess your condition. If your provider identifies any concerns that need to be evaluated in person or the need to arrange testing (such as labs, EKG, etc.), we will make arrangements to do so. Although advances in technology are sophisticated, we cannot ensure that it will always work on either your end or our end. If the connection with a video visit is poor, the visit may have to be switched to a telephone visit. With either a video or telephone visit, we are not always able to ensure that we have a secure connection.  By engaging in this virtual visit, you consent to the provision of healthcare and authorize for your insurance to be billed (if applicable) for the services provided during this visit. Depending on your insurance coverage, you may receive a charge related to this service.  I need to obtain your verbal consent now. Are you willing to proceed with your visit today? Candace Congetta Reihl has provided verbal consent on 02/04/2023 for a virtual visit (video or telephone). Jannifer Rodney, FNP  Date: 02/04/2023 6:40 PM  Virtual Visit via Video Note   I, Jannifer Rodney, connected with  [name]  ([id number], 29-Jan-1996) on 02/04/23 at  6:30 PM EDT by a video-enabled telemedicine application and verified that I am speaking with the correct person using two identifiers.  Location: Patient: Virtual Visit  Location Patient: Home Provider: Virtual Visit Location Provider: Home Office   I discussed the limitations of evaluation and management by telemedicine and the availability of in person appointments. The patient expressed understanding and agreed to proceed.    History of Present Illness: [name] is a 27 y.o. who identifies as a female who was assigned female at birth, and is being seen today for sore throat. She reports she went to the ED on 02/01/23 and was given clindamycin for one day. She stopped because of itching. Then went Urgent Care the next day and was prescribed Zpak. She reports she has taken this for 3 days. However, feels nausea, weakness, GERD, and fatigue after taking Zpak. She was given   HPI: HPI  Problems:  Patient Active Problem List   Diagnosis Date Noted   Abnormal Pap smear of cervix 01/03/2023   Low back pain 02/24/2022   Request for sterilization 08/28/2019   Status post hernia repair 03/26/2019    Allergies:  Allergies  Allergen Reactions   Penicillins Anaphylaxis, Itching, Swelling, Rash and Other (See Comments)    Has patient had a PCN reaction causing immediate rash, facial/tongue/throat swelling, SOB or lightheadedness with hypotension: Yes Has patient had a PCN reaction causing severe rash involving mucus membranes or skin necrosis: No Has patient had a PCN reaction that required hospitalization: No Has patient had a PCN reaction occurring within the last 10 years: No If all of the above answers are "NO", then may proceed with Cephalosporin use.  And  swelling   Clindamycin/Lincomycin Itching and Swelling   Medications:  Current Outpatient Medications:    norethindrone (AYGESTIN) 5 MG tablet, Take 1 tablet (5 mg total) by mouth daily., Disp: 90 tablet, Rfl: 3   ondansetron (ZOFRAN-ODT) 4 MG disintegrating tablet, Take 1 tablet (4 mg total) by mouth every 8 (eight) hours as needed for nausea or vomiting., Disp: 30 tablet, Rfl: 0    propranolol (INDERAL) 10 MG tablet, Take 1 tablet (10 mg total) by mouth 2 (two) times daily., Disp: 60 tablet, Rfl: 1  Observations/Objective: Patient is well-developed, well-nourished in no acute distress.  Resting comfortably  at home.  Head is normocephalic, atraumatic.  No labored breathing.  Speech is clear and coherent with logical content.  Patient is alert and oriented at baseline.    Assessment and Plan: 1. Strep pharyngitis  2. Other fatigue  3. Nausea  PT will take try to take one dose tomorrow. IF symptoms continues she can hold off on zpak. States her sore throat and fevers have resolved.  New toothbrush Force fluids Wash all cups  Follow up if symptoms worsen or do not improve   Follow Up Instructions: I discussed the assessment and treatment plan with the patient. The patient was provided an opportunity to ask questions and all were answered. The patient agreed with the plan and demonstrated an understanding of the instructions.  A copy of instructions were sent to the patient via MyChart unless otherwise noted below.    The patient was advised to call back or seek an in-person evaluation if the symptoms worsen or if the condition fails to improve as anticipated.  Time:  I spent 8 minutes with the patient via telehealth technology discussing the above problems/concerns.    Jannifer Rodney, FNP

## 2023-02-06 ENCOUNTER — Other Ambulatory Visit: Payer: Self-pay | Admitting: Family

## 2023-02-06 ENCOUNTER — Ambulatory Visit: Payer: Medicaid Other | Admitting: Family

## 2023-02-06 DIAGNOSIS — N760 Acute vaginitis: Secondary | ICD-10-CM

## 2023-02-06 DIAGNOSIS — I10 Essential (primary) hypertension: Secondary | ICD-10-CM

## 2023-02-06 LAB — CERVICOVAGINAL ANCILLARY ONLY
Bacterial Vaginitis (gardnerella): POSITIVE — AB
Candida Glabrata: NEGATIVE
Candida Vaginitis: NEGATIVE
Chlamydia: NEGATIVE
Comment: NEGATIVE
Comment: NEGATIVE
Comment: NEGATIVE
Comment: NEGATIVE
Comment: NEGATIVE
Comment: NORMAL
Neisseria Gonorrhea: NEGATIVE
Trichomonas: NEGATIVE

## 2023-02-06 MED ORDER — METRONIDAZOLE 500 MG PO TABS
500.0000 mg | ORAL_TABLET | Freq: Two times a day (BID) | ORAL | 0 refills | Status: AC
Start: 2023-02-06 — End: 2023-02-13

## 2023-02-07 ENCOUNTER — Encounter: Payer: Self-pay | Admitting: Obstetrics and Gynecology

## 2023-02-18 ENCOUNTER — Telehealth: Payer: Medicaid Other | Admitting: Family Medicine

## 2023-02-18 DIAGNOSIS — K649 Unspecified hemorrhoids: Secondary | ICD-10-CM | POA: Diagnosis not present

## 2023-02-18 MED ORDER — HYDROCORTISONE (PERIANAL) 2.5 % EX CREA
TOPICAL_CREAM | Freq: Two times a day (BID) | CUTANEOUS | Status: AC
Start: 2023-02-18 — End: 2023-03-04

## 2023-02-18 NOTE — Progress Notes (Signed)
E-Visit for Hemorrhoid  We are sorry that you are not feeling well. We are here to help!  Hemorrhoids are swollen veins in the rectum. They can cause itching, bleeding, and pain. Hemorrhoids are very common.  In some cases, you can see or feel hemorrhoids around the outside of the rectum. In other cases, you cannot see them because they are hidden inside the rectum. Be patient - It can take months for this to improve or go away.   Hemorrhoids do not always cause symptoms. But when they do, symptoms can include: ?Itching of the skin around the anus ?Bleeding - Bleeding is usually painless. You might see bright red blood after using the toilet. ?Pain - If a blood clot forms inside a hemorrhoid, this can cause pain. It can also cause a lump that you might be able to feel.   What can I do to keep from getting more hemorrhoids? -- The most important thing you can do is to keep from getting constipated. You should have a bowel movement at least a few times a week. When you have a bowel movement, you also should not have to push too much. Plus, your bowel movements should not be too hard. Being constipated and having hard bowel movements can make hemorrhoids worse.   I have prescribed Topical Hydrocortisone ointment 2.5%.  Apply to area two times per day for 14 days  HOME CARE: Sitz Baths twice daily. Soak buttocks in 2 or 3 inches of warm water for 10 to 15 minutes. Do not add soap, bubble bath, or anything to the water. Stool softener such as Colace 100 mg twice daily AND Miralax 1 scoop daily until you have regular soft stools Over the counter Preparation H Tucks Pads Witch Hazel  Here are some steps you can take to avoid getting constipated or having hard stools:  ?Eat lots of fruits, vegetables, and other foods with fiber. Fiber helps to increase bowel movements. If you do not get enough fiber from your diet, you can take fiber supplements. These come in the form of powders, wafers, or  pills. Some examples are Metamucil, Citrucel, Benefiber and FiberCon. If you take a fiber supplement, be sure to read the label so you know how much to take. If you're not sure, ask your provider or nurse. ?Take medicines called "stool softeners" such as docusate sodium (sample brand names: Colace, Dulcolax). These medicines increase the number of bowel movements you have. They are safe to take and they can prevent problems later.  You should request a referral for a follow up evaluation with a Gastroenterologist (GI doctor) to evaluate this chronic and relapsing condition - even if it improves to see what further steps need to be taken. This is highly linked to chronic constipation and straining to have a bowel movement. It may require further treatment or surgical intervention.   GET HELP RIGHT AWAY IF: You develop severe pain You have heavy bleeding   FOLLOW UP WITH YOUR PRIMARY PROVIDER IF: If your symptoms do not improve within 10 days  MAKE SURE YOU  Understand these instructions. Will watch your condition. Will get help right away if you are not doing well or get worse.   Thank you for choosing an e-visit.  Your e-visit answers were reviewed by a board certified advanced clinical practitioner to complete your personal care plan. Depending upon the condition, your plan could have included both over the counter or prescription medications.  Please review your pharmacy choice. Make  sure the pharmacy is open so you can pick up prescription now. If there is a problem, you may contact your provider through [employer] and have the prescription routed to another pharmacy.  Your safety is important to [place]. If you have drug allergies check your prescription carefully.   For the next 24 hours you can use MyChart to ask questions about today's visit, request a non-urgent call back, or ask for a work or school excuse. You will get an email in the next two days asking about your experience.  I hope that your e-visit has been valuable and will speed your recovery. I have spent 5 minutes in review of e-visit questionnaire, review and updating patient chart, medical decision making and response to patient.   Reed Pandy, PA-C

## 2023-02-25 ENCOUNTER — Ambulatory Visit (INDEPENDENT_AMBULATORY_CARE_PROVIDER_SITE_OTHER): Payer: Medicaid Other

## 2023-02-25 ENCOUNTER — Other Ambulatory Visit: Payer: Self-pay

## 2023-02-25 ENCOUNTER — Ambulatory Visit
Admission: RE | Admit: 2023-02-25 | Discharge: 2023-02-25 | Disposition: A | Discharge status: Home / Self Care | Payer: Medicaid Other | Source: Ambulatory Visit

## 2023-02-25 VITALS — BP 113/72 | HR 84 | Temp 98.7°F | Resp 18

## 2023-02-25 DIAGNOSIS — M79641 Pain in right hand: Secondary | ICD-10-CM | POA: Diagnosis not present

## 2023-02-25 MED ORDER — PREDNISONE 20 MG PO TABS: 40.0000 mg | ORAL_TABLET | Freq: Every day | ORAL | 0 refills | Status: AC

## 2023-02-25 NOTE — ED Triage Notes (Signed)
Pt reports she has swelling in the and limited movement in Rt index,middle and ring fingres for one week. Pt denies any injury.
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
## 2023-02-25 NOTE — Discharge Instructions (Signed)
Please follow-up with hand specialty for further evaluation and management as your x-ray was negative.

## 2023-02-26 ENCOUNTER — Telehealth (INDEPENDENT_AMBULATORY_CARE_PROVIDER_SITE_OTHER): Payer: Medicaid Other | Admitting: Obstetrics and Gynecology

## 2023-02-26 ENCOUNTER — Encounter: Payer: Self-pay | Admitting: Obstetrics and Gynecology

## 2023-02-26 VITALS — BP 141/79 | HR 102

## 2023-02-26 DIAGNOSIS — R030 Elevated blood-pressure reading, without diagnosis of hypertension: Secondary | ICD-10-CM

## 2023-02-26 DIAGNOSIS — N939 Abnormal uterine and vaginal bleeding, unspecified: Secondary | ICD-10-CM

## 2023-02-26 DIAGNOSIS — R87610 Atypical squamous cells of undetermined significance on cytologic smear of cervix (ASC-US): Secondary | ICD-10-CM | POA: Diagnosis not present

## 2023-02-26 MED ORDER — NORETHINDRONE ACETATE 5 MG PO TABS: 15.0000 mg | ORAL_TABLET | Freq: Every day | ORAL | 11 refills | Status: DC

## 2023-02-26 NOTE — Progress Notes (Signed)
Virtual Visit via Telephone Note  I connected with [name] on 02/26/23 at  1:30 PM EDT by telephone and verified that I am speaking with the correct person using two identifiers.  Location: Patient: home Provider: Haskel Khan

## 2023-02-26 NOTE — Progress Notes (Signed)
TELEHEALTH GYNECOLOGY VISIT ENCOUNTER NOTE  Provider location: [facility]   Patient location: Home  I connected with [name] on 02/26/23 at  1:30 PM EDT by  MyChart audiovisual encounter.  History:  [name] is a 27 y.o. [phone] with LMP 02/01/23 presenting for follow up of AUB  I first saw patient 3/13 for AUB. Has longstanding irregular (cycles q28-60d), painful & heavy periods that have been worsening for the past year. Associated nausea, HA, and lightheadedness w/ her cycle. She had already completed a workup including CBC, TSH, and pelvic US that were all normal. Has tried TXA and IUD in past. See note dated 01/03/23 for details.   After discussion of management options, she decided to try aygestin. Started 5mg  daily, but messaged about 1 month later reporting return of heavy bleeding. She was seen in the ED on 4/11 - normal pelvic US at that time, EL 4mm. We increased her aygestin to 15mg  daily. Her bleeding resolved after 12 days. She has not had bleeding since, but is currently having symptoms that make her think she is going to start bleeding soon. Feeling crampy, bloated and having hot flashes.   Due for colposcopy from ASCUS/HPV+ pap. Wants to reschedule colpo.  Sexually active with female partner    Past Medical History:  Diagnosis Date   Anxiety    Chlamydia 2018, 2021   Chronic kidney disease    kidney stones and kidney infection with last pregnancy   Depression    "gets in her moods, currently ok'   Gonorrhea 2018   Headache    Incisional hernia 04/2013   Infection    UTI   LGSIL on Pap smear of cervix 2019   Ovarian cyst    Urethral diverticulum    Past Surgical History:  Procedure Laterality Date   CHOLECYSTECTOMY  04/03/2012   Procedure: LAPAROSCOPIC CHOLECYSTECTOMY WITH INTRAOPERATIVE CHOLANGIOGRAM;  Surgeon: Velora Heckler, MD;  Location: [facility];  Service: General;  Laterality: N/A;   ERCP  03/12/2012    Procedure: ENDOSCOPIC RETROGRADE CHOLANGIOPANCREATOGRAPHY (ERCP);  Surgeon: Petra Kuba, MD;  Location: [facility];  Service: Endoscopy;  Laterality: N/A;   INGUINAL HERNIA REPAIR N/A 05/15/2013   Procedure: HERNIA REPAIR INCISIONAL;  Surgeon: Velora Heckler, MD;  Location: [facility];  Service: General;  Laterality: N/A;   INSERTION OF MESH N/A 05/15/2013   Procedure: INSERTION OF MESH;  Surgeon: Velora Heckler, MD;  Location: [facility];  Service: General;  Laterality: N/A;   URETERAL STENT PLACEMENT     WISDOM TOOTH EXTRACTION     The following portions of the patient's history were reviewed and updated as appropriate: allergies, current medications, past family history, past medical history, past social history, past surgical history and problem list.   Review of Systems:  Pertinent items noted in HPI and remainder of comprehensive ROS otherwise negative.  Physical Exam:   General:  Alert, oriented and cooperative.   Mental Status: Normal mood and affect perceived. Normal judgment and thought content.  Physical exam deferred due to nature of the encounter  Labs and Imaging No results found for this or any previous visit (from the past 336 hour(s)). DG Hand Complete Right  Result Date: 02/25/2023 CLINICAL DATA:  Right hand pain. EXAM: RIGHT HAND - COMPLETE 3+ VIEW COMPARISON:  None Available. FINDINGS: There is no evidence of fracture or dislocation. There is no evidence of arthropathy or other focal bone  abnormality. Soft tissues are unremarkable. IMPRESSION: Negative. Electronically Signed   By: Amie Portland M.D.   On: 02/25/2023 14:12   US Pelvis Complete  Result Date: 02/01/2023 CLINICAL DATA:  Premenopausal female with abnormal uterine bleeding for 1 week. EXAM: TRANSABDOMINAL AND TRANSVAGINAL ULTRASOUND OF PELVIS TECHNIQUE: Both transabdominal and transvaginal ultrasound examinations of the pelvis were performed. Transabdominal technique was performed for  global imaging of the pelvis including uterus, ovaries, adnexal regions, and pelvic cul-de-sac. It was necessary to proceed with endovaginal exam following the transabdominal exam to visualize the ovaries. COMPARISON:  None Available. FINDINGS: Uterus Measurements: 8.3 x 4.2 x 5.4 cm = volume: 98.2 mL. No fibroids or other mass visualized. Endometrium Thickness: 4 mm.  No focal abnormality visualized. Right ovary Measurements: 2.9 x 1.7 x 1.8 cm = volume: 4.48 mL. Normal appearance/no adnexal mass. Left ovary Measurements: 2.3 x 1.7 by 1.8 cm = volume: 3.7 mL. Normal appearance/no adnexal mass. Other findings No abnormal free fluid. Diminished exam detail secondary to body habitus. IMPRESSION: 1. No acute findings and no explanation for patient's uterine bleeding. 2. If bleeding remains unresponsive to hormonal or medical therapy, sonohysterogram should be considered for focal lesion work-up. (Ref: Radiological Reasoning: Algorithmic Workup of Abnormal Vaginal Bleeding with Endovaginal Sonography and Sonohysterography. AJR 2008; 284:X32-44) Electronically Signed   By: Signa Kell M.D.   On: 02/01/2023 07:49   US Transvaginal Non-OB  Result Date: 02/01/2023 CLINICAL DATA:  Premenopausal female with abnormal uterine bleeding for 1 week. EXAM: TRANSABDOMINAL AND TRANSVAGINAL ULTRASOUND OF PELVIS TECHNIQUE: Both transabdominal and transvaginal ultrasound examinations of the pelvis were performed. Transabdominal technique was performed for global imaging of the pelvis including uterus, ovaries, adnexal regions, and pelvic cul-de-sac. It was necessary to proceed with endovaginal exam following the transabdominal exam to visualize the ovaries. COMPARISON:  None Available. FINDINGS: Uterus Measurements: 8.3 x 4.2 x 5.4 cm = volume: 98.2 mL. No fibroids or other mass visualized. Endometrium Thickness: 4 mm.  No focal abnormality visualized. Right ovary Measurements: 2.9 x 1.7 x 1.8 cm = volume: 4.48 mL. Normal  appearance/no adnexal mass. Left ovary Measurements: 2.3 x 1.7 by 1.8 cm = volume: 3.7 mL. Normal appearance/no adnexal mass. Other findings No abnormal free fluid. Diminished exam detail secondary to body habitus. IMPRESSION: 1. No acute findings and no explanation for patient's uterine bleeding. 2. If bleeding remains unresponsive to hormonal or medical therapy, sonohysterogram should be considered for focal lesion work-up. (Ref: Radiological Reasoning: Algorithmic Workup of Abnormal Vaginal Bleeding with Endovaginal Sonography and Sonohysterography. AJR 2008; [id number]:U72-53) Electronically Signed   By: Signa Kell M.D.   On: 02/01/2023 07:49      Assessment and Plan:  26yo [id number] with LMP 02/01/23 presenting for follow up of abnormal uterine bleeding  1. Abnormal uterine bleeding (AUB) - Continue aygestin 15mg  daily. Her bleeding in April may be related to inadequate dose of aygestin (originally on 5mg  daily) - Discussed monitoring bleeding on current dose of aygestin. If heavy bleeding returns, would definitely recommend EMBx. She is due for colposcopy, so we also discussed obtaining EMB at that time.  - Will obtain EMB with upcoming colposcopy regardless of if her bleeding returns - Discussed ED return precautions  2. Elevated blood pressure reading Recheck reassuring Pt will follow up with PCP Discussed ED return precautions  3. Abnormal pap Messaged routed to office to reschedule colposcopy and add EMB to list of planned procedures Emphasized importance of completing colposcopy ASAP   I discussed the assessment and  treatment plan with the patient. The patient was provided an opportunity to ask questions and all were answered. The patient agreed with the plan and demonstrated an understanding of the instructions.   The patient was advised to call back or seek an in-person evaluation/go to the ED if the symptoms worsen or if the condition fails to improve as anticipated.  I provided 22  minutes of non-face-to-face time during this encounter.  Lennart Pall, MD [facility] [facility], [facility]

## 2023-02-28 ENCOUNTER — Encounter: Payer: Medicaid Other | Admitting: Obstetrics and Gynecology

## 2023-03-05 ENCOUNTER — Ambulatory Visit: Payer: Medicaid Other | Attending: Cardiology

## 2023-03-05 ENCOUNTER — Encounter: Payer: Self-pay | Admitting: Cardiology

## 2023-03-05 ENCOUNTER — Ambulatory Visit: Payer: Medicaid Other | Attending: Cardiology | Admitting: Cardiology

## 2023-03-05 VITALS — BP 118/82 | HR 80 | Ht 63.0 in | Wt 257.4 lb

## 2023-03-05 DIAGNOSIS — G90A Postural orthostatic tachycardia syndrome (POTS): Secondary | ICD-10-CM | POA: Diagnosis not present

## 2023-03-05 DIAGNOSIS — R000 Tachycardia, unspecified: Secondary | ICD-10-CM

## 2023-03-05 DIAGNOSIS — I10 Essential (primary) hypertension: Secondary | ICD-10-CM | POA: Diagnosis not present

## 2023-03-05 MED ORDER — PROPRANOLOL HCL 10 MG PO TABS
5.0000 mg | ORAL_TABLET | Freq: Two times a day (BID) | ORAL | 1 refills | Status: DC
Start: 2023-03-05 — End: 2023-08-24

## 2023-03-05 NOTE — Patient Instructions (Addendum)
Medication Instructions:       Change an take Propranolol to 1/2 tablet of 10 mg twice a day instead taking 10 mg daily     *If you need a refill on your cardiac medications before your next appointment, please call your pharmacy*  Other Instructions  Hydrate  Hydrate  - increase intake to 80 oz a day  - mixed liquid like Gatorade , liquid IV -- low sugar for both.  Wear support socks ( traveling socks)    Lab Work: Not needed   Testing/Procedures: WiIl be mailed to you in 3 to 7 days Your physician has recommended that you wear a holter monitor 7 days Zio . Holter monitors are medical devices that record the heart's electrical activity. Doctors most often use these monitors to diagnose arrhythmias. Arrhythmias are problems with the speed or rhythm of the heartbeat. The monitor is a small, portable device. You can wear one while you do your normal daily activities. This is usually used to diagnose what is causing palpitations/syncope (passing out).    Follow-Up: At [facility], you and your health needs are our priority.  As part of our continuing mission to provide you with exceptional heart care, we have created designated Provider Care Teams.  These Care Teams include your primary Cardiologist (physician) and Advanced Practice Providers (APPs -  Physician Assistants and Nurse Practitioners) who all work together to provide you with the care you need, when you need it.  We recommend signing up for the patient portal called "MyChart".  Sign up information is provided on this After Visit Summary.  MyChart is used to connect with patients for Virtual Visits (Telemedicine).  Patients are able to view lab/test results, encounter notes, upcoming appointments, etc.  Non-urgent messages can be sent to your provider as well.   To learn more about what you can do with MyChart, go to [email or web address].    Your next appointment:   6 to 8 week(s)  Provider:   Bryan Lemma, MD      Other Instructions  Hydrate  Hydrate  - increase intake to 80 oz a day  - mixed liquid like Gatorade , liquid IV -- low sugar for both.  Wear support socks ( traveling socks)    Postural Orthostatic Tachycardia Syndrome: Your symptoms are somewhat compatable with POTS although the diagnostic criteria are vague.   We talked briefly about the cause of, diagnosis, and symptom management of POTS. The following recommendations were emphasized:  -avoid dehydration. Often it requires high volumes of fluids, often with salt/electrolytes included, to stay hydrated. People with POTS are very sensitive to fluid shifts and dehydration. Oral rehydration is preferred, and routine use of IV fluids is not recommended.  -if tolerated, compression stocking can assist with fluid management and prevent pooling in the legs.  -slow position changes are recommended  -if there is a feeling of severe lightheadedness, like near to passing out, recommend lying on the floor on the back, with legs elevated up on a chair or up against the wall.  -the best long term management of POTS symptoms is gradual exercise conditioning. I recommend seated exercises such as bike to start, to avoid the risk of falling with lightheadedness. Exercise programs, either through supervised programs like cardiac rehab or through personal programs, should focus on gradually increasing exercise tolerance and conditioning.   -this is a link to specific exercise recommendations for POTS:   [email or web address]-powell.net/  -we discussed the typical spectrum of dysautonomia, including typical  populations, that this sometimes spontaneously improves with age (though a small percentage have persistent symptoms), that this has uncomfortable symptoms but is not associated with long term mortality, and that the etiology/treatment of this is an area of active research     ZIO XT-  Long Term Monitor Instructions  Your physician has requested you wear a ZIO patch monitor for 7 days.  This is a single patch monitor. [employer] supplies one patch monitor per enrollment. Additional stickers are not available. Please do not apply patch if you will be having a Nuclear Stress Test,  Echocardiogram, Cardiac CT, MRI, or Chest Xray during the period you would be wearing the  monitor. The patch cannot be worn during these tests. You cannot remove and re-apply the  ZIO XT patch monitor.  Your ZIO patch monitor will be mailed 3 day USPS to your address on file. It may take 3-5 days  to receive your monitor after you have been enrolled.  Once you have received your monitor, please review the enclosed instructions. Your monitor  has already been registered assigning a specific monitor serial # to you.  Billing and Patient Assistance Program Information  We have supplied [employer] with any of your insurance information on file for billing purposes. [employer] offers a sliding scale Patient Assistance Program for patients that do not have  insurance, or whose insurance does not completely cover the cost of the ZIO monitor.  You must apply for the Patient Assistance Program to qualify for this discounted rate.  To apply, please call [employer] at [phone], select option 4, select option 2, ask to apply for  Patient Assistance Program. Meredeth Ide will ask your household income, and how many people  are in your household. They will quote your out-of-pocket cost based on that information.  [employer] will also be able to set up a 63-month, interest-free payment plan if needed.  Applying the monitor   Shave hair from upper left chest.  Hold abrader disc by orange tab. Rub abrader in 40 strokes over the upper left chest as  indicated in your monitor instructions.  Clean area with 4 enclosed alcohol pads. Let dry.  Apply patch as indicated in monitor instructions. Patch will be placed under  collarbone on left  side of chest with arrow pointing upward.  Rub patch adhesive wings for 2 minutes. Remove white label marked "1". Remove the white  label marked "2". Rub patch adhesive wings for 2 additional minutes.  While looking in a mirror, press and release button in center of patch. A small green light will  flash 3-4 times. This will be your only indicator that the monitor has been turned on.  Do not shower for the first 24 hours. You may shower after the first 24 hours.  Press the button if you feel a symptom. You will hear a small click. Record Date, Time and  Symptom in the Patient Logbook.  When you are ready to remove the patch, follow instructions on the last 2 pages of Patient  Logbook. Stick patch monitor onto the last page of Patient Logbook.  Place Patient Logbook in the blue and white box. Use locking tab on box and tape box closed  securely. The blue and white box has prepaid postage on it. Please place it in the mailbox as  soon as possible. Your physician should have your test results approximately 7 days after the  monitor has been mailed back to [facility].  Call [facility] Customer Care at [phone]  if you have questions regarding  your ZIO XT patch monitor. Call them immediately if you see an orange light blinking on your  monitor.  If your monitor falls off in less than 4 days, contact our Monitor department at [phone].  If your monitor becomes loose or falls off after 4 days call [employer] at [phone] for  suggestions on securing your monitor

## 2023-03-05 NOTE — Progress Notes (Unsigned)
Enrolled for [employer] to mail a ZIO XT long term holter monitor to the patients address on file.  

## 2023-03-05 NOTE — Progress Notes (Signed)
Primary Care Provider: Rema Fendt, NP [facility] Cardiologist: Bryan Lemma, MD Electrophysiologist: None  Clinic Note: Chief Complaint  Patient presents with   New Patient (Initial Visit)    Rapid heartbeats associated with dizziness.    ===================================  ASSESSMENT/PLAN   Problem List Items Addressed This Visit       Cardiology Problems   POTS (postural orthostatic tachycardia syndrome) - Primary    We talked quite a bit about the pathology competence of what makes POTS diagnosis.  With that we also discussed adequate hydration, gradually increasing exercise starting with supine or sitting up.  Hydrate, support stockings and foot elevation Checking Zio patch monitor to assess for tachyarrhythmias.  See patient instructions for details on exercise options and gradually building up conditioning      Relevant Medications   propranolol (INDERAL) 10 MG tablet   Essential hypertension (Chronic)    Blood pressure looks good on low-dose propranolol.      Relevant Medications   propranolol (INDERAL) 10 MG tablet   Other Relevant Orders   EKG 12-Lead (Completed)   LONG TERM MONITOR (3-14 DAYS)     Other   Rapid heart rate (Chronic)    Fast heart rate spells, not sure whether this is just sinus rhythm atrial tachycardia etc.  It seems to be distribution possibly any particular activity or with change in position. We checked orthostatics today and they are not all that prominent.  Would be leery of titrating beta-blocker for further may just be better to switch to a different beta-blocker.  Plan: Will check 7-day Zio patch monitor to assess for arrhythmias. Recommend adequate hydration try to hydrate up to 80 ounces of water a day with electrolyte supplementation and eating. Recommend support stockings for edema but also to increase venous return She is currently taking propranolol 1 tablet a day, IVUS recommended she takes 1/2 tablet  twice daily for now, would probably be interested to change to a more potent beta-blocker such as metoprolol and follow-up. Minimize diuretic dosage      Relevant Orders   EKG 12-Lead (Completed)   LONG TERM MONITOR (3-14 DAYS)   Other Visit Diagnoses     Tachycardia       Relevant Medications   propranolol (INDERAL) 10 MG tablet   Other Relevant Orders   EKG 12-Lead (Completed)   LONG TERM MONITOR (3-14 DAYS)       ===================================  HPI:    [name] is a morbidly obese 27 y.o. female with history of Abnormal Uterine Bleeding, and Hypertension who is being seen today for the evaluation of Palpitations/Tachycardia And Hypertension at the request of Rema Fendt, NP.  [name] was seen on December 18, 2022 with [facility] [place], [place]) with hypertension.  Was started on propranolol 10 mg twice daily and noted feeling dizzy off and on.  She was then seen on January 04, 2023 by Ricky Stabs, NP.  She noted feeling dizzy and woozy being on the propranolol.  Otherwise no major symptoms.  Monitor referral to medical weight management. ->  Propranolol was reduced to 10 mg once daily from twice daily.  Recent Hospitalizations:  02/01/2023-ER visit for Strep Throat 02/25/2023-ER visit for hand pain.  Reviewed  CV studies:    The following studies were reviewed today: (if available, images/films reviewed: From Epic Chart or Care Everywhere) None:  Interval History:   [name] presents here today for evaluation of rapid heart rate  spells.  She was started on propranolol by her PCP and has not noticed any improvement in symptoms. She says that she knows that her heart rate goes up really fast after she stands up.  He goes up a lot faster than 1 would expect with routine activity and she is lightheaded and dizzy.  Has not had syncope though.  On occasion she will have some chest discomfort with it but now recently has not been  as bad.  Is mostly to the heart rate is fast and not necessarily irregular.  She has been trying to stay adequately hydrated is drinking about 64 ounces a day, but is not always eating routinely.  She does note some mild pedal edema but no PND orthopnea. No chest pain or pressure with rest or exertion, just occasional chest discomfort with she is very tachycardic. She has not had any syncope or near syncope but has had some dizziness.  No TIA or amaurosis fugax.  No claudication.  Asked about she is not been diagnosed with OSA but feels unrested when she wakes up.  But is not necessarily because of apnea, more because of moving around a lot.  REVIEWED OF SYSTEMS   Review of Systems  Constitutional:  Positive for malaise/fatigue. Negative for weight loss.  HENT:  Negative for congestion and sinus pain.   Respiratory:  Negative for cough and wheezing.   Cardiovascular:  Positive for leg swelling (Trivial foot and ankle).  Gastrointestinal:  Negative for blood in stool and melena.  Genitourinary:  Positive for frequency. Negative for hematuria.  Musculoskeletal:  Positive for back pain and joint pain.    I have reviewed and (if needed) personally updated the patient's problem list, medications, allergies, past medical and surgical history, social and family history.   PAST MEDICAL HISTORY   Past Medical History:  Diagnosis Date   Anxiety    Chlamydia 2018, 2021   Chronic kidney disease    kidney stones and kidney infection with last pregnancy   Depression    "gets in her moods, currently ok'   Gonorrhea 2018   Headache    Incisional hernia 04/2013   Infection    UTI   LGSIL on Pap smear of cervix 2019   Ovarian cyst    Urethral diverticulum   Menorrhagia  PAST SURGICAL HISTORY   Past Surgical History:  Procedure Laterality Date   CHOLECYSTECTOMY  04/03/2012   Procedure: LAPAROSCOPIC CHOLECYSTECTOMY WITH INTRAOPERATIVE CHOLANGIOGRAM;  Surgeon: Velora Heckler, MD;  Location: [facility]  ORS;  Service: General;  Laterality: N/A;   ERCP  03/12/2012   Procedure: ENDOSCOPIC RETROGRADE CHOLANGIOPANCREATOGRAPHY (ERCP);  Surgeon: Petra Kuba, MD;  Location: [facility] OR;  Service: Endoscopy;  Laterality: N/A;   INGUINAL HERNIA REPAIR N/A 05/15/2013   Procedure: HERNIA REPAIR INCISIONAL;  Surgeon: Velora Heckler, MD;  Location: [facility];  Service: General;  Laterality: N/A;   INSERTION OF MESH N/A 05/15/2013   Procedure: INSERTION OF MESH;  Surgeon: Velora Heckler, MD;  Location: [facility];  Service: General;  Laterality: N/A;   URETERAL STENT PLACEMENT     WISDOM TOOTH EXTRACTION      MEDICATIONS/ALLERGIES   Current Meds  Medication Sig   norethindrone (AYGESTIN) 5 MG tablet Take 3 tablets (15 mg total) by mouth daily.   []  propranolol (INDERAL) 10 MG tablet Take 1 tablet (10 mg total) by mouth 2 (two) times daily.  (Patient taking differently: Take 10 mg by mouth daily.)  Allergies  Allergen Reactions   Penicillins Anaphylaxis, Itching, Swelling, Rash and Other (See Comments)    Has patient had a PCN reaction causing immediate rash, facial/tongue/throat swelling, SOB or lightheadedness with hypotension: Yes Has patient had a PCN reaction causing severe rash involving mucus membranes or skin necrosis: No Has patient had a PCN reaction that required hospitalization: No Has patient had a PCN reaction occurring within the last 10 years: No If all of the above answers are "NO", then may proceed with Cephalosporin use.  And swelling   Clindamycin/Lincomycin Itching and Swelling    SOCIAL HISTORY/FAMILY HISTORY   Reviewed in Epic:   Social History   Tobacco Use   Smoking status: Never    Passive exposure: Never   Smokeless tobacco: Never  Vaping Use   Vaping Use: Never used  Substance Use Topics   Alcohol use: Not Currently    Comment: last drink 2 yrs ago   Drug use: No   Social History   Social History Narrative   Not on file    Family History  Problem Relation Age of Onset   Hypertension Mother    Hypertension Father    Diabetes Paternal Grandmother    Lung cancer Paternal Grandfather        smoked   Cancer Paternal Grandfather    Seizures Paternal Grandfather    Breast cancer Maternal Aunt   Vitals  OBJCTIVE -PE, EKG, labs   Wt Readings from Last 3 Encounters:  03/05/23 257 lb 6.4 oz (116.8 kg)  02/02/23 258 lb 3.2 oz (117.1 kg)  02/01/23 260 lb (117.9 kg)    Physical Exam: BP 118/82   Pulse 80   Ht 5\' 3"  (1.6 m)   Wt 257 lb 6.4 oz (116.8 kg)   SpO2 96%   BMI 45.60 kg/m  Physical Exam Vitals reviewed.  Constitutional:      General: She is not in acute distress.    Appearance: She is obese. She is not ill-appearing or toxic-appearing.     Comments: Well-groomed.  HENT:     Head: Normocephalic and atraumatic.  Neck:     Vascular: No carotid bruit.  Cardiovascular:     Rate and Rhythm: Normal rate and regular rhythm. No extrasystoles are present.    Chest Wall: PMI is not displaced (Unable to assess).     Pulses: Normal pulses.     Heart sounds: S1 normal and S2 normal. Heart sounds are distant. No murmur heard.    No friction rub. No gallop.  Pulmonary:     Effort: Pulmonary effort is normal. No respiratory distress.     Breath sounds: Normal breath sounds. No wheezing, rhonchi or rales.  Abdominal:     General: There is no distension.     Palpations: Abdomen is soft.     Tenderness: There is no abdominal tenderness.     Comments: Obese but otherwise soft/NT/ND since NABS.  No HSM. No abdominal  Musculoskeletal:        General: Swelling (Trivial) present. Normal range of motion.     Cervical back: Normal range of motion and neck supple.  Skin:    General: Skin is warm and dry.  Neurological:     General: No focal deficit present.     Mental Status: She is alert and oriented to person, place, and time.  Psychiatric:        Behavior: Behavior normal.        Thought Content:  Thought content normal.  Judgment: Judgment normal.      Adult ECG Report  Rate: 80 ;  Rhythm: normal sinus rhythm and normal axis, intervals durations. ;   Narrative Interpretation: Normal  Recent Labs: Reviewed Lab Results  Component Value Date   CHOL 120 01/04/2023   HDL 39 (L) 01/04/2023   LDLCALC 59 01/04/2023   TRIG 119 01/04/2023   CHOLHDL 3.1 01/04/2023   Lab Results  Component Value Date   CREATININE 0.83 12/18/2022   BUN 10 12/18/2022   NA 142 12/18/2022   K 4.1 12/18/2022   CL 103 12/18/2022   CO2 17 (L) 04/28/2022      Latest Ref Rng & Units 12/18/2022    1:50 PM 09/12/2021    4:22 PM 06/13/2021    2:02 PM  CBC  WBC 3.4 - 10.8 x10E3/uL 9.5  6.0  6.4   Hemoglobin 11.1 - 15.9 g/dL 16.1  09.6  04.5   Hematocrit 34.0 - 46.6 % 41.4  41.5  39.9   Platelets 150 - 450 x10E3/uL 297  245  285     Lab Results  Component Value Date   HGBA1C 5.2 01/04/2023   Lab Results  Component Value Date   TSH 2.110 12/18/2022    ================================================== I spent a total of 36  minutes with the patient spent in direct patient consultation.  Additional time spent with chart review  / charting (studies, outside notes, etc): 25 min Total Time: 61 min  Current medicines are reviewed at length with the patient today.  (+/- concerns) n/a  Notice: This dictation was prepared with Dragon dictation along with smart phrase technology. Any transcriptional errors that result from this process are unintentional and may not be corrected upon review.   Studies Ordered:  Orders Placed This Encounter  Procedures   LONG TERM MONITOR (3-14 DAYS)   EKG 12-Lead  3.  History of TIA Number is old Meds ordered this encounter  Medications   propranolol (INDERAL) 10 MG tablet    Sig: Take 0.5 tablets (5 mg total) by mouth 2 (two) times daily.    Dispense:  60 tablet    Refill:  1    Patient Instructions / Medication Changes & Studies & Tests Ordered    Patient Instructions  Medication Instructions:       Change an take Propranolol to 1/2 tablet of 10 mg twice a day instead taking 10 mg daily     *If you need a refill on your cardiac medications before your next appointment, please call your pharmacy*  Other Instructions  Hydrate  Hydrate  - increase intake to 80 oz a day  - mixed liquid like Gatorade , liquid IV -- low sugar for both.  Wear support socks ( traveling socks)    Lab Work: Not needed   Testing/Procedures: WiIl be mailed to you in 3 to 7 days Your physician has recommended that you wear a holter monitor 7 days Zio . Holter monitors are medical devices that record the heart's electrical activity. Doctors most often use these monitors to diagnose arrhythmias. Arrhythmias are problems with the speed or rhythm of the heartbeat. The monitor is a small, portable device. You can wear one while you do your normal daily activities. This is usually used to diagnose what is causing palpitations/syncope (passing out).    Follow-Up: At [facility], you and your health needs are our priority.  As part of our continuing mission to provide you with exceptional heart care, we have  created designated Provider Care Teams.  These Care Teams include your primary Cardiologist (physician) and Advanced Practice Providers (APPs -  Physician Assistants and Nurse Practitioners) who all work together to provide you with the care you need, when you need it.  We recommend signing up for the patient portal called "MyChart".  Sign up information is provided on this After Visit Summary.  MyChart is used to connect with patients for Virtual Visits (Telemedicine).  Patients are able to view lab/test results, encounter notes, upcoming appointments, etc.  Non-urgent messages can be sent to your provider as well.   To learn more about what you can do with MyChart, go to [email or web address].    Your next appointment:   6 to 8  week(s)  Provider:   Bryan Lemma, MD     Other Instructions  Hydrate  Hydrate  - increase intake to 80 oz a day  - mixed liquid like Gatorade , liquid IV -- low sugar for both.  Wear support socks ( traveling socks)    Postural Orthostatic Tachycardia Syndrome: Your symptoms are somewhat compatable with POTS although the diagnostic criteria are vague.   We talked briefly about the cause of, diagnosis, and symptom management of POTS. The following recommendations were emphasized:  -avoid dehydration. Often it requires high volumes of fluids, often with salt/electrolytes included, to stay hydrated. People with POTS are very sensitive to fluid shifts and dehydration. Oral rehydration is preferred, and routine use of IV fluids is not recommended.  -if tolerated, compression stocking can assist with fluid management and prevent pooling in the legs.  -slow position changes are recommended  -if there is a feeling of severe lightheadedness, like near to passing out, recommend lying on the floor on the back, with legs elevated up on a chair or up against the wall.  -the best long term management of POTS symptoms is gradual exercise conditioning. I recommend seated exercises such as bike to start, to avoid the risk of falling with lightheadedness. Exercise programs, either through supervised programs like cardiac rehab or through personal programs, should focus on gradually increasing exercise tolerance and conditioning.   -this is a link to specific exercise recommendations for POTS:   http://peterson-powell.net/  -we discussed the typical spectrum of dysautonomia, including typical populations, that this sometimes spontaneously improves with age (though a small percentage have persistent symptoms), that this has uncomfortable symptoms but is not associated with long term mortality, and that the etiology/treatment of this is  an area of active research     ZIO XT- Long Term Monitor Instructions  Your physician has requested you wear a ZIO patch monitor for 7 days.  This is a single patch monitor. [employer] supplies one patch monitor per enrollment. Additional stickers are not available. Please do not apply patch if you will be having a Nuclear Stress Test,  Echocardiogram, Cardiac CT, MRI, or Chest Xray during the period you would be wearing the  monitor. The patch cannot be worn during these tests. You cannot remove and re-apply the  ZIO XT patch monitor.  Your ZIO patch monitor will be mailed 3 day USPS to your address on file. It may take 3-5 days  to receive your monitor after you have been enrolled.  Once you have received your monitor, please review the enclosed instructions. Your monitor  has already been registered assigning a specific monitor serial # to you.  Billing and Patient Assistance Program Information  We have supplied [employer] with any of your insurance information  on file for billing purposes. [employer] offers a sliding scale Patient Assistance Program for patients that do not have  insurance, or whose insurance does not completely cover the cost of the ZIO monitor.  You must apply for the Patient Assistance Program to qualify for this discounted rate.  To apply, please call [employer] at [phone], select option 4, select option 2, ask to apply for  Patient Assistance Program. Meredeth Ide will ask your household income, and how many people  are in your household. They will quote your out-of-pocket cost based on that information.  [employer] will also be able to set up a 72-month, interest-free payment plan if needed.  Applying the monitor   Shave hair from upper left chest.  Hold abrader disc by orange tab. Rub abrader in 40 strokes over the upper left chest as  indicated in your monitor instructions.  Clean area with 4 enclosed alcohol pads. Let dry.  Apply patch as indicated in monitor  instructions. Patch will be placed under collarbone on left  side of chest with arrow pointing upward.  Rub patch adhesive wings for 2 minutes. Remove white label marked "1". Remove the white  label marked "2". Rub patch adhesive wings for 2 additional minutes.  While looking in a mirror, press and release button in center of patch. A small green light will  flash 3-4 times. This will be your only indicator that the monitor has been turned on.  Do not shower for the first 24 hours. You may shower after the first 24 hours.  Press the button if you feel a symptom. You will hear a small click. Record Date, Time and  Symptom in the Patient Logbook.  When you are ready to remove the patch, follow instructions on the last 2 pages of Patient  Logbook. Stick patch monitor onto the last page of Patient Logbook.  Place Patient Logbook in the blue and white box. Use locking tab on box and tape box closed  securely. The blue and white box has prepaid postage on it. Please place it in the mailbox as  soon as possible. Your physician should have your test results approximately 7 days after the  monitor has been mailed back to [facility].  Call [facility] Customer Care at [phone] if you have questions regarding  your ZIO XT patch monitor. Call them immediately if you see an orange light blinking on your  monitor.  If your monitor falls off in less than 4 days, contact our Monitor department at [phone].  If your monitor becomes loose or falls off after 4 days call [employer] at [phone] for  suggestions on securing your monitor      Marykay Lex, MD, MS Bryan Lemma, M.D., M.S. Interventional Cardiologist  [facility]  Pager # [phone] Phone # [phone] [address] [place], [place] [address]   Thank you for choosing [facility] [place]!!

## 2023-03-07 DIAGNOSIS — R000 Tachycardia, unspecified: Secondary | ICD-10-CM | POA: Diagnosis not present

## 2023-03-07 DIAGNOSIS — I10 Essential (primary) hypertension: Secondary | ICD-10-CM

## 2023-03-11 ENCOUNTER — Encounter: Payer: Self-pay | Admitting: Cardiology

## 2023-03-11 DIAGNOSIS — G90A Postural orthostatic tachycardia syndrome (POTS): Secondary | ICD-10-CM | POA: Insufficient documentation

## 2023-03-11 NOTE — Assessment & Plan Note (Signed)
Blood pressure looks good on low-dose propranolol.

## 2023-03-11 NOTE — Assessment & Plan Note (Signed)
We talked quite a bit about the pathology competence of what makes POTS diagnosis.  With that we also discussed adequate hydration, gradually increasing exercise starting with supine or sitting up.  Hydrate, support stockings and foot elevation Checking Zio patch monitor to assess for tachyarrhythmias.  See patient instructions for details on exercise options and gradually building up conditioning

## 2023-03-11 NOTE — Assessment & Plan Note (Addendum)
Fast heart rate spells, not sure whether this is just sinus rhythm atrial tachycardia etc.  It seems to be distribution possibly any particular activity or with change in position. We checked orthostatics today and they are not all that prominent.  Would be leery of titrating beta-blocker for further may just be better to switch to a different beta-blocker.  Plan: Will check 7-day Zio patch monitor to assess for arrhythmias. Recommend adequate hydration try to hydrate up to 80 ounces of water a day with electrolyte supplementation and eating. Recommend support stockings for edema but also to increase venous return She is currently taking propranolol 1 tablet a day, IVUS recommended she takes 1/2 tablet twice daily for now, would probably be interested to change to a more potent beta-blocker such as metoprolol and follow-up. Minimize diuretic dosage

## 2023-03-13 ENCOUNTER — Telehealth: Payer: Medicaid Other | Admitting: Physician Assistant

## 2023-03-13 DIAGNOSIS — K921 Melena: Secondary | ICD-10-CM

## 2023-03-13 DIAGNOSIS — K219 Gastro-esophageal reflux disease without esophagitis: Secondary | ICD-10-CM

## 2023-03-14 NOTE — Progress Notes (Signed)
Because of dark/black stools which raises concern for blood in the stool from possible ulcer, I feel your condition warrants further evaluation and I recommend that you be seen in a face to face visit. You need an examination and a hemoccult test to see if change in stool color is from blood in the stool, as if positive, this requires a further assessment.    NOTE: There will be NO CHARGE for this eVisit   If you are having a true medical emergency please call 911.      For an urgent face to face visit, [facility] [facility] has eight urgent care centers for your convenience:   NEW!! [facility] at [facility] Get Driving Directions [phone] [address], [address] [place], [address]    [facility] Center at [facility] Get Driving Directions [phone] [address] [place], [place] [address]   [facility] Urgent Care Center [facility]) Get Driving Directions [phone] [address] [place], [place] [address]  [facility]) Get Driving Directions [phone] [address] [place],  [place]  [address]  [facility] Urgent Care Center [facility] - at [employer]  [phone] [address] [place],  [place] [address]   [facility] at [facility] Get Driving Directions [phone] [address] [place], [place] [address]   [facility] Health Urgent Care at [facility] Get Driving Directions  [phone] [address] [place], [place] [address]   [facility] Health Urgent Care at [facility] Directions [phone] [address], [address] [place], [place] [address]  Your MyChart E-visit questionnaire answers were reviewed by a board certified advanced clinical practitioner to complete your personal care plan based on your specific symptoms.  Thank you for using  e-Visits.

## 2023-03-23 ENCOUNTER — Ambulatory Visit: Payer: Medicaid Other | Admitting: Orthopaedic Surgery

## 2023-03-27 ENCOUNTER — Encounter: Payer: Self-pay | Admitting: Cardiology

## 2023-03-27 NOTE — Telephone Encounter (Signed)
Monitor was essentially normal.  No arrhythmias noted.  Minimal ectopy.  Heart rates ranged from 46 about 136 bpm with an average of 76 bpm.  This does not meet criteria for inappropriate sinus tachycardia.  It is hard to tell what was driving her symptoms, because she had symptoms with normal sinus rhythm rates from the 76 bpm range to 126 bpm range.  More frequently they are in the low 100 range.  I do not think I can make any medication or treatment recommendations simply based on this result.  I do not know what she means by feeling crappy.  She can put hydrocortisone cream on the spot where her Zio patch was and take some Benadryl for couple days.  Like I tried explained to her when I saw the first day, for most of these conditions, but mainstay of treatment is doing the exercises and hydrating, wearing when stress stockings.  There is no magic bullet to treat this.  I would not treat blood pressure of 140/111 if it is an isolated reading.  In the case where pressure-like that is sustained after 2 readings 1 hour apart, she could take a 1/2  propranolol tab to slow her heart rate down and bring the blood pressure now.   Bryan Lemma, MD

## 2023-03-29 ENCOUNTER — Telehealth: Payer: No Typology Code available for payment source | Admitting: Physician Assistant

## 2023-03-29 DIAGNOSIS — R42 Dizziness and giddiness: Secondary | ICD-10-CM

## 2023-03-29 NOTE — Progress Notes (Signed)
Because of dizziness and lightheadedness along with this pain wrapping around your abdomen, and prior history, I feel your condition warrants further evaluation and I recommend that you be seen in a face to face visit.   NOTE: There will be NO CHARGE for this eVisit   If you are having a true medical emergency please call 911.      For an urgent face to face visit, [facility] has eight urgent care centers for your convenience:   NEW!! [facility] Health [facility]gent Care Center at [facility] Get Driving Directions [phone] [address], [address] [place], [address]    [facility] Health Urgent Care Center at [facility] Get Driving Directions [phone] [address] [place], [place] [address]   [facility] Health Urgent Care Center [facility]) Get Driving Directions [phone] [address] [place], [place] [address]  [facility] Health Urgent Care Center [facility]) Get Driving Directions [phone] [address] [place],  [place]  [address]  [facility] Health Urgent Care Center [facility] at [employer]  [phone] [address] [place],  [place] [address]   [facility] Health Urgent Care at [facility] Get Driving Directions [phone] [address] [place], [place] [address]   [facility] Care at [facility] Get Driving Directions  [phone] [address] [place], [place] [address]   [facility] Health Urgent Care at [facility] Directions [phone] [address] [place], [place] [address]  Your MyChart E-visit questionnaire answers were reviewed by a board certified advanced clinical practitioner to complete your personal care plan based on your specific symptoms.  Thank you for using e-Visits.

## 2023-04-03 ENCOUNTER — Telehealth: Payer: Self-pay | Admitting: Family

## 2023-04-03 NOTE — Telephone Encounter (Signed)
Called Pt to schedule, could not leave a vm.

## 2023-04-05 ENCOUNTER — Other Ambulatory Visit: Payer: Self-pay

## 2023-04-05 ENCOUNTER — Encounter: Payer: Self-pay | Admitting: Obstetrics and Gynecology

## 2023-04-06 ENCOUNTER — Ambulatory Visit (INDEPENDENT_AMBULATORY_CARE_PROVIDER_SITE_OTHER): Payer: No Typology Code available for payment source | Admitting: Obstetrics and Gynecology

## 2023-04-06 ENCOUNTER — Other Ambulatory Visit (HOSPITAL_COMMUNITY)
Admission: RE | Admit: 2023-04-06 | Discharge: 2023-04-06 | Disposition: A | Discharge status: Home / Self Care | Payer: No Typology Code available for payment source | Source: Ambulatory Visit | Attending: Obstetrics and Gynecology | Admitting: Obstetrics and Gynecology

## 2023-04-06 VITALS — BP 126/81 | HR 102 | Wt 261.0 lb

## 2023-04-06 DIAGNOSIS — R87610 Atypical squamous cells of undetermined significance on cytologic smear of cervix (ASC-US): Secondary | ICD-10-CM | POA: Diagnosis not present

## 2023-04-06 DIAGNOSIS — N939 Abnormal uterine and vaginal bleeding, unspecified: Secondary | ICD-10-CM

## 2023-04-06 DIAGNOSIS — R87810 Cervical high risk human papillomavirus (HPV) DNA test positive: Secondary | ICD-10-CM

## 2023-04-06 NOTE — Progress Notes (Signed)
    GYNECOLOGY OFFICE COLPOSCOPY & ENDOMETRIAL BIOPSY PROCEDURE NOTE  27 y.o. [id number] here for colposcopy for ASCUS with POSITIVE high risk HPV pap smear on 06/13/21 as well as endometrial biopsy for AUB non-responsive to hormone therapy & BMI 46. The indications for colposcopy & endometrial biopsy were reviewed.   Risks of the biopsy including cramping, bleeding, infection, uterine perforation, inadequate specimen and need for additional procedures were discussed. Consent was signed. Time out was performed.   Pregnancy test:  negative  Informed consent and review of risks, benefit and alternatives performed. Written consent given.   Speculum inserted into patient's vagina assuring full view of cervix and vaginal walls. 3 swabs of vinegar solution applied to the cervix and vaginal walls and colposcope was used to observe both the cervix and vaginal walls.   Colposcopy adequate? No Challenging exam due to known vaginal cysts that prolapse into view of cervix. If LEEP needed, would strongly consider completing in OR. Thick/grey AWE at 5 o'clock. Thick AWE at 3 o'clock. Corresponding biopsies obtained. Random biopsy at 8 o'clock obtained due to difficult visualization. SCJ incompletely visualized. ECC collected.  Attention turned to EMB. Cervix prepped with betadine. The 3 mm pipelle was easily introduced into the endometrial cavity without difficulty to a depth of 9 cm, and a Moderate amount of tissue was obtained after two passes and sent to pathology.   All specimens were labeled and sent to pathology.  Monsel's applied to biopsy sites for good hemostasis and speculum removed.  Pt tolerated well with minimal pain and bleeding.   Patient was given post procedure instructions.  Will follow up pathology and manage accordingly; patient will be contacted with results and recommendations.  Routine preventative health maintenance measures emphasized.  Harvie Bridge, MD Obstetrician &  Gynecologist, [facility] for [employer], [facility]

## 2023-04-06 NOTE — Patient Instructions (Signed)
It is normal to have cramping and bleeding for the next week. You should feel better every day.   Please call us if you have any severe pain, bleeding that soaks more than 1 pad in a hour, have fevers, or feel like you're going to pass out.   You can take tylenol 1000mg  every 8 hours and ibuprofen 800mg  every 8 hours as needed for pain. It is ok to take both at the same time.

## 2023-04-09 ENCOUNTER — Encounter: Payer: Self-pay | Admitting: Obstetrics and Gynecology

## 2023-04-09 ENCOUNTER — Telehealth: Payer: Self-pay

## 2023-04-09 LAB — SURGICAL PATHOLOGY

## 2023-04-09 NOTE — Telephone Encounter (Signed)
Copied from CRM [id number]. Topic: General - Other >> Apr 05, 2023 12:07 PM [place] B wrote: Reason for CRM: Amy from [facility], checking status of form sent in, says sent in baseline form on May 22, has this been received? 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

## 2023-04-11 ENCOUNTER — Encounter: Payer: Self-pay | Admitting: Obstetrics and Gynecology

## 2023-04-12 NOTE — Telephone Encounter (Signed)
This has been sent to [facility]

## 2023-04-13 ENCOUNTER — Other Ambulatory Visit: Payer: Self-pay | Admitting: Physician Assistant

## 2023-04-13 DIAGNOSIS — R000 Tachycardia, unspecified: Secondary | ICD-10-CM

## 2023-04-13 NOTE — Telephone Encounter (Signed)
Requested medication (s) are due for refill today:  No  Requested medication (s) are on the active medication list:   Yes  Future visit scheduled:   Yes with Primary Care at [facility]   Last ordered: 03/05/2023 #60, 1 refill   Returned because this is prescribed by Dr. Bryan Lemma, cardiologist with [facility] at [facility].       Requested Prescriptions  Pending Prescriptions Disp Refills   propranolol (INDERAL) 10 MG tablet [Pharmacy Med Name: PROPRANOLOL 10MG  TABLETS] 60 tablet 1    Sig: TAKE 1 TABLET(10 MG) BY MOUTH TWICE DAILY     There is no refill protocol information for this order

## 2023-04-19 ENCOUNTER — Telehealth: Payer: No Typology Code available for payment source | Admitting: Physician Assistant

## 2023-04-19 DIAGNOSIS — G8929 Other chronic pain: Secondary | ICD-10-CM

## 2023-04-19 NOTE — Progress Notes (Signed)
Because this has been present longer than 4 weeks and you mention a "grinding" pain and have not had any recent imaging (last seen was 01/18/22), I feel your condition warrants further evaluation and I recommend that you be seen for a face to face visit.  Please contact your primary care physician practice to be seen. Many offices offer virtual options to be seen via video if you are not comfortable going in person to a medical facility at this time.  NOTE: You will NOT be charged for this eVisit.  If you do not have a PCP, [facility] offers a free physician referral service available at [phone]. Our trained staff has the experience, knowledge and resources to put you in touch with a physician who is right for you.   NOTE: There will be NO CHARGE for this eVisit   If you are having a true medical emergency please call 911.      For an urgent face to face visit, [facility] has eight urgent care centers for your convenience:   NEW!! [facility] Health Urgent Care Center at [facility] Get Driving Directions [phone] [address] [place], [address]    [facility] Health Urgent Care Center at [facility] Get Driving Directions [phone] [address] [place], [place] [address]   [facility] Health Urgent Care Center [employer]) Get Driving Directions [phone] [address] [place], [place] [address]  [facility] Health Urgent Care Center [facility]) Get Driving Directions [phone] [address] [place],  [place]  [address]  [facility] Health Urgent Care Center [facility] - at [employer]  [phone] [place],  [place] [address]   [facility] Health Urgent Care at [facility] Get Driving Directions [phone] [address] [place], [place] [address]   [facility] Health Urgent Care at [facility] Get Driving Directions  [phone] [address] [place], [place] [address]   [facility] Health Urgent Care at [facility] Directions [phone] [address], [address] [place], [place] [address]  Your MyChart E-visit questionnaire answers were reviewed by a board certified advanced clinical practitioner to complete your personal care plan based on your specific symptoms.  Thank you for using e-Visits.   I have spent 5 minutes in review of e-visit questionnaire, review and updating patient chart, medical decision making and response to patient.   Margaretann Loveless, PA-C

## 2023-04-25 NOTE — Progress Notes (Signed)
Virtual Visit via Video Note  I connected with [name], on 04/27/2023 at 10:12 AM by video and verified that I am speaking with the correct person using two identifiers.  Consent: I discussed the limitations, risks, security and privacy concerns of performing an evaluation and management service by video and the availability of in person appointments. I also discussed with the patient that there may be a patient responsible charge related to this service. The patient expressed understanding and agreed to proceed.   Location of Patient: Home  Location of Provider: [facility]   Persons participating in Telemedicine visit: [place] Dawn Scarlette Calico, NP Curley Spice, CMA  History of Present Illness: [name] is a 27 y.o. female who presents for back pain.   Her concerns today include:  - Lower back pain persisting. Denies recent trauma/injury and red flag symptoms. She was seen by Orthopedics on last year and has not followed-up since then. She is ready for referral back to the same. Also, agreeable to referral to Physical Therapy which was recommended during last visit at Orthopedics. She declines medication to assist with symptoms. - Anxiety depression persisting. Reports depression is worse than anxiety. States she is unsure of primary cause of anxiety depression. States "comes in waves". Reports she tried Zoloft and Prozac in the past and ineffective. She would like to trial a new anxiety depression medication. She was established with Psychiatry in 2014. She would like referral back to the same. She denies thoughts of self-harm, suicidal ideations, homicidal ideations. - Migraines. She denies associated red flag symptoms. Taking over-the-counter medications with minimal relief. Reports in the past Sumatriptan made migraines worse. She would like to trial a new medication. - Heartburn daily. Taking over-the-counter Tums with  minimal relief.  - No further issues/concerns for discussion today.   Past Medical History:  Diagnosis Date   Anxiety    Chlamydia 2018, 2021   Chronic kidney disease    kidney stones and kidney infection with last pregnancy   Depression    "gets in her moods, currently ok'   Gonorrhea 2018   Headache    Incisional hernia 04/2013   Infection    UTI   LGSIL on Pap smear of cervix 2019   Ovarian cyst    Urethral diverticulum    Allergies  Allergen Reactions   Penicillins Anaphylaxis, Itching, Swelling, Rash and Other (See Comments)    Has patient had a PCN reaction causing immediate rash, facial/tongue/throat swelling, SOB or lightheadedness with hypotension: Yes Has patient had a PCN reaction causing severe rash involving mucus membranes or skin necrosis: No Has patient had a PCN reaction that required hospitalization: No Has patient had a PCN reaction occurring within the last 10 years: No If all of the above answers are "NO", then may proceed with Cephalosporin use.  And swelling   Clindamycin/Lincomycin Itching and Swelling    Current Outpatient Medications on File Prior to Visit  Medication Sig Dispense Refill   norethindrone (AYGESTIN) 5 MG tablet Take 3 tablets (15 mg total) by mouth daily. 90 tablet 11   propranolol (INDERAL) 10 MG tablet Take 0.5 tablets (5 mg total) by mouth 2 (two) times daily. 60 tablet 1   No current facility-administered medications on file prior to visit.    Observations/Objective: Alert and oriented x 3. Not in acute distress. Physical examination not completed as this is a telemedicine visit.  Assessment and Plan: 1. Chronic low back pain, unspecified  back pain laterality, unspecified whether sciatica present - Patient declined pharmacological therapy. - Referral to Orthopedics for further evaluation/management.  - Referral to Physical Therapy for further evaluation/management.  - During the interim follow-up with primary provider as  scheduled.  - Ambulatory referral to Orthopedics - Ambulatory referral to Physical Therapy  2. Anxiety and depression - Patient denies thoughts of self-harm, suicidal ideations, homicidal ideations. - Escitalopram and Hydroxyzine as prescribed. Counseled on medication adherence/adverse effects.  - Sertraline and Fluoxetine ineffective in the past.  - Referral to Psychiatry for further evaluation/management. During the interim follow-up with primary provider in 4 weeks or sooner if needed.  - escitalopram (LEXAPRO) 10 MG tablet; Take 1 tablet (10 mg total) by mouth at bedtime.  Dispense: 30 tablet; Refill: 1 - hydrOXYzine (VISTARIL) 25 MG capsule; Take 1 capsule (25 mg total) by mouth every 8 (eight) hours as needed.  Dispense: 30 capsule; Refill: 1 - Ambulatory referral to Psychiatry  3. Migraine without aura and without status migrainosus, not intractable - Topiramate as prescribed. Counseled on medication adherence/adverse effects.  - Patient declined referral to Neurology.  - Follow-up with primary provider in 4 weeks or sooner if needed.  - topiramate (TOPAMAX) 25 MG tablet; Take 1 tablet (25 mg total) by mouth daily.  Dispense: 30 tablet; Refill: 1  4. Gastroesophageal reflux disease, unspecified whether esophagitis present - Omperazole as prescribed. Counseled on medication adherence/adverse effects. - Follow-up with primary provider in 4 weeks or sooner if needed.  - omeprazole (PRILOSEC) 20 MG capsule; Take 1 capsule (20 mg total) by mouth daily.  Dispense: 90 capsule; Refill: 0   Follow Up Instructions: Follow-up with primary provider in 4 weeks or sooner if needed.    Patient was given clear instructions to go to Emergency Department or return to medical center if symptoms don't improve, worsen, or new problems develop.The patient verbalized understanding.  I discussed the assessment and treatment plan with the patient. The patient was provided an opportunity to ask questions  and all were answered. The patient agreed with the plan and demonstrated an understanding of the instructions.   The patient was advised to call back or seek an in-person evaluation if the symptoms worsen or if the condition fails to improve as anticipated.     I provided 10 minutes total of non-face-to-face time during this encounter.   Rema Fendt, NP  [facility] Primary Care at [facility] [place], [place] [phone] 04/27/2023, 11:01 AM

## 2023-04-27 ENCOUNTER — Telehealth (INDEPENDENT_AMBULATORY_CARE_PROVIDER_SITE_OTHER): Payer: Medicaid Other | Admitting: Family

## 2023-04-27 DIAGNOSIS — G43009 Migraine without aura, not intractable, without status migrainosus: Secondary | ICD-10-CM

## 2023-04-27 DIAGNOSIS — F32A Depression, unspecified: Secondary | ICD-10-CM

## 2023-04-27 DIAGNOSIS — M545 Low back pain, unspecified: Secondary | ICD-10-CM | POA: Diagnosis not present

## 2023-04-27 DIAGNOSIS — F419 Anxiety disorder, unspecified: Secondary | ICD-10-CM | POA: Diagnosis not present

## 2023-04-27 DIAGNOSIS — G8929 Other chronic pain: Secondary | ICD-10-CM

## 2023-04-27 DIAGNOSIS — K219 Gastro-esophageal reflux disease without esophagitis: Secondary | ICD-10-CM

## 2023-04-27 MED ORDER — TOPIRAMATE 25 MG PO TABS
25.0000 mg | ORAL_TABLET | Freq: Every day | ORAL | 1 refills | Status: DC
Start: 2023-04-27 — End: 2023-08-24

## 2023-04-27 MED ORDER — ESCITALOPRAM OXALATE 10 MG PO TABS
10.0000 mg | ORAL_TABLET | Freq: Every day | ORAL | 1 refills | Status: DC
Start: 2023-04-27 — End: 2023-08-24

## 2023-04-27 MED ORDER — HYDROXYZINE PAMOATE 25 MG PO CAPS
25.0000 mg | ORAL_CAPSULE | Freq: Three times a day (TID) | ORAL | 1 refills | Status: DC | PRN
Start: 2023-04-27 — End: 2023-08-24

## 2023-04-27 MED ORDER — OMEPRAZOLE 20 MG PO CPDR
20.0000 mg | DELAYED_RELEASE_CAPSULE | Freq: Every day | ORAL | 0 refills | Status: DC
Start: 2023-04-27 — End: 2023-10-15

## 2023-05-02 ENCOUNTER — Ambulatory Visit: Payer: No Typology Code available for payment source | Attending: Cardiology | Admitting: Cardiology

## 2023-05-02 ENCOUNTER — Encounter: Payer: Self-pay | Admitting: Cardiology

## 2023-05-14 ENCOUNTER — Encounter: Payer: Self-pay | Admitting: Obstetrics and Gynecology

## 2023-05-21 ENCOUNTER — Ambulatory Visit: Payer: No Typology Code available for payment source | Admitting: Obstetrics and Gynecology

## 2023-05-22 ENCOUNTER — Encounter (INDEPENDENT_AMBULATORY_CARE_PROVIDER_SITE_OTHER): Payer: Medicaid Other | Admitting: Physician Assistant

## 2023-05-25 ENCOUNTER — Telehealth: Payer: Self-pay | Admitting: Family

## 2023-05-25 NOTE — Telephone Encounter (Signed)
I will keep an eye out for these papers

## 2023-05-25 NOTE — Telephone Encounter (Signed)
Amy RN care manager with [facility] has sent a form for patient to be enrolled in the Weight Watcher program but needs her labs and doctors or nurses signature .  Francis Dowse is going to resend the form today.  She feels that this will benefit the patient.and the patient has agreed .  CB#  [phone] FAX#  [phone]

## 2023-05-28 ENCOUNTER — Telehealth (INDEPENDENT_AMBULATORY_CARE_PROVIDER_SITE_OTHER): Payer: 59 | Admitting: Obstetrics and Gynecology

## 2023-05-28 ENCOUNTER — Encounter: Payer: Self-pay | Admitting: Obstetrics and Gynecology

## 2023-05-28 DIAGNOSIS — N939 Abnormal uterine and vaginal bleeding, unspecified: Secondary | ICD-10-CM | POA: Diagnosis not present

## 2023-05-28 MED ORDER — IBUPROFEN 600 MG PO TABS: 600.0000 mg | ORAL_TABLET | Freq: Four times a day (QID) | ORAL | 1 refills | Status: DC | PRN

## 2023-05-28 NOTE — Progress Notes (Unsigned)
 {  new or return:28586} GYNECOLOGY VISIT  Subjective:  [place] Lakynn Brietzke is a 27 y.o. [phone] with LMP *** presenting for ***  Lasted 7 days - first 3 days were horrible. Day 4 stopped, then came back for 3 days. Pain & bleeding. Both are bothersome. Flex disc would leak within 30-40minutes    Reports stabbing lower abdominal pain x 3-4 days, taking tylenol 1000mg  twice or three times daily and pamprin without relief. No fevers/chills. Reports fluctuating appetite. +nausea, no vomiting. No vaginal discharge, constipation, or dysuria.   Menstrual Hx:  PMH: PSH: Meds: All: OB: Pap Hx:  Soc: FHx:  Objective:  There were no vitals filed for this visit.  General:  Alert, oriented and cooperative. Patient is in no acute distress.  Skin: Skin is warm and dry. No rash noted.   Cardiovascular: Normal heart rate noted  Respiratory: Normal respiratory effort, no problems with respiration noted  Abdomen: Soft, non-tender, non-distended   Pelvic: NEFG.   Exam performed in the presence of a chaperone  Assessment and Plan:  [place] Kammie Gearheart is a 27 y.o. with ***  There are no diagnoses linked to this encounter.  No follow-ups on file.  Future Appointments  Date Time Provider Department Center  01/07/2024 10:00 AM Rema Fendt, NP [facility] None    Lennart Pall, MD

## 2023-05-28 NOTE — Progress Notes (Unsigned)
TC to patient to begin video visit. Wants to discuss IUD

## 2023-06-11 ENCOUNTER — Encounter: Payer: Self-pay | Admitting: Obstetrics and Gynecology

## 2023-06-23 ENCOUNTER — Telehealth: Payer: 59 | Admitting: Nurse Practitioner

## 2023-06-23 DIAGNOSIS — B3731 Acute candidiasis of vulva and vagina: Secondary | ICD-10-CM | POA: Diagnosis not present

## 2023-06-23 MED ORDER — FLUCONAZOLE 150 MG PO TABS
150.0000 mg | ORAL_TABLET | Freq: Every day | ORAL | 0 refills | Status: DC
Start: 2023-06-23 — End: 2023-08-04

## 2023-06-23 NOTE — Progress Notes (Signed)

## 2023-06-23 NOTE — Progress Notes (Signed)
I have spent 5 minutes in review of e-visit questionnaire, review and updating patient chart, medical decision making and response to patient.  ° °Zelda W Fleming, NP ° °  °

## 2023-06-26 ENCOUNTER — Ambulatory Visit: Payer: 59 | Admitting: Obstetrics and Gynecology

## 2023-07-12 MED ORDER — JUNEL FE 24 1-20 MG-MCG(24) PO TABS: 1.0000 | ORAL_TABLET | Freq: Every day | ORAL | 0 refills | Status: DC

## 2023-07-24 ENCOUNTER — Emergency Department (HOSPITAL_BASED_OUTPATIENT_CLINIC_OR_DEPARTMENT_OTHER)
Admission: EM | Admit: 2023-07-24 | Discharge: 2023-07-24 | Disposition: A | Discharge status: Home / Self Care | Payer: 59 | Attending: Emergency Medicine | Admitting: Emergency Medicine

## 2023-07-24 ENCOUNTER — Encounter (HOSPITAL_BASED_OUTPATIENT_CLINIC_OR_DEPARTMENT_OTHER): Payer: Self-pay | Admitting: Emergency Medicine

## 2023-07-24 DIAGNOSIS — B9689 Other specified bacterial agents as the cause of diseases classified elsewhere: Secondary | ICD-10-CM

## 2023-07-24 DIAGNOSIS — M5442 Lumbago with sciatica, left side: Secondary | ICD-10-CM | POA: Insufficient documentation

## 2023-07-24 DIAGNOSIS — Z113 Encounter for screening for infections with a predominantly sexual mode of transmission: Secondary | ICD-10-CM | POA: Insufficient documentation

## 2023-07-24 DIAGNOSIS — N760 Acute vaginitis: Secondary | ICD-10-CM | POA: Diagnosis not present

## 2023-07-24 DIAGNOSIS — M545 Low back pain, unspecified: Secondary | ICD-10-CM | POA: Diagnosis present

## 2023-07-24 LAB — URINALYSIS, ROUTINE W REFLEX MICROSCOPIC
Bacteria, UA: NONE SEEN
Bilirubin Urine: NEGATIVE
Glucose, UA: NEGATIVE mg/dL
Hgb urine dipstick: NEGATIVE
Ketones, ur: NEGATIVE mg/dL
Nitrite: NEGATIVE
Specific Gravity, Urine: 1.024 (ref 1.005–1.030)
pH: 6.5 (ref 5.0–8.0)

## 2023-07-24 LAB — COMPREHENSIVE METABOLIC PANEL
ALT: 24 U/L (ref 0–44)
AST: 20 U/L (ref 15–41)
Albumin: 4.5 g/dL (ref 3.5–5.0)
Alkaline Phosphatase: 71 U/L (ref 38–126)
Anion gap: 9 (ref 5–15)
BUN: 12 mg/dL (ref 6–20)
CO2: 27 mmol/L (ref 22–32)
Calcium: 9.4 mg/dL (ref 8.9–10.3)
Chloride: 103 mmol/L (ref 98–111)
Creatinine, Ser: 0.75 mg/dL (ref 0.44–1.00)
GFR, Estimated: >60 mL/min (ref >60)
Glucose, Bld: 81 mg/dL (ref 70–99)
Potassium: 3.6 mmol/L (ref 3.5–5.1)
Sodium: 139 mmol/L (ref 135–145)
Total Bilirubin: 0.4 mg/dL (ref 0.3–1.2)
Total Protein: 7.4 g/dL (ref 6.5–8.1)

## 2023-07-24 LAB — CBC
HCT: 41 % (ref 36.0–46.0)
Hemoglobin: 14.4 g/dL (ref 12.0–15.0)
MCH: 32.6 pg (ref 26.0–34.0)
MCHC: 35.1 g/dL (ref 30.0–36.0)
MCV: 92.8 fL (ref 80.0–100.0)
Platelets: 276 10*3/uL (ref 150–400)
RBC: 4.42 MIL/uL (ref 3.87–5.11)
RDW: 12.5 % (ref 11.5–15.5)
WBC: 9.5 10*3/uL (ref 4.0–10.5)
nRBC: 0 % (ref 0.0–0.2)

## 2023-07-24 LAB — PREGNANCY, URINE: Preg Test, Ur: NEGATIVE

## 2023-07-24 LAB — WET PREP, GENITAL
Sperm: NONE SEEN
Trich, Wet Prep: NONE SEEN
WBC, Wet Prep HPF POC: <10 (ref <10)
Yeast Wet Prep HPF POC: NONE SEEN

## 2023-07-24 LAB — LIPASE, BLOOD: Lipase: 13 U/L (ref 11–51)

## 2023-07-24 MED ORDER — METRONIDAZOLE 500 MG PO TABS: 500.0000 mg | ORAL_TABLET | Freq: Two times a day (BID) | ORAL | 0 refills | Status: DC

## 2023-07-24 NOTE — ED Triage Notes (Signed)
Lower Back pain started about 1.5 years ago, but getting worse.    Also reports lower left side abdo pain that goes into left leg x 3 days, intermittent  Asking for std testing.

## 2023-07-24 NOTE — Discharge Instructions (Addendum)
Please follow-up with your primary care doctor, I am suspicious that you have sciatica, please take ibuprofen, Tylenol, and the Flexeril for your pain control.  I recommend that you likely get physical therapy outpatient.  Return to the ER if you have any loss of bowel, bladder, numbness of your groin, that is new.  Additionally your gonorrhea/chlamydia results will not come back, for the next few days, you will be called if it is positive and you need antibiotic treatment.

## 2023-07-24 NOTE — ED Provider Notes (Signed)
[place] [facility] DEPARTMENT AT [facility] Provider Note   CSN: [id number] Arrival date & time: 07/24/23  1950     History  Chief Complaint  Patient presents with   Back Pain   Abdominal Pain    [name] is a 27 y.o. female, history of ovarian cyst, who presents to the ED secondary to left lower back pain, radiating down her left leg, that is been going on for the last few weeks.  She states this is happened for the past year and a half, but that today it hurt more, so she decided come to the ER.  States that sharp and shooting, and radiates down her buttocks.  Worse when standing, moving.  Denies any loss of bowel, bladder, groin numbness.  Denies any trauma or abdominal pain.  Additionally states she would like to get tested for STDs.  Denies any kind of vaginal discharge, urinary symptoms.  Is not having any symptoms    Home Medications Prior to Admission medications   Medication Sig Start Date End Date Taking? Authorizing Provider  escitalopram (LEXAPRO) 10 MG tablet Take 1 tablet (10 mg total) by mouth at bedtime. 04/27/23   Rema Fendt, NP  fluconazole (DIFLUCAN) 150 MG tablet Take 1 tablet (150 mg total) by mouth daily. 06/23/23   Claiborne Rigg, NP  hydrOXYzine (VISTARIL) 25 MG capsule Take 1 capsule (25 mg total) by mouth every 8 (eight) hours as needed. 04/27/23   Rema Fendt, NP  ibuprofen (ADVIL) 600 MG tablet Take 1 tablet (600 mg total) by mouth every 6 (six) hours as needed. Take every 6-8 hours starting on the first day of your period 05/28/23   Lennart Pall, MD  norethindrone (AYGESTIN) 5 MG tablet Take 3 tablets (15 mg total) by mouth daily. 02/26/23   Lennart Pall, MD  Norethindrone Acetate-Ethinyl Estrad-FE (JUNEL FE 24) 1-20 MG-MCG(24) tablet Take 1 tablet by mouth daily. 07/12/23   Lennart Pall, MD  omeprazole (PRILOSEC) 20 MG capsule Take 1 capsule (20 mg total) by mouth daily. 04/27/23 07/26/23  Rema Fendt, NP   propranolol (INDERAL) 10 MG tablet Take 0.5 tablets (5 mg total) by mouth 2 (two) times daily. Patient not taking: Reported on 05/28/2023 03/05/23   Marykay Lex, MD  topiramate (TOPAMAX) 25 MG tablet Take 1 tablet (25 mg total) by mouth daily. 04/27/23   Rema Fendt, NP      Allergies    Penicillins and Clindamycin/lincomycin    Review of Systems   Review of Systems  Gastrointestinal:  Negative for abdominal pain.  Musculoskeletal:  Positive for back pain.    Physical Exam Updated Vital Signs BP (!) 129/91 (BP Location: Left Arm)   Pulse 95   Temp 98.1 F (36.7 C)   Resp 20   SpO2 94%  Physical Exam Vitals and nursing note reviewed.  Constitutional:      General: She is not in acute distress.    Appearance: She is well-developed.  HENT:     Head: Normocephalic and atraumatic.  Eyes:     Conjunctiva/sclera: Conjunctivae normal.  Cardiovascular:     Rate and Rhythm: Normal rate and regular rhythm.     Heart sounds: No murmur heard. Pulmonary:     Effort: Pulmonary effort is normal. No respiratory distress.     Breath sounds: Normal breath sounds.  Abdominal:     Palpations: Abdomen is soft.     Tenderness: There is no abdominal  tenderness.  Musculoskeletal:        General: No swelling.     Cervical back: Neck supple.     Comments: Tenderness to palpation of left paraspinal muscles, as well as along buttocks, with positive straight leg raise.  5 out of 5 strength bilateral lower extremities.  No deficits noted.  Skin:    General: Skin is warm and dry.     Capillary Refill: Capillary refill takes less than 2 seconds.  Neurological:     Mental Status: She is alert.  Psychiatric:        Mood and Affect: Mood normal.     ED Results / Procedures / Treatments   Labs (all labs ordered are listed, but only abnormal results are displayed) Labs Reviewed  URINALYSIS, ROUTINE W REFLEX MICROSCOPIC - Abnormal; Notable for the following components:      Result Value    APPearance HAZY (*)    Protein, ur TRACE (*)    Leukocytes,Ua [name] (*)    All other components within normal limits  WET PREP, GENITAL  LIPASE, BLOOD  COMPREHENSIVE METABOLIC PANEL  CBC  PREGNANCY, URINE  GC/CHLAMYDIA PROBE AMP ([place]) NOT AT [facility]    EKG None  Radiology No results found.  Procedures Procedures    Medications Ordered in ED Medications - No data to display  ED Course/ Medical Decision Making/ A&P                                 Medical Decision Making Patient is a 27 year old female, here for left back pain, it has been going on for the last few weeks, worse today.  She has positive straight leg raise, no urinary symptoms.  Blood work, urinalysis obtained by triage prior to me evaluating her.  Is also asking to be treated for STDs, she currently has no symptoms, thus we will test, but not treat.  Discussed that we will have call in 3 to 5 days, if she is positive for an STD.  No abdominal pain, urinary symptoms, vaginal discharge on my exam.  Patient in no acute distress.  Discharged home with strict return precautions  Amount and/or Complexity of Data Reviewed Labs: ordered.    Details: Urinalysis unremarkable    Final Clinical Impression(s) / ED Diagnoses Final diagnoses:  Acute left-sided low back pain with left-sided sciatica  Screening examination for STD (sexually transmitted disease)    Rx / DC Orders ED Discharge Orders     None         [name], Harley Alto, PA 07/24/23 2210    Ernie Avena, MD 07/25/23 0134

## 2023-07-25 ENCOUNTER — Telehealth (HOSPITAL_COMMUNITY): Payer: Self-pay

## 2023-07-25 LAB — GC/CHLAMYDIA PROBE AMP (~~LOC~~) NOT AT ARMC
Chlamydia: POSITIVE — AB
Comment: NEGATIVE
Comment: NORMAL
Neisseria Gonorrhea: NEGATIVE

## 2023-07-25 MED ORDER — DOXYCYCLINE HYCLATE 100 MG PO CAPS: 100.0000 mg | ORAL_CAPSULE | Freq: Two times a day (BID) | ORAL | 0 refills | Status: AC

## 2023-07-30 ENCOUNTER — Ambulatory Visit: Payer: 59 | Admitting: Family Medicine

## 2023-07-30 ENCOUNTER — Ambulatory Visit (HOSPITAL_COMMUNITY): Payer: 59 | Admitting: Psychiatry

## 2023-07-31 ENCOUNTER — Encounter: Payer: Self-pay | Admitting: Family

## 2023-07-31 ENCOUNTER — Ambulatory Visit (INDEPENDENT_AMBULATORY_CARE_PROVIDER_SITE_OTHER): Payer: 59 | Admitting: Family

## 2023-07-31 ENCOUNTER — Other Ambulatory Visit: Payer: Self-pay | Admitting: Family

## 2023-07-31 VITALS — BP 127/93 | HR 118 | Wt 260.4 lb

## 2023-07-31 DIAGNOSIS — R079 Chest pain, unspecified: Secondary | ICD-10-CM

## 2023-07-31 DIAGNOSIS — M545 Low back pain, unspecified: Secondary | ICD-10-CM | POA: Diagnosis not present

## 2023-07-31 DIAGNOSIS — G8929 Other chronic pain: Secondary | ICD-10-CM

## 2023-07-31 DIAGNOSIS — R9431 Abnormal electrocardiogram [ECG] [EKG]: Secondary | ICD-10-CM

## 2023-07-31 NOTE — Progress Notes (Signed)
I discussed plan of care with Georganna Skeans, MD.

## 2023-07-31 NOTE — Progress Notes (Addendum)
Patient ID: [name], female    DOB: Jan 21, 1996  MRN: [id number]  CC: Emergency Department Follow-Up  Subjective: [name] is a 27 y.o. female who presents for Emergency Department follow-up.  Her concerns today include:  07/24/2023 [facility] Emergency Department at [facility] per MD note: ED Course/ Medical Decision Making/ A&P                               Medical Decision Making Patient is a 27 year old female, here for left back pain, it has been going on for the last few weeks, worse today.  She has positive straight leg raise, no urinary symptoms.  Blood work, urinalysis obtained by triage prior to me evaluating her.  Is also asking to be treated for STDs, she currently has no symptoms, thus we will test, but not treat.  Discussed that we will have call in 3 to 5 days, if she is positive for an STD.  No abdominal pain, urinary symptoms, vaginal discharge on my exam.  Patient in no acute distress.  Discharged home with strict return precautions   Amount and/or Complexity of Data Reviewed Labs: ordered.    Details: Urinalysis unremarkable  Today's office visit 07/31/2023: - Reports she is doing well on Doxycycline for chlamydia management. Reports she is doing well on Metronidazole.  - Reports lower back pain persisting for more than 1 year. She denies recent trauma/injury and red flag symptoms. Requests xray.  - Chest pain x 3 days. She denies additional symptoms. States she did not go to Emergency Department prior to today for evaluation because the Emergency Department has been unhelpful. States she thinks chest pain is related to her anxiety. Reports she had an appointment scheduled with Behavioral Health on yesterday and needs to reschedule. She denies thoughts of self-harm, suicidal ideations, homicidal ideations.  Patient Active Problem List   Diagnosis Date Noted   Anxiety and depression 04/27/2023   POTS (postural orthostatic tachycardia  syndrome) 03/11/2023   Essential hypertension 03/05/2023   Rapid heart rate 03/05/2023   Abnormal Pap smear of cervix 01/03/2023   Low back pain 02/24/2022   Request for sterilization 08/28/2019   Status post hernia repair 03/26/2019     Current Outpatient Medications on File Prior to Visit  Medication Sig Dispense Refill   doxycycline (VIBRAMYCIN) 100 MG capsule Take 1 capsule (100 mg total) by mouth 2 (two) times daily for 7 days. 14 capsule 0   fluconazole (DIFLUCAN) 150 MG tablet Take 1 tablet (150 mg total) by mouth daily. 1 tablet 0   metroNIDAZOLE (FLAGYL) 500 MG tablet Take 1 tablet (500 mg total) by mouth 2 (two) times daily. 14 tablet 0   Norethindrone Acetate-Ethinyl Estrad-FE (JUNEL FE 24) 1-20 MG-MCG(24) tablet Take 1 tablet by mouth daily. 90 tablet 0   escitalopram (LEXAPRO) 10 MG tablet Take 1 tablet (10 mg total) by mouth at bedtime. 30 tablet 1   hydrOXYzine (VISTARIL) 25 MG capsule Take 1 capsule (25 mg total) by mouth every 8 (eight) hours as needed. 30 capsule 1   ibuprofen (ADVIL) 600 MG tablet Take 1 tablet (600 mg total) by mouth every 6 (six) hours as needed. Take every 6-8 hours starting on the first day of your period 60 tablet 1   norethindrone (AYGESTIN) 5 MG tablet Take 3 tablets (15 mg total) by mouth daily. 90 tablet 11   omeprazole (PRILOSEC) 20 MG capsule Take 1  capsule (20 mg total) by mouth daily. 90 capsule 0   propranolol (INDERAL) 10 MG tablet Take 0.5 tablets (5 mg total) by mouth 2 (two) times daily. (Patient not taking: Reported on 05/28/2023) 60 tablet 1   topiramate (TOPAMAX) 25 MG tablet Take 1 tablet (25 mg total) by mouth daily. 30 tablet 1   No current facility-administered medications on file prior to visit.    Allergies  Allergen Reactions   Penicillins Anaphylaxis, Itching, Swelling, Rash and Other (See Comments)    Has patient had a PCN reaction causing immediate rash, facial/tongue/throat swelling, SOB or lightheadedness with  hypotension: Yes Has patient had a PCN reaction causing severe rash involving mucus membranes or skin necrosis: No Has patient had a PCN reaction that required hospitalization: No Has patient had a PCN reaction occurring within the last 10 years: No If all of the above answers are "NO", then may proceed with Cephalosporin use.  And swelling   Clindamycin/Lincomycin Itching and Swelling    Social History   Socioeconomic History   Marital status: Legally Separated    Spouse name: Not on file   Number of children: Not on file   Years of education: Not on file   Highest education level: Not on file  Occupational History   Not on file  Tobacco Use   Smoking status: Never    Passive exposure: Never   Smokeless tobacco: Never  Vaping Use   Vaping status: Never Used  Substance and Sexual Activity   Alcohol use: Not Currently    Comment: last drink 2 yrs ago   Drug use: No   Sexual activity: Not Currently    Partners: Male, Female    Birth control/protection: None    Comment: female partner  Other Topics Concern   Not on file  Social History Narrative   Not on file   Social Determinants of Health   Financial Resource Strain: Not on file  Food Insecurity: Not on file  Transportation Needs: Not on file  Physical Activity: Not on file  Stress: Not on file  Social Connections: Not on file  Intimate Partner Violence: Not on file    Family History  Problem Relation Age of Onset   Hypertension Mother    Hypertension Father    Diabetes Paternal Grandmother    Lung cancer Paternal Grandfather        smoked   Cancer Paternal Grandfather    Seizures Paternal Grandfather    Breast cancer Maternal Aunt     Past Surgical History:  Procedure Laterality Date   CHOLECYSTECTOMY  04/03/2012   Procedure: LAPAROSCOPIC CHOLECYSTECTOMY WITH INTRAOPERATIVE CHOLANGIOGRAM;  Surgeon: Velora Heckler, MD;  Location: [facility];  Service: General;  Laterality: N/A;   ERCP  03/12/2012   Procedure:  ENDOSCOPIC RETROGRADE CHOLANGIOPANCREATOGRAPHY (ERCP);  Surgeon: Petra Kuba, MD;  Location: [facility] OR;  Service: Endoscopy;  Laterality: N/A;   INGUINAL HERNIA REPAIR N/A 05/15/2013   Procedure: HERNIA REPAIR INCISIONAL;  Surgeon: Velora Heckler, MD;  Location: [facility];  Service: General;  Laterality: N/A;   INSERTION OF MESH N/A 05/15/2013   Procedure: INSERTION OF MESH;  Surgeon: Velora Heckler, MD;  Location: [facility];  Service: General;  Laterality: N/A;   URETERAL STENT PLACEMENT     WISDOM TOOTH EXTRACTION      ROS: Review of Systems Negative except as stated above  PHYSICAL EXAM: BP (!) 127/93 (BP Location: Left Arm, Patient Position: Sitting, Cuff Size:  Large)   Pulse (!) 118   Wt 260 lb 6.4 oz (118.1 kg)   SpO2 93%   BMI 46.13 kg/m   Physical Exam HENT:     Head: Normocephalic and atraumatic.     Nose: Nose normal.     Mouth/Throat:     Mouth: Mucous membranes are moist.     Pharynx: Oropharynx is clear.  Eyes:     Extraocular Movements: Extraocular movements intact.     Conjunctiva/sclera: Conjunctivae normal.     Pupils: Pupils are equal, round, and reactive to light.  Cardiovascular:     Rate and Rhythm: Tachycardia present.     Pulses: Normal pulses.     Heart sounds: Normal heart sounds.  Pulmonary:     Effort: Pulmonary effort is normal.     Breath sounds: Normal breath sounds.  Musculoskeletal:        General: Normal range of motion.     Right shoulder: Normal.     Left shoulder: Normal.     Right upper arm: Normal.     Left upper arm: Normal.     Right elbow: Normal.     Left elbow: Normal.     Right forearm: Normal.     Left forearm: Normal.     Right wrist: Normal.     Left wrist: Normal.     Right hand: Normal.     Left hand: Normal.     Cervical back: Normal, normal range of motion and neck supple.     Thoracic back: Normal.     Lumbar back: Tenderness present.     Right hip: Normal.     Left hip: Normal.      Right upper leg: Normal.     Left upper leg: Normal.     Right knee: Normal.     Left knee: Normal.     Right lower leg: Normal.     Left lower leg: Normal.     Right ankle: Normal.     Left ankle: Normal.     Right foot: Normal.     Left foot: Normal.  Neurological:     General: No focal deficit present.     Mental Status: She is alert and oriented to person, place, and time.  Psychiatric:        Mood and Affect: Mood normal.        Behavior: Behavior normal.    ASSESSMENT AND PLAN: 1. Chronic low back pain, unspecified back pain laterality, unspecified whether sciatica present - Diagnostic xray lumbar spine for evaluation.  - Referral to Orthopedic Surgery for evaluation/management. During the interim follow-up with primary provider as scheduled until established with referral. - DG Lumbar Spine Complete; Future - Ambulatory referral to Orthopedic Surgery  2. Chest pain, unspecified type - Patient today in office with no cardiopulmonary/acute distress.  - Routine screening.  - Diagnostic xray chest for evaluation.  - EKG 12-Lead - DG Chest 2 View; Future   Patient was given the opportunity to ask questions.  Patient verbalized understanding of the plan and was able to repeat key elements of the plan. Patient was given clear instructions to go to Emergency Department or return to medical center if symptoms don't improve, worsen, or new problems develop.The patient verbalized understanding.   Orders Placed This Encounter  Procedures   DG Lumbar Spine Complete   DG Chest 2 View   Ambulatory referral to Orthopedic Surgery   EKG 12-Lead   Follow-up with primary provider as  scheduled.   Rema Fendt, NP

## 2023-08-02 ENCOUNTER — Encounter (HOSPITAL_COMMUNITY): Payer: Self-pay | Admitting: *Deleted

## 2023-08-02 ENCOUNTER — Emergency Department (HOSPITAL_COMMUNITY): Payer: 59

## 2023-08-02 ENCOUNTER — Emergency Department (HOSPITAL_COMMUNITY)
Admission: EM | Admit: 2023-08-02 | Discharge: 2023-08-02 | Discharge status: Against Medical Advice | Payer: 59 | Attending: Emergency Medicine | Admitting: Emergency Medicine

## 2023-08-02 ENCOUNTER — Other Ambulatory Visit: Payer: Self-pay

## 2023-08-02 DIAGNOSIS — R0602 Shortness of breath: Secondary | ICD-10-CM | POA: Diagnosis not present

## 2023-08-02 DIAGNOSIS — R000 Tachycardia, unspecified: Secondary | ICD-10-CM | POA: Insufficient documentation

## 2023-08-02 DIAGNOSIS — R079 Chest pain, unspecified: Secondary | ICD-10-CM | POA: Insufficient documentation

## 2023-08-02 DIAGNOSIS — Z5321 Procedure and treatment not carried out due to patient leaving prior to being seen by health care provider: Secondary | ICD-10-CM | POA: Insufficient documentation

## 2023-08-02 NOTE — ED Notes (Signed)
Unable to collect labs X1 stick. Veins to deep pt unable to tolerate.

## 2023-08-02 NOTE — ED Triage Notes (Signed)
5 days of chest pain on and off. States she has shob with cp and tachycardia. No N/v/D. Nothing makes it better, exertion makes it worse

## 2023-08-04 ENCOUNTER — Telehealth: Payer: 59 | Admitting: Physician Assistant

## 2023-08-04 DIAGNOSIS — N898 Other specified noninflammatory disorders of vagina: Secondary | ICD-10-CM | POA: Diagnosis not present

## 2023-08-04 MED ORDER — FLUCONAZOLE 150 MG PO TABS
ORAL_TABLET | ORAL | 0 refills | Status: DC
Start: 2023-08-04 — End: 2023-10-15

## 2023-08-04 NOTE — Progress Notes (Signed)

## 2023-08-07 NOTE — Progress Notes (Deleted)
Cardiology Office Note:  .   Date:  08/07/2023  ID:  [name], DOB 20-Dec-1995, MRN [id number] PCP: Rema Fendt, NP  [facility] Providers Cardiologist:  Bryan Lemma, MD  History of Present Illness: .   [name] is a 27 y.o. female with past medical history of POTS, anxiety, hypertension.  Patient is followed by Dr. Herbie Baltimore and presents today for evaluation of chest pain.  Per chart review, patient was first seen by Dr. Herbie Baltimore on 03/05/2023 for evaluation of rapid heartbeats, dizziness.  At that time, patient was on low-dose propranolol.  Cardiac monitor showed sinus rhythm, heart rate ranging from 46-156 bpm with an average of 6 bpm.  Symptoms noted with sinus rhythm, sinus tachycardia, usually with heart rates in the 90s-120s.  There were no abnormal rhythms to explain symptoms.  Patient was instructed to drink up to 80 ounces of water a day with electrolyte supplementation.  Also recommended that we consider transition to metoprolol at a follow-up appointment.  Chest Pain   POTS  -Cardiac monitor from 02/2023 showed sinus rhythm, no abnormal rhythms  ROS: ***  Studies Reviewed: .        *** Risk Assessment/Calculations:   {Does this patient have ATRIAL FIBRILLATION?[phone]} No BP recorded.  {Refresh Note OR Click here to enter BP  :1}***       Physical Exam:   VS:  There were no vitals taken for this visit.   Wt Readings from Last 3 Encounters:  08/02/23 260 lb (117.9 kg)  07/31/23 260 lb 6.4 oz (118.1 kg)  04/06/23 261 lb (118.4 kg)    GEN: Well nourished, well developed in no acute distress NECK: No JVD; No carotid bruits CARDIAC: ***RRR, no murmurs, rubs, gallops RESPIRATORY:  Clear to auscultation without rales, wheezing or rhonchi  ABDOMEN: Soft, non-tender, non-distended EXTREMITIES:  No edema; No deformity   ASSESSMENT AND PLAN: .   ***    {Are you ordering a CV Procedure (e.g. stress test, cath, DCCV, TEE, etc)?   Press  F2        :[id number]}  Dispo: ***  Signed, Jonita Albee, PA-C

## 2023-08-09 ENCOUNTER — Ambulatory Visit: Payer: 59

## 2023-08-10 ENCOUNTER — Ambulatory Visit: Payer: 59 | Attending: Cardiology | Admitting: Cardiology

## 2023-08-14 ENCOUNTER — Ambulatory Visit: Payer: 59 | Admitting: Family

## 2023-08-24 ENCOUNTER — Telehealth (HOSPITAL_BASED_OUTPATIENT_CLINIC_OR_DEPARTMENT_OTHER): Payer: 59 | Admitting: Psychiatry

## 2023-08-24 ENCOUNTER — Encounter (HOSPITAL_COMMUNITY): Payer: Self-pay | Admitting: Psychiatry

## 2023-08-24 VITALS — Wt 260.0 lb

## 2023-08-24 DIAGNOSIS — F419 Anxiety disorder, unspecified: Secondary | ICD-10-CM | POA: Diagnosis not present

## 2023-08-24 DIAGNOSIS — F4310 Post-traumatic stress disorder, unspecified: Secondary | ICD-10-CM | POA: Diagnosis not present

## 2023-08-24 DIAGNOSIS — F319 Bipolar disorder, unspecified: Secondary | ICD-10-CM

## 2023-08-24 MED ORDER — TRAZODONE HCL 50 MG PO TABS
50.0000 mg | ORAL_TABLET | Freq: Every evening | ORAL | 1 refills | Status: DC | PRN
Start: 2023-08-24 — End: 2023-10-15

## 2023-08-24 MED ORDER — BREXPIPRAZOLE 1 MG PO TABS: ORAL_TABLET | ORAL | 1 refills | Status: DC
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
## 2023-08-28 ENCOUNTER — Encounter (HOSPITAL_COMMUNITY): Payer: Self-pay

## 2023-09-05 NOTE — Progress Notes (Signed)
Cardiology Office Note:  .   Date:  09/14/2023  ID:  [name], DOB 06/07/1996, MRN [id number] PCP: Rema Fendt, NP  [facility] Providers Cardiologist:  Bryan Lemma, MD  History of Present Illness: .   [name] is a 27 y.o. female with past medical history of POTS, anxiety, hypertension.  Patient is followed by Dr. Herbie Baltimore and presents today for evaluation of chest pain.  Per chart review, patient was first seen by Dr. Herbie Baltimore on 03/05/2023 for evaluation of rapid heartbeats, dizziness.  At that time, patient was on low-dose propranolol.  Cardiac monitor showed sinus rhythm, heart rate ranging from 46-156 bpm with an average of 76 bpm.  Symptoms noted with sinus rhythm, sinus tachycardia, usually with heart rates in the 90s-120s.  There were no abnormal rhythms to explain symptoms.  Patient was instructed to drink up to 80 ounces of water a day with electrolyte supplementation.  Also recommended that we consider transition to metoprolol at a follow-up appointment.  Today, patient presents with recurrent episodes of chest pain and shortness of breath. The chest pain is described as a squeezing sensation, which sometimes coincides with the shortness of breath. These episodes are triggered by standing up and walking, and occur every time the patient stands up. The patient also reports feeling lightheaded and experiencing a racing heart during these episodes.  Previously, the patient was on propranolol, but discontinued it due to perceived worsening of symptoms. The patient's hydration and nutrition have improved. She has been drinking more water and occasionally drinks low-sugar Gatorade for hydration. She has also been eating regular meals throughout the day.  The patient also reports a family history of a heart condition in her grandmother, characterized by a heart rate that fluctuates between extremely fast and slow.   ROS: Reports palpitations, shortness of  breath, chest pain upon standing up. Denies syncope, near syncope, dizziness   Studies Reviewed: .   Cardiac Studies & Procedures         MONITORS  LONG TERM MONITOR (3-14 DAYS) 03/20/2023  Narrative   ~4 Day Zio Patch Monitor Results: May 2024   Patient was in sinus rhythm throughout the entire monitored  interval.  Heart rate range was 46-156 bpm, average of 76 bpm.   Rare premature atrial beats and premature ventricular beats. (<1%).  No couplets or triplets noted.  No bigeminy or trigeminy noted.   No Short/Nonsustained or Sustained Arrhythmias: Atrial Tachycardia (AT), Supraventricular Tachycardia (SVT), Atrial Fibrillation (A-Fib), Atrial Flutter (A-Flutter), Sustained Ventricular Tachycardia (VT)   Symptoms were noted with sinus rhythm and sinus tachycardia.  For the most part symptoms were noted with heart rates from 90 to 120 bpm.  Unfortunately his monitor does not really help without much.  There is no abnormal rhythms.  The heart seems to be working right just sometimes goes fast.  It is hard to tell what she was doing when this occurred.  Unfortunately, based on these results, not able to determine any potential changes to recommendations.  Bryan Lemma, MD            Risk Assessment/Calculations:             Physical Exam:   VS:  BP 108/86 (BP Location: Left Arm, Patient Position: Sitting, Cuff Size: Large)   Pulse 84   Ht 5\' 4"  (1.626 m)   Wt 262 lb 3.2 oz (118.9 kg)   LMP 08/10/2023 (Exact Date)   SpO2 98%   BMI  45.01 kg/m    Wt Readings from Last 3 Encounters:  09/14/23 262 lb 3.2 oz (118.9 kg)  09/07/23 261 lb 12.8 oz (118.8 kg)  08/02/23 260 lb (117.9 kg)    GEN: Well nourished, well developed in no acute distress. Sitting comfortably on the exam table  NECK: No JVD  CARDIAC:  RRR, no murmurs, rubs, gallops. Radial pulses 2+ bilaterally  RESPIRATORY:  Clear to auscultation without rales, wheezing or rhonchi. Normal work of breathing on room air   ABDOMEN: Soft, non-tender, non-distended EXTREMITIES:  Trace edema in BLE; No deformity   ASSESSMENT AND PLAN: .    POTS Chest Pain  Palpitations  - Patient reports having palpitations, chest pain, and shortness of breath when standing up. Occurs when she goes from sitting to standing. Denies chest pain on exertion. When walking up stairs, gets a bit short of breath but does not have chest pain  - EKG today showed NSR, no ischemic changes  - When seen by Dr. Herbie Baltimore in 02/2023- suspected POTS. Patient was previously on propanolol, but felt like the symptoms worsened. Propranolol was stopped -Cardiac monitor from 02/2023 showed sinus rhythm. No arrhythmias. Symptoms were noted with both sinus rhythm and sinus tachycardia  - Ordered echocardiogram  - Start metoprolol tartrate 12.5 mg BID  - Discussed the importance of adequate hydration, increasing salt intake. Also discussed increasing exercise as tolerated      Dispo: Follow up in 4 months   Signed, Jonita Albee, PA-C

## 2023-09-07 ENCOUNTER — Ambulatory Visit: Payer: 59

## 2023-09-07 ENCOUNTER — Other Ambulatory Visit (HOSPITAL_COMMUNITY)
Admission: RE | Admit: 2023-09-07 | Discharge: 2023-09-07 | Disposition: A | Discharge status: Home / Self Care | Payer: 59 | Source: Ambulatory Visit | Attending: Family | Admitting: Family

## 2023-09-07 ENCOUNTER — Encounter: Payer: Self-pay | Admitting: Family

## 2023-09-07 ENCOUNTER — Ambulatory Visit (INDEPENDENT_AMBULATORY_CARE_PROVIDER_SITE_OTHER): Payer: 59 | Admitting: Family

## 2023-09-07 VITALS — BP 97/65 | HR 87 | Temp 99.0°F | Ht 65.0 in | Wt 261.8 lb

## 2023-09-07 DIAGNOSIS — Z113 Encounter for screening for infections with a predominantly sexual mode of transmission: Secondary | ICD-10-CM | POA: Insufficient documentation

## 2023-09-07 DIAGNOSIS — M545 Low back pain, unspecified: Secondary | ICD-10-CM

## 2023-09-07 DIAGNOSIS — G8929 Other chronic pain: Secondary | ICD-10-CM

## 2023-09-07 DIAGNOSIS — M419 Scoliosis, unspecified: Secondary | ICD-10-CM | POA: Diagnosis not present

## 2023-09-07 NOTE — Progress Notes (Signed)
Patient ID: [name], female    DOB: 1995-10-28  MRN: [id number]  CC: Follow-Up  Subjective: [name] is a 27 y.o. female who presents for follow-up.   Her concerns today include:  - Reports back pain persisting. She denies recent trauma/injury and red flag symptoms. Reports Orthopedics did not call to schedule appointment.  - Diagnostic chest xray from 08/02/2023 results: No active cardiopulmonary disease.  - Requests routine STD screening. Denies symptoms/recent exposure. She declines additional STD screening.   Patient Active Problem List   Diagnosis Date Noted   Anxiety and depression 04/27/2023   POTS (postural orthostatic tachycardia syndrome) 03/11/2023   Essential hypertension 03/05/2023   Rapid heart rate 03/05/2023   Abnormal Pap smear of cervix 01/03/2023   Low back pain 02/24/2022   Request for sterilization 08/28/2019   Status post hernia repair 03/26/2019     Current Outpatient Medications on File Prior to Visit  Medication Sig Dispense Refill   brexpiprazole (REXULTI) 1 MG TABS tablet Take 1/2 tab daily for one week and than one tab daily 30 tablet 1   fluconazole (DIFLUCAN) 150 MG tablet Take one tablet by mouth x 1 day. May repeat in 72 hours if symptoms don't improve. 2 tablet 0   traZODone (DESYREL) 50 MG tablet Take 1 tablet (50 mg total) by mouth at bedtime as needed for sleep. 30 tablet 1   ibuprofen (ADVIL) 600 MG tablet Take 1 tablet (600 mg total) by mouth every 6 (six) hours as needed. Take every 6-8 hours starting on the first day of your period 60 tablet 1   metroNIDAZOLE (FLAGYL) 500 MG tablet Take 1 tablet (500 mg total) by mouth 2 (two) times daily. (Patient not taking: Reported on 09/07/2023) 14 tablet 0   norethindrone (AYGESTIN) 5 MG tablet Take 3 tablets (15 mg total) by mouth daily. 90 tablet 11   Norethindrone Acetate-Ethinyl Estrad-FE (JUNEL FE 24) 1-20 MG-MCG(24) tablet Take 1 tablet by mouth daily. (Patient not taking:  Reported on 09/07/2023) 90 tablet 0   omeprazole (PRILOSEC) 20 MG capsule Take 1 capsule (20 mg total) by mouth daily. 90 capsule 0   No current facility-administered medications on file prior to visit.    Allergies  Allergen Reactions   Penicillins Anaphylaxis, Itching, Swelling, Rash and Other (See Comments)    Has patient had a PCN reaction causing immediate rash, facial/tongue/throat swelling, SOB or lightheadedness with hypotension: Yes Has patient had a PCN reaction causing severe rash involving mucus membranes or skin necrosis: No Has patient had a PCN reaction that required hospitalization: No Has patient had a PCN reaction occurring within the last 10 years: No If all of the above answers are "NO", then may proceed with Cephalosporin use.  And swelling   Clindamycin/Lincomycin Itching and Swelling    Social History   Socioeconomic History   Marital status: Legally Separated    Spouse name: Not on file   Number of children: Not on file   Years of education: Not on file   Highest education level: Not on file  Occupational History   Not on file  Tobacco Use   Smoking status: Never    Passive exposure: Never   Smokeless tobacco: Never  Vaping Use   Vaping status: Never Used  Substance and Sexual Activity   Alcohol use: Not Currently    Comment: last drink 2 yrs ago   Drug use: No   Sexual activity: Not Currently    Partners: Male, Female  Birth control/protection: None    Comment: female partner  Other Topics Concern   Not on file  Social History Narrative   Not on file   Social Determinants of Health   Financial Resource Strain: Not on file  Food Insecurity: Not on file  Transportation Needs: Not on file  Physical Activity: Not on file  Stress: Not on file  Social Connections: Not on file  Intimate Partner Violence: Not on file    Family History  Problem Relation Age of Onset   Hypertension Mother    Hypertension Father    Diabetes Paternal  Grandmother    Lung cancer Paternal Grandfather        smoked   Cancer Paternal Grandfather    Seizures Paternal Grandfather    Breast cancer Maternal Aunt     Past Surgical History:  Procedure Laterality Date   CHOLECYSTECTOMY  04/03/2012   Procedure: LAPAROSCOPIC CHOLECYSTECTOMY WITH INTRAOPERATIVE CHOLANGIOGRAM;  Surgeon: Velora Heckler, MD;  Location: [facility];  Service: General;  Laterality: N/A;   ERCP  03/12/2012   Procedure: ENDOSCOPIC RETROGRADE CHOLANGIOPANCREATOGRAPHY (ERCP);  Surgeon: Petra Kuba, MD;  Location: [facility] OR;  Service: Endoscopy;  Laterality: N/A;   INGUINAL HERNIA REPAIR N/A 05/15/2013   Procedure: HERNIA REPAIR INCISIONAL;  Surgeon: Velora Heckler, MD;  Location: [facility];  Service: General;  Laterality: N/A;   INSERTION OF MESH N/A 05/15/2013   Procedure: INSERTION OF MESH;  Surgeon: Velora Heckler, MD;  Location: [facility];  Service: General;  Laterality: N/A;   URETERAL STENT PLACEMENT     WISDOM TOOTH EXTRACTION      ROS: Review of Systems Negative except as stated above  PHYSICAL EXAM: BP 97/65   Pulse 87   Temp 99 F (37.2 C) (Oral)   Ht 5\' 5"  (1.651 m)   Wt 261 lb 12.8 oz (118.8 kg)   LMP 08/10/2023 (Exact Date)   SpO2 97%   BMI 43.57 kg/m   Physical Exam HENT:     Head: Normocephalic and atraumatic.     Nose: Nose normal.     Mouth/Throat:     Mouth: Mucous membranes are moist.     Pharynx: Oropharynx is clear.  Eyes:     Extraocular Movements: Extraocular movements intact.     Conjunctiva/sclera: Conjunctivae normal.     Pupils: Pupils are equal, round, and reactive to light.  Cardiovascular:     Rate and Rhythm: Normal rate and regular rhythm.     Pulses: Normal pulses.     Heart sounds: Normal heart sounds.  Pulmonary:     Effort: Pulmonary effort is normal.     Breath sounds: Normal breath sounds.  Musculoskeletal:        General: Normal range of motion.     Right shoulder: Normal.     Left  shoulder: Normal.     Right upper arm: Normal.     Left upper arm: Normal.     Right elbow: Normal.     Left elbow: Normal.     Right forearm: Normal.     Left forearm: Normal.     Right wrist: Normal.     Left wrist: Normal.     Right hand: Normal.     Left hand: Normal.     Cervical back: Normal, normal range of motion and neck supple.     Thoracic back: Normal.     Lumbar back: Normal.     Right hip: Normal.  Left hip: Normal.     Right upper leg: Normal.     Left upper leg: Normal.     Right knee: Normal.     Left knee: Normal.     Right lower leg: Normal.     Left lower leg: Normal.     Right ankle: Normal.     Left ankle: Normal.     Right foot: Normal.     Left foot: Normal.  Neurological:     General: No focal deficit present.     Mental Status: She is alert and oriented to person, place, and time.  Psychiatric:        Mood and Affect: Mood normal.        Behavior: Behavior normal.     ASSESSMENT AND PLAN: 1. Chronic low back pain, unspecified back pain laterality, unspecified whether sciatica present - Diagnostic lumbar spine for evaluation. - Referral to Orthopedic Surgery for evaluation/management.  - Follow-up with primary provider as scheduled until established with referral. - DG Lumbar Spine Complete; Future - Ambulatory referral to Orthopedic Surgery  2. Routine screening for STI (sexually transmitted infection) - Routine screening.  - Cervicovaginal ancillary only   Patient was given the opportunity to ask questions.  Patient verbalized understanding of the plan and was able to repeat key elements of the plan. Patient was given clear instructions to go to Emergency Department or return to medical center if symptoms don't improve, worsen, or new problems develop.The patient verbalized understanding.   Orders Placed This Encounter  Procedures   DG Lumbar Spine Complete   Ambulatory referral to Orthopedic Surgery    Follow-up with primary  provider as scheduled.  Rema Fendt, NP

## 2023-09-07 NOTE — Progress Notes (Signed)
Patient is asking for x-ray and std testing. States only the swb test to be done.   Declined Flu vaccine.

## 2023-09-10 LAB — CERVICOVAGINAL ANCILLARY ONLY
Bacterial Vaginitis (gardnerella): NEGATIVE
Candida Glabrata: NEGATIVE
Candida Vaginitis: NEGATIVE
Chlamydia: NEGATIVE
Comment: NEGATIVE
Comment: NEGATIVE
Comment: NEGATIVE
Comment: NEGATIVE
Comment: NEGATIVE
Comment: NORMAL
Neisseria Gonorrhea: NEGATIVE
Trichomonas: NEGATIVE

## 2023-09-14 ENCOUNTER — Ambulatory Visit: Payer: 59 | Attending: Cardiology | Admitting: Cardiology

## 2023-09-14 ENCOUNTER — Encounter: Payer: Self-pay | Admitting: Cardiology

## 2023-09-14 VITALS — BP 108/86 | HR 84 | Ht 64.0 in | Wt 262.2 lb

## 2023-09-14 DIAGNOSIS — I10 Essential (primary) hypertension: Secondary | ICD-10-CM | POA: Diagnosis not present

## 2023-09-14 DIAGNOSIS — R002 Palpitations: Secondary | ICD-10-CM | POA: Diagnosis not present

## 2023-09-14 DIAGNOSIS — G90A Postural orthostatic tachycardia syndrome (POTS): Secondary | ICD-10-CM

## 2023-09-14 DIAGNOSIS — R000 Tachycardia, unspecified: Secondary | ICD-10-CM

## 2023-09-14 MED ORDER — METOPROLOL TARTRATE 25 MG PO TABS
12.5000 mg | ORAL_TABLET | Freq: Two times a day (BID) | ORAL | 3 refills | Status: DC
Start: 2023-09-14 — End: 2023-10-15

## 2023-09-14 NOTE — Patient Instructions (Addendum)
Medication Instructions:  Start Metoprolol Tartrate 12.5 mg twice a day (1/2 a tablet) *If you need a refill on your cardiac medications before your next appointment, please call your pharmacy*  Lab Work: No labs If you have labs (blood work) drawn today and your tests are completely normal, you will receive your results only by: MyChart Message (if you have MyChart) OR A paper copy in the mail If you have any lab test that is abnormal or we need to change your treatment, we will call you to review the results.  Testing/Procedures: Your physician has requested that you have an echocardiogram. Echocardiography is a painless test that uses sound waves to create images of your heart. It provides your doctor with information about the size and shape of your heart and how well your heart's chambers and valves are working. This procedure takes approximately one hour. There are no restrictions for this procedure. Please do NOT wear cologne, perfume, aftershave, or lotions (deodorant is allowed). Please arrive 15 minutes prior to your appointment time.  Please note: We ask at that you not bring children with you during ultrasound (echo/ vascular) testing. Due to room size and safety concerns, children are not allowed in the ultrasound rooms during exams. Our front office staff cannot provide observation of children in our lobby area while testing is being conducted. An adult accompanying a patient to their appointment will only be allowed in the ultrasound room at the discretion of the ultrasound technician under special circumstances. We apologize for any inconvenience.   Follow-Up: At [facility], you and your health needs are our priority.  As part of our continuing mission to provide you with exceptional heart care, we have created designated Provider Care Teams.  These Care Teams include your primary Cardiologist (physician) and Advanced Practice Providers (APPs -  Physician Assistants and  Nurse Practitioners) who all work together to provide you with the care you need, when you need it.  We recommend signing up for the patient portal called "MyChart".  Sign up information is provided on this After Visit Summary.  MyChart is used to connect with patients for Virtual Visits (Telemedicine).  Patients are able to view lab/test results, encounter notes, upcoming appointments, etc.  Non-urgent messages can be sent to your provider as well.   To learn more about what you can do with MyChart, go to [email or web address].    Your next appointment:   4 month(s)  Provider:   Bryan Lemma, MD  or Robet Leu, PA-C

## 2023-09-28 ENCOUNTER — Telehealth (HOSPITAL_BASED_OUTPATIENT_CLINIC_OR_DEPARTMENT_OTHER): Payer: 59 | Admitting: Psychiatry

## 2023-09-28 ENCOUNTER — Encounter (HOSPITAL_COMMUNITY): Payer: Self-pay | Admitting: Psychiatry

## 2023-09-28 VITALS — Wt 262.0 lb

## 2023-09-28 DIAGNOSIS — R413 Other amnesia: Secondary | ICD-10-CM

## 2023-09-28 DIAGNOSIS — F319 Bipolar disorder, unspecified: Secondary | ICD-10-CM

## 2023-09-28 DIAGNOSIS — F4310 Post-traumatic stress disorder, unspecified: Secondary | ICD-10-CM | POA: Diagnosis not present

## 2023-09-28 DIAGNOSIS — F419 Anxiety disorder, unspecified: Secondary | ICD-10-CM

## 2023-09-28 MED ORDER — HYDROXYZINE PAMOATE 25 MG PO CAPS
25.0000 mg | ORAL_CAPSULE | Freq: Three times a day (TID) | ORAL | 1 refills | Status: AC | PRN
Start: 2023-09-28 — End: ?

## 2023-09-28 MED ORDER — BREXPIPRAZOLE 1 MG PO TABS: ORAL_TABLET | ORAL | 1 refills | Status: AC
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
## 2023-10-02 ENCOUNTER — Ambulatory Visit (HOSPITAL_COMMUNITY): Admission: RE | Admit: 2023-10-02 | Payer: 59 | Source: Ambulatory Visit | Attending: Cardiology | Admitting: Cardiology

## 2023-10-04 ENCOUNTER — Encounter (HOSPITAL_COMMUNITY): Payer: Self-pay

## 2023-10-15 ENCOUNTER — Encounter: Payer: Self-pay | Admitting: Neurology

## 2023-10-15 ENCOUNTER — Ambulatory Visit (INDEPENDENT_AMBULATORY_CARE_PROVIDER_SITE_OTHER): Payer: 59 | Admitting: Neurology

## 2023-10-15 VITALS — BP 139/78 | HR 77 | Ht 65.0 in | Wt 272.0 lb

## 2023-10-15 DIAGNOSIS — R4189 Other symptoms and signs involving cognitive functions and awareness: Secondary | ICD-10-CM

## 2023-10-15 DIAGNOSIS — F32A Depression, unspecified: Secondary | ICD-10-CM

## 2023-10-15 DIAGNOSIS — R5383 Other fatigue: Secondary | ICD-10-CM | POA: Diagnosis not present

## 2023-10-15 DIAGNOSIS — Z9189 Other specified personal risk factors, not elsewhere classified: Secondary | ICD-10-CM | POA: Insufficient documentation

## 2023-10-15 DIAGNOSIS — G44001 Cluster headache syndrome, unspecified, intractable: Secondary | ICD-10-CM | POA: Diagnosis not present

## 2023-10-15 DIAGNOSIS — G43011 Migraine without aura, intractable, with status migrainosus: Secondary | ICD-10-CM | POA: Insufficient documentation

## 2023-10-15 MED ORDER — ALPRAZOLAM 1 MG PO TABS: 1.0000 mg | ORAL_TABLET | Freq: Every evening | ORAL | 0 refills | Status: DC | PRN
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
## 2023-10-15 NOTE — Addendum Note (Signed)
Addended by: Melvyn Novas on: 10/15/2023 03:06 PM   Modules accepted: Orders

## 2023-10-23 ENCOUNTER — Encounter (HOSPITAL_COMMUNITY): Payer: Self-pay

## 2023-10-26 ENCOUNTER — Telehealth: Payer: Self-pay | Admitting: Family

## 2023-10-26 NOTE — Telephone Encounter (Signed)
 Marland Kitchen

## 2023-10-26 NOTE — Telephone Encounter (Signed)
 Called pt and pt declined appt; stated "she doesn't need it"

## 2023-10-30 ENCOUNTER — Telehealth (HOSPITAL_COMMUNITY): Payer: Self-pay

## 2023-10-30 NOTE — Telephone Encounter (Signed)
 Please include the diagnosis in the letter.

## 2023-10-30 NOTE — Telephone Encounter (Signed)
 I received a form in the mail form the patients leasing company. It is requesting information on an assistance animal. We wrote a letter for her on 10/04/2023. This form asks more detailed questions - How the animal helps the patient - does she meet the federal definition of disabled - etc. Please review and advise, thank you

## 2023-10-31 ENCOUNTER — Ambulatory Visit: Payer: Medicaid Other | Admitting: Physical Medicine and Rehabilitation

## 2023-10-31 ENCOUNTER — Encounter: Payer: Self-pay | Admitting: Physical Medicine and Rehabilitation

## 2023-10-31 DIAGNOSIS — G8929 Other chronic pain: Secondary | ICD-10-CM | POA: Diagnosis not present

## 2023-10-31 DIAGNOSIS — M4306 Spondylolysis, lumbar region: Secondary | ICD-10-CM

## 2023-10-31 DIAGNOSIS — M5416 Radiculopathy, lumbar region: Secondary | ICD-10-CM

## 2023-10-31 DIAGNOSIS — M5441 Lumbago with sciatica, right side: Secondary | ICD-10-CM | POA: Diagnosis not present

## 2023-10-31 NOTE — Telephone Encounter (Signed)
 I will discuss with the patient on her next appointment and go over with the forms.

## 2023-10-31 NOTE — Progress Notes (Signed)
 [name] - 28 y.o. female MRN [id number]  Date of birth: May 17, 1996  Office Visit Note: Visit Date: 10/31/2023 PCP: Lorren Greig PARAS, NP Referred by: Lorren Greig PARAS, NP  Subjective: Chief Complaint  Patient presents with   Lower Back - Pain   HPI: [name] is a 28 y.o. female who comes in today per the request of Greig Lorren, NP for evaluation of chronic, worsening and severe bilateral lower back pain radiating to buttocks, intermittent radiation of pain down right lateral leg. Pain ongoing for 4 years, worsens with prolonged sitting. She describes her pain as sore, aching and grinding sensation, currently rates as 10 out of 10. Some relief of pain with home exercise regimen, rest and use of medications. No relief of pain with oral steroids and Flexeril . No history of formal physical therapy. Recent lumbar x-rays shows slight levoscoliosis, L5 pars defects, no spondylolisthesis noted. No history of lumbar surgery/injections. Patient currently working full time at [employer], she works as tourist information centre manager. Patient denies focal weakness, numbness and tingling. No recent trauma or falls.      Review of Systems  Musculoskeletal:  Positive for back pain.  Neurological:  Negative for tingling, sensory change, focal weakness and weakness.  All other systems reviewed and are negative.  Otherwise per HPI.  Assessment & Plan: Visit Diagnoses:    ICD-10-CM   1. Chronic bilateral low back pain with right-sided sciatica  M54.41    G89.29     2. Lumbar radiculopathy  M54.16     3. Pars defect of lumbar spine  M43.06        Plan: Findings:  Chronic, worsening and severe bilateral lower back pain radiating to buttocks, intermittent radiation of pain down right lateral leg. Patient continues to have severe pain despite good conservative therapies such as home exercise regimen, rest and use of medications. Patients clinical presentation and exam are consistent with  L5 nerve pattern. There are L5 pars defects noted on recent lumbar radiographs. Given the chronicity of her pain, radicular symptoms and functional difficulties next step is to place order for lumbar MRI imaging. Depending on results of lumbar MRI imaging we discussed possibility of performing lumbar epidural steroid injection. I also placed order for short course of formal physical therapy. I feel she would benefit from core strengthening and developing home exercise regimen. Patient has no questions at this time. I will see her back for lumbar MRI review and to discuss treatment options. No red flag symptoms noted upon exam today.     Meds & Orders: No orders of the defined types were placed in this encounter.  No orders of the defined types were placed in this encounter.   Follow-up: Return for Lumbar MRI review.   Procedures: No procedures performed      Clinical History: CLINICAL DATA:  Chronic low back pain   EXAM: LUMBAR SPINE - COMPLETE 4+ VIEW   COMPARISON:  12/29/2021   FINDINGS: Similar slight levoscoliosis of the lower thoracic and lumbar spine measuring approximately 9 degrees (Cobb angle) from T9-L4. SI joints are maintained. Preserved vertebral heights and disc spaces. L5 pars defects noted. No acute osseous finding or fracture. Normal alignment otherwise.   IMPRESSION: 1. L5 pars defects. 2. Slight levoscoliosis. 3. No acute finding by plain radiography.     Electronically Signed   By: CHRISTELLA.  Shick M.D.   On: 09/22/2023 12:00   She reports that she has never smoked. She has never been  exposed to tobacco smoke. She has never used smokeless tobacco.  Recent Labs    01/04/23 1101  HGBA1C 5.2    Objective:  VS:  HT:    WT:   BMI:     BP:   HR: bpm  TEMP: ( )  RESP:  Physical Exam Vitals and nursing note reviewed.  HENT:     Head: Normocephalic and atraumatic.     Right Ear: External ear normal.     Left Ear: External ear normal.     Nose: Nose normal.      Mouth/Throat:     Mouth: Mucous membranes are moist.  Eyes:     Extraocular Movements: Extraocular movements intact.  Cardiovascular:     Rate and Rhythm: Normal rate.     Pulses: Normal pulses.  Pulmonary:     Effort: Pulmonary effort is normal.  Abdominal:     General: Abdomen is flat. There is no distension.  Musculoskeletal:        General: Tenderness present.     Cervical back: Normal range of motion.     Comments: Patient rises from seated position to standing without difficulty. Good lumbar range of motion. No pain noted with facet loading. 5/5 strength noted with bilateral hip flexion, knee flexion/extension, ankle dorsiflexion/plantarflexion and EHL. No clonus noted bilaterally. No pain upon palpation of greater trochanters. No pain with internal/external rotation of bilateral hips. Sensation intact bilaterally. Dysesthesias noted to right L5 dermatome. Negative slump test bilaterally. Ambulates without aid, gait steady.     Skin:    General: Skin is warm and dry.     Capillary Refill: Capillary refill takes less than 2 seconds.  Neurological:     General: No focal deficit present.     Mental Status: She is alert and oriented to person, place, and time.  Psychiatric:        Mood and Affect: Mood normal.        Behavior: Behavior normal.     Ortho Exam  Imaging: No results found.  Past Medical/Family/Surgical/Social History: Medications & Allergies reviewed per EMR, new medications updated. Patient Active Problem List   Diagnosis Date Noted   Subjective cognitive impairment 10/15/2023   At risk for obstructive sleep apnea 10/15/2023   Fatigue due to depression 10/15/2023   Morbid obesity (HCC) 10/15/2023   Intractable cluster headache syndrome 10/15/2023   Intractable migraine without aura and with status migrainosus 10/15/2023   Anxiety and depression 04/27/2023   POTS (postural orthostatic tachycardia syndrome) 03/11/2023   Essential hypertension 03/05/2023    Rapid heart rate 03/05/2023   Abnormal Pap smear of cervix 01/03/2023   Low back pain 02/24/2022   Request for sterilization 08/28/2019   Status post hernia repair 03/26/2019   Past Medical History:  Diagnosis Date   Anxiety    Chlamydia 2018, 2021   Chronic kidney disease    kidney stones and kidney infection with last pregnancy   Depression    gets in her moods, currently ok'   Gonorrhea 2018   Headache    Incisional hernia 04/2013   Infection    UTI   LGSIL on Pap smear of cervix 2019   Ovarian cyst    Urethral diverticulum    Family History  Problem Relation Age of Onset   Hypertension Mother    Hypertension Father    Diabetes Paternal Grandmother    Lung cancer Paternal Grandfather        smoked   Cancer Paternal Grandfather  Seizures Paternal Grandfather    Breast cancer Maternal Aunt    Past Surgical History:  Procedure Laterality Date   CHOLECYSTECTOMY  04/03/2012   Procedure: LAPAROSCOPIC CHOLECYSTECTOMY WITH INTRAOPERATIVE CHOLANGIOGRAM;  Surgeon: Krystal CHRISTELLA Spinner, MD;  Location: [facility];  Service: General;  Laterality: N/A;   ERCP  03/12/2012   Procedure: ENDOSCOPIC RETROGRADE CHOLANGIOPANCREATOGRAPHY (ERCP);  Surgeon: Oliva FORBES Boots, MD;  Location: [facility];  Service: Endoscopy;  Laterality: N/A;   INGUINAL HERNIA REPAIR N/A 05/15/2013   Procedure: HERNIA REPAIR INCISIONAL;  Surgeon: Krystal CHRISTELLA Spinner, MD;  Location: [facility];  Service: General;  Laterality: N/A;   INSERTION OF MESH N/A 05/15/2013   Procedure: INSERTION OF MESH;  Surgeon: Krystal CHRISTELLA Spinner, MD;  Location: [facility];  Service: General;  Laterality: N/A;   URETERAL STENT PLACEMENT     WISDOM TOOTH EXTRACTION     Social History   Occupational History   Not on file  Tobacco Use   Smoking status: Never    Passive exposure: Never   Smokeless tobacco: Never  Vaping Use   Vaping status: Never Used  Substance and Sexual Activity   Alcohol use: Not Currently    Comment:  last drink 2 yrs ago   Drug use: No   Sexual activity: Not Currently    Partners: Male, Female    Birth control/protection: None    Comment: female partner

## 2023-10-31 NOTE — Progress Notes (Signed)
 Lower back pain, 3 years now and getting worse. She stated no injury.  Unable to sleep due to the pain.

## 2023-11-01 ENCOUNTER — Ambulatory Visit (HOSPITAL_COMMUNITY): Payer: 59 | Attending: Cardiology

## 2023-11-02 ENCOUNTER — Encounter: Payer: Self-pay | Admitting: Neurology

## 2023-11-05 ENCOUNTER — Ambulatory Visit
Admission: RE | Admit: 2023-11-05 | Discharge: 2023-11-05 | Disposition: A | Discharge status: Home / Self Care | Payer: 59 | Source: Ambulatory Visit | Attending: Physical Medicine and Rehabilitation | Admitting: Physical Medicine and Rehabilitation

## 2023-11-05 ENCOUNTER — Encounter: Payer: Self-pay | Admitting: Physical Medicine and Rehabilitation

## 2023-11-05 ENCOUNTER — Encounter: Payer: Self-pay | Admitting: Neurology

## 2023-11-05 ENCOUNTER — Ambulatory Visit
Admission: RE | Admit: 2023-11-05 | Discharge: 2023-11-05 | Disposition: A | Discharge status: Home / Self Care | Payer: 59 | Source: Ambulatory Visit | Attending: Neurology | Admitting: Neurology

## 2023-11-05 DIAGNOSIS — G44001 Cluster headache syndrome, unspecified, intractable: Secondary | ICD-10-CM

## 2023-11-05 DIAGNOSIS — M5416 Radiculopathy, lumbar region: Secondary | ICD-10-CM

## 2023-11-05 DIAGNOSIS — R4189 Other symptoms and signs involving cognitive functions and awareness: Secondary | ICD-10-CM

## 2023-11-05 DIAGNOSIS — M4306 Spondylolysis, lumbar region: Secondary | ICD-10-CM | POA: Diagnosis not present

## 2023-11-05 DIAGNOSIS — G43011 Migraine without aura, intractable, with status migrainosus: Secondary | ICD-10-CM | POA: Diagnosis not present

## 2023-11-05 DIAGNOSIS — Z9189 Other specified personal risk factors, not elsewhere classified: Secondary | ICD-10-CM

## 2023-11-05 DIAGNOSIS — G8929 Other chronic pain: Secondary | ICD-10-CM

## 2023-11-05 DIAGNOSIS — G90A Postural orthostatic tachycardia syndrome (POTS): Secondary | ICD-10-CM

## 2023-11-05 DIAGNOSIS — M5441 Lumbago with sciatica, right side: Secondary | ICD-10-CM | POA: Diagnosis not present

## 2023-11-06 ENCOUNTER — Encounter: Payer: Self-pay | Admitting: Physical Medicine and Rehabilitation

## 2023-11-09 ENCOUNTER — Telehealth: Payer: 59 | Admitting: Nurse Practitioner

## 2023-11-09 ENCOUNTER — Other Ambulatory Visit: Payer: 59

## 2023-11-09 DIAGNOSIS — M545 Low back pain, unspecified: Secondary | ICD-10-CM

## 2023-11-09 MED ORDER — CYCLOBENZAPRINE HCL 10 MG PO TABS: 10.0000 mg | ORAL_TABLET | Freq: Three times a day (TID) | ORAL | 0 refills | Status: AC | PRN

## 2023-11-09 MED ORDER — NAPROXEN 500 MG PO TABS: 500.0000 mg | ORAL_TABLET | Freq: Two times a day (BID) | ORAL | 0 refills | Status: DC

## 2023-11-09 NOTE — Progress Notes (Signed)
E-Visit for Back Pain   We are sorry that you are not feeling well.  Here is how we plan to help!  Based on what you have shared with me it looks like you mostly have acute back pain.  Acute back pain is defined as musculoskeletal pain that can resolve in 1-3 weeks with conservative treatment.  I have prescribed Naprosyn 500 mg take one by mouth twice a day non-steroid anti-inflammatory (NSAID) as well as Flexeril 10 mg every eight hours as needed which is a muscle relaxer  Some patients experience stomach irritation or in increased heartburn with anti-inflammatory drugs.  Please keep in mind that muscle relaxer's can cause fatigue and should not be taken while at work or driving.  Back pain is very common.  The pain often gets better over time.  The cause of back pain is usually not dangerous.  Most people can learn to manage their back pain on their own.  Home Care Stay active.  Start with short walks on flat ground if you can.  Try to walk farther each day. Do not sit, drive or stand in one place for more than 30 minutes.  Do not stay in bed. Do not avoid exercise or work.  Activity can help your back heal faster. Be careful when you bend or lift an object.  Bend at your knees, keep the object close to you, and do not twist. Sleep on a firm mattress.  Lie on your side, and bend your knees.  If you lie on your back, put a pillow under your knees. Only take medicines as told by your doctor. Put ice on the injured area. Put ice in a plastic bag Place a towel between your skin and the bag Leave the ice on for 15-20 minutes, 3-4 times a day for the first 2-3 days. 210 After that, you can switch between ice and heat packs. Ask your doctor about back exercises or massage. Avoid feeling anxious or stressed.  Find good ways to deal with stress, such as exercise.  Get Help Right Way If: Your pain does not go away with rest or medicine. Your pain does not go away in 1 week. You have new  problems. You do not feel well. The pain spreads into your legs. You cannot control when you poop (bowel movement) or pee (urinate) You feel sick to your stomach (nauseous) or throw up (vomit) You have belly (abdominal) pain. You feel like you may pass out (faint). If you develop a fever.  Make Sure you: Understand these instructions. Will watch your condition Will get help right away if you are not doing well or get worse.  Your e-visit answers were reviewed by a board certified advanced clinical practitioner to complete your personal care plan.  Depending on the condition, your plan could have included both over the counter or prescription medications.  If there is a problem please reply  once you have received a response from your provider.  Your safety is important to [place].  If you have drug allergies check your prescription carefully.    You can use MyChart to ask questions about today's visit, request a non-urgent call back, or ask for a work or school excuse for 24 hours related to this e-Visit. If it has been greater than 24 hours you will need to follow up with your provider, or enter a new e-Visit to address those concerns.  You will get an e-mail in the next two days asking about  your experience.  I hope that your e-visit has been valuable and will speed your recovery. Thank you for using e-visits.   I spent approximately 5 minutes reviewing the patient's history, current symptoms and coordinating their care today.

## 2023-11-13 ENCOUNTER — Encounter: Payer: Self-pay | Admitting: Physical Medicine and Rehabilitation

## 2023-11-14 ENCOUNTER — Telehealth (HOSPITAL_BASED_OUTPATIENT_CLINIC_OR_DEPARTMENT_OTHER): Payer: Self-pay | Admitting: Psychiatry

## 2023-11-14 ENCOUNTER — Ambulatory Visit: Payer: 59 | Attending: Physical Medicine and Rehabilitation

## 2023-11-14 ENCOUNTER — Encounter (HOSPITAL_COMMUNITY): Payer: Self-pay

## 2023-11-14 DIAGNOSIS — Z91199 Patient's noncompliance with other medical treatment and regimen due to unspecified reason: Secondary | ICD-10-CM

## 2023-11-14 NOTE — Therapy (Deleted)
OUTPATIENT PHYSICAL THERAPY THORACOLUMBAR EVALUATION   Patient Name: [name] MRN: [id number] DOB:[birth year], 28 y.o., female Today's Date: 11/14/2023  END OF SESSION:   Past Medical History:  Diagnosis Date   Anxiety    Chlamydia 2018, 2021   Chronic kidney disease    kidney stones and kidney infection with last pregnancy   Depression    "gets in her moods, currently ok'   Gonorrhea 2018   Headache    Incisional hernia 04/2013   Infection    UTI   LGSIL on Pap smear of cervix 2019   Ovarian cyst    Urethral diverticulum    Past Surgical History:  Procedure Laterality Date   CHOLECYSTECTOMY  04/03/2012   Procedure: LAPAROSCOPIC CHOLECYSTECTOMY WITH INTRAOPERATIVE CHOLANGIOGRAM;  Surgeon: Velora Heckler, MD;  Location: [facility];  Service: General;  Laterality: N/A;   ERCP  03/12/2012   Procedure: ENDOSCOPIC RETROGRADE CHOLANGIOPANCREATOGRAPHY (ERCP);  Surgeon: Petra Kuba, MD;  Location: [facility] OR;  Service: Endoscopy;  Laterality: N/A;   INGUINAL HERNIA REPAIR N/A 05/15/2013   Procedure: HERNIA REPAIR INCISIONAL;  Surgeon: Velora Heckler, MD;  Location: [facility];  Service: General;  Laterality: N/A;   INSERTION OF MESH N/A 05/15/2013   Procedure: INSERTION OF MESH;  Surgeon: Velora Heckler, MD;  Location:  SURGERY CENTER;  Service: General;  Laterality: N/A;   URETERAL STENT PLACEMENT     WISDOM TOOTH EXTRACTION     Patient Active Problem List   Diagnosis Date Noted   Subjective cognitive impairment 10/15/2023   At risk for obstructive sleep apnea 10/15/2023   Fatigue due to depression 10/15/2023   Morbid obesity (HCC) 10/15/2023   Intractable cluster headache syndrome 10/15/2023   Intractable migraine without aura and with status migrainosus 10/15/2023   Anxiety and depression 04/27/2023   POTS (postural orthostatic tachycardia syndrome) 03/11/2023   Essential hypertension 03/05/2023   Rapid heart rate 03/05/2023   Abnormal Pap smear of  cervix 01/03/2023   Low back pain 02/24/2022   Request for sterilization 08/28/2019   Status post hernia repair 03/26/2019    PCP: Rema Fendt, NP   REFERRING PROVIDER: Juanda Chance, NP  REFERRING DIAG: [id number] (ICD-10-CM) - Chronic bilateral low back pain with right-sided sciatica M54.16 (ICD-10-CM) - Lumbar radiculopathy M43.06 (ICD-10-CM) - Pars defect of lumbar spine  Rationale for Evaluation and Treatment: Rehabilitation  THERAPY DIAG:  No diagnosis found.  ONSET DATE: ***  SUBJECTIVE:  SUBJECTIVE STATEMENT: Plan: Findings:  Chronic, worsening and severe bilateral lower back pain radiating to buttocks, intermittent radiation of pain down right lateral leg. Patient continues to have severe pain despite good conservative therapies such as home exercise regimen, rest and use of medications. Patients clinical presentation and exam are consistent with L5 nerve pattern. There are L5 pars defects noted on recent lumbar radiographs. Given the chronicity of her pain, radicular symptoms and functional difficulties next step is to place order for lumbar MRI imaging. Depending on results of lumbar MRI imaging we discussed possibility of performing lumbar epidural steroid injection. I also placed order for short course of formal physical therapy. I feel she would benefit from core strengthening and developing home exercise regimen. Patient has no questions at this time. I will see her back for lumbar MRI review and to discuss treatment options. No red flag symptoms noted upon exam today.   PERTINENT HISTORY:   Lower Back - Pain    HPI: [name] is a 28 y.o. female who comes in today per the request of Ricky Stabs, NP for evaluation of chronic, worsening and severe bilateral lower back  pain radiating to buttocks, intermittent radiation of pain down right lateral leg. Pain ongoing for 4 years, worsens with prolonged sitting. She describes her pain as sore, aching and grinding sensation, currently rates as 10 out of 10. Some relief of pain with home exercise regimen, rest and use of medications. No relief of pain with oral steroids and Flexeril. No history of formal physical therapy. Recent lumbar x-rays shows slight levoscoliosis, L5 pars defects, no spondylolisthesis noted. No history of lumbar surgery/injections. Patient currently working full time at [employer], she works as Tourist information centre manager. Patient denies focal weakness, numbness and tingling. No recent trauma or falls.   PAIN:  Are you having pain? {OPRCPAIN:27236}  PRECAUTIONS: Other: pars defect  RED FLAGS: None   WEIGHT BEARING RESTRICTIONS: No  FALLS:  Has patient fallen in last 6 months? No  OCCUPATION: [employer]  PLOF: Independent  PATIENT GOALS: To manage my back pain  NEXT MD VISIT: ***  OBJECTIVE:  Note: Objective measures were completed at Evaluation unless otherwise noted.  DIAGNOSTIC FINDINGS:  CLINICAL DATA:  Chronic low back pain   EXAM: LUMBAR SPINE - COMPLETE 4+ VIEW   COMPARISON:  12/29/2021   FINDINGS: Similar slight levoscoliosis of the lower thoracic and lumbar spine measuring approximately 9 degrees (Cobb angle) from T9-L4. SI joints are maintained. Preserved vertebral heights and disc spaces. L5 pars defects noted. No acute osseous finding or fracture. Normal alignment otherwise.   IMPRESSION: 1. L5 pars defects. 2. Slight levoscoliosis. 3. No acute finding by plain radiography.     Electronically Signed   By: Judie Petit.  Shick M.D.   On: 09/22/2023 12:00   PATIENT SURVEYS:  FOTO ***  MUSCLE LENGTH: Hamstrings: Right *** deg; Left *** deg Maisie Fus test: Right *** deg; Left *** deg  POSTURE: {posture:25561}  PALPATION: ***  LUMBAR ROM:   AROM eval   Flexion   Extension   Right lateral flexion   Left lateral flexion   Right rotation   Left rotation    (Blank rows = not tested)  LOWER EXTREMITY ROM:     {AROM/PROM:27142}  Right eval Left eval  Hip flexion    Hip extension    Hip abduction    Hip adduction    Hip internal rotation    Hip external rotation    Knee flexion  Knee extension    Ankle dorsiflexion    Ankle plantarflexion    Ankle inversion    Ankle eversion     (Blank rows = not tested)  LOWER EXTREMITY MMT:    MMT Right eval Left eval  Hip flexion    Hip extension    Hip abduction    Hip adduction    Hip internal rotation    Hip external rotation    Knee flexion    Knee extension    Ankle dorsiflexion    Ankle plantarflexion    Ankle inversion    Ankle eversion     (Blank rows = not tested)  LUMBAR SPECIAL TESTS:  Straight leg raise test: {pos/neg:25243} and Slump test: {pos/neg:25243}  FUNCTIONAL TESTS:  30 seconds chair stand test  GAIT: Distance walked: *** Assistive device utilized: {Assistive devices:23999} Level of assistance: {Levels of assistance:24026} Comments: ***  TREATMENT DATE: ***                                                                                                                                 PATIENT EDUCATION:  Education details: Discussed eval findings, rehab rationale and POC and patient is in agreement  Person educated: Patient Education method: Explanation Education comprehension: verbalized understanding and needs further education  HOME EXERCISE PROGRAM: ***  ASSESSMENT:  CLINICAL IMPRESSION: Patient is a *** y.o. *** who was seen today for physical therapy evaluation and treatment for ***.   OBJECTIVE IMPAIRMENTS: {opptimpairments:25111}.   ACTIVITY LIMITATIONS: {activitylimitations:27494}  PERSONAL FACTORS: {Personal factors:25162} are also affecting patient's functional outcome.   REHAB POTENTIAL: Fair based on chronicity and  underlying   CLINICAL DECISION MAKING: Stable/uncomplicated  EVALUATION COMPLEXITY: Low   GOALS: Goals reviewed with patient? No  SHORT TERM GOALS: Target date: ***  *** Baseline: Goal status: INITIAL  2.  *** Baseline:  Goal status: INITIAL  3.  *** Baseline:  Goal status: INITIAL  4.  *** Baseline:  Goal status: INITIAL  5.  *** Baseline:  Goal status: INITIAL  6.  *** Baseline:  Goal status: INITIAL  LONG TERM GOALS: Target date: ***  *** Baseline:  Goal status: INITIAL  2.  *** Baseline:  Goal status: INITIAL  3.  *** Baseline:  Goal status: INITIAL  4.  *** Baseline:  Goal status: INITIAL  5.  *** Baseline:  Goal status: INITIAL  6.  *** Baseline:  Goal status: INITIAL  PLAN:  PT FREQUENCY: {rehab frequency:25116}  PT DURATION: {rehab duration:25117}  PLANNED INTERVENTIONS: {rehab planned interventions:25118::"97110-Therapeutic exercises","97530- Therapeutic [phone]- Neuromuscular re-education","97535- Self AOZH","08657- Manual therapy"}.  PLAN FOR NEXT SESSION: ***   Hildred Laser, PT 11/14/2023, 11:19 AM

## 2023-11-14 NOTE — Progress Notes (Signed)
Patient is no show.  She is not on video platform and her phone number is block at this time and cannot accept any calls.

## 2023-11-15 ENCOUNTER — Other Ambulatory Visit: Payer: 59

## 2023-11-20 ENCOUNTER — Telehealth: Payer: Self-pay | Admitting: Family

## 2023-11-20 NOTE — Telephone Encounter (Signed)
Placed in providers box.

## 2023-11-22 ENCOUNTER — Ambulatory Visit (HOSPITAL_COMMUNITY): Payer: 59 | Admitting: Clinical

## 2023-12-04 ENCOUNTER — Ambulatory Visit: Payer: 59 | Admitting: Physical Medicine and Rehabilitation

## 2023-12-05 ENCOUNTER — Telehealth: Payer: 59 | Admitting: Physician Assistant

## 2023-12-05 DIAGNOSIS — K047 Periapical abscess without sinus: Secondary | ICD-10-CM

## 2023-12-06 MED ORDER — DOXYCYCLINE HYCLATE 100 MG PO TABS: 100.0000 mg | ORAL_TABLET | Freq: Two times a day (BID) | ORAL | 0 refills | Status: DC

## 2023-12-06 NOTE — Progress Notes (Signed)

## 2023-12-06 NOTE — Progress Notes (Signed)
I have spent 5 minutes in review of e-visit questionnaire, review and updating patient chart, medical decision making and response to patient.   Piedad Climes, PA-C

## 2023-12-07 ENCOUNTER — Telehealth: Payer: Self-pay | Admitting: Family

## 2023-12-07 NOTE — Telephone Encounter (Signed)
Copied from CRM [id number]. Topic: Clinical - Medical Advice >> Dec 07, 2023  3:42 PM Shelah Lewandowsky wrote: Reason for CRM: [name] with [facility] for urinary incontinence supplies, need new order, sent request in January.  Phone [phone]

## 2023-12-10 ENCOUNTER — Telehealth: Payer: Self-pay | Admitting: Family

## 2023-12-10 NOTE — Telephone Encounter (Signed)
Paper is in provider folder to be filled out 12/10/2023

## 2023-12-10 NOTE — Telephone Encounter (Signed)
Placed in provider's box for review.

## 2023-12-13 NOTE — Telephone Encounter (Signed)
Signed 12/11/2023. Scan copy into patient's chart.

## 2023-12-13 NOTE — Telephone Encounter (Signed)
Signed on 12/11/2023. Scan copy into patient's chart.

## 2023-12-14 NOTE — Telephone Encounter (Signed)
Faxed 12/11/2023 with confirmation.Will refax

## 2023-12-14 NOTE — Telephone Encounter (Signed)
Faxed 12/10/2023 with confirmation.Will refax

## 2024-01-02 ENCOUNTER — Encounter: Payer: Self-pay | Admitting: Obstetrics and Gynecology

## 2024-01-07 ENCOUNTER — Encounter: Payer: Self-pay | Admitting: Family

## 2024-01-07 NOTE — Progress Notes (Signed)
 Erroneous encounter-disregard

## 2024-01-10 ENCOUNTER — Ambulatory Visit: Admitting: Obstetrics and Gynecology

## 2024-01-10 VITALS — BP 136/83 | HR 110 | Ht 64.0 in | Wt 277.0 lb

## 2024-01-10 DIAGNOSIS — Z6841 Body Mass Index (BMI) 40.0 and over, adult: Secondary | ICD-10-CM | POA: Diagnosis not present

## 2024-01-10 DIAGNOSIS — Z3169 Encounter for other general counseling and advice on procreation: Secondary | ICD-10-CM

## 2024-01-10 MED ORDER — PRENATAL 28-0.8 MG PO TABS: 1.0000 | ORAL_TABLET | Freq: Every day | ORAL | 3 refills | Status: AC

## 2024-01-10 NOTE — Progress Notes (Signed)
   RETURN GYNECOLOGY VISIT  Subjective:  [name] is a 28 y.o. [phone] with LMP 12/17/23 presenting for preconception counseling  Has SS partner, but they've met with a sperm bank and have selected a potential donor. They are planning to start trying to conceive around August.  She plans to carry the pregnancy as her partner has PCOS and would need ovulation induction to conceive.  History is c/b: - HTN - no meds, normotensive today - POTS - Headaches - BMI 47 - Bipolar disorder - stopped taking rexulti; is not taking any medications for mental health  - Hx PTB - 36wk x 3 but 2 term pregnancies in 2019 & 2021  Due for pap 03/2024  Objective:   Vitals:   01/10/24 1340  BP: 136/83  Pulse: (!) 110  Weight: 277 lb (125.6 kg)  Height: 5\' 4"  (1.626 m)   General:  Alert, oriented and cooperative. Patient is in no acute distress.  Skin: Skin is warm and dry. No rash noted.   Cardiovascular: Normal heart rate noted  Respiratory: Normal respiratory effort, no problems with respiration noted   Assessment and Plan:  [name] is a 28 y.o. presenting for preconception counseling  We reviewed that she has comorbidities that increase the risk of pregnancy.  We discussed the following comorbidities: - BMI 47: Elevated risk of severe maternal morbidity/mortality, higher rates of diabetes, pregnancy associated hypertension, preterm birth, congenital anomalies, fetal growth issues, cesarean birth.  Discussed that weight loss could reduce some of these risks and optimize her health going into pregnancy.  She is accepting of a referral to nutrition/dietitian and this was placed today. -Bipolar disorder: Reviewed risk of exacerbating manic or depressive episodes and the fact that this could have on her health her children's health and the help of a future pregnancy.  Discussed that for many medications the benefits outweigh the risks compared to risks of untreated maternal mental  health.  Strongly recommended that she meets with a psychiatrist to determine an optimal pregnancy safe regimen to be stable on this regimen before trying to conceive.  She has a therapist that she meets with monthly I encouraged her to continue to do so. - HTN: She is not currently on any medications and she is normotensive today.  We discussed the elevated risk of preeclampsia which carries the risks of placental abruption, seizure, stroke, pulmonary edema, kidney/liver injury, iatrogenic preterm birth, stillbirth, and fetal growth restriction.  We discussed the importance of blood pressure control during pregnancy and enhanced surveillance throughout pregnancy. -POTS/headache: Discussed that pregnancy can exacerbate the symptoms of these conditions and that treatments can be slightly limited during pregnancy.  Start prenatal vitamin Return for testing for rubella immunity, HIV, syphilis, hepatitis B and C, gonorrhea and chlamydia Discussed timing of insemination is optimal on the day before or day of ovulation.  She will use ovulation predictor kits.  Total encounter time: 35 minutes  Return in about 3 months (around 04/11/2024) for pap, preconception testing.  Future Appointments  Date Time Provider Department Center  01/30/2024  9:30 AM Sheron Nightingale, RD [facility]    Lennart Pall, MD

## 2024-01-10 NOTE — Patient Instructions (Addendum)
 It was good to see you today! Our goal is to get you in the best state of health possible going into pregnancy.   To do this, we want to the following: - Meet with psychiatry to figure out a pregnancy-safe mental health treatment plan - Meet with nutritionist/dietician to help with weight loss and healthy eating - Start a prenatal vitamin - Meet back in 3-6 months for your pap and to do prenatal testing (rubella status, HIV, syphilis, hepatitis, and gonorrhea/chlamydia testing)   The best time to conceive is the day before & day you ovulate, so use the ovulation predictor kits when you are ready to use donor sperm  If you have any new questions, please let us know.

## 2024-01-30 ENCOUNTER — Ambulatory Visit: Admitting: Dietician

## 2024-01-30 ENCOUNTER — Telehealth: Admitting: Physician Assistant

## 2024-01-30 DIAGNOSIS — H9202 Otalgia, left ear: Secondary | ICD-10-CM | POA: Diagnosis not present

## 2024-01-31 MED ORDER — AZITHROMYCIN 250 MG PO TABS: ORAL_TABLET | ORAL | 0 refills | Status: AC

## 2024-01-31 NOTE — Progress Notes (Signed)
E-Visit for Ear Pain - Acute Otitis Media   We are sorry that you are not feeling well. Here is how we plan to help!  Based on what you have shared with me it looks like you have Acute Otitis Media.  Acute Otitis Media is an infection of the middle or "inner" ear. This type of infection can cause redness, inflammation, and fluid buildup behind the tympanic membrane (ear drum).  The usual symptoms include: Earache/Pain Fever Upper respiratory symptoms Lack of energy/Fatigue/Malaise Slight hearing loss gradually worsening- if the inner ear fills with fluid What causes middle ear infections? Most middle ear infections occur when an infection such as a cold, leads to a build-up of mucus in the middle ear and causes the Eustachian tube (a thin tube that runs from the middle ear to the back of the nose) to become swollen or blocked.   This means mucus can't drain away properly, making it easier for an infection to spread into the middle ear.  How middle ear infections are treated: Most ear infections clear up within three to five days and don't need any specific treatment. If necessary, tylenol or ibuprofen should be used to relieve pain and a high temperature.  If you develop a fever higher than 102, or any significantly worsening symptoms, this could indicate a more serious infection moving to the middle/inner and needs face to face evaluation in an office by a provider.   Antibiotics aren't routinely used to treat middle ear infections, although they may occasionally be prescribed if symptoms persist or are particularly severe. Given your presentation,   I have prescribed Azithromycin 250 mg two tablets by mouth on day 1, then 1 tablet by mouth daily until completed    Your symptoms should improve over the next 3 days and should resolve in about 7 days. Be sure to complete ALL of the prescription(s) given.  HOME CARE: Wash your hands frequently. If you are prescribed an ear drop, do not  place the tip of the bottle on your ear or touch it with your fingers. You can take Acetaminophen 650 mg every 4-6 hours as needed for pain.  If pain is severe or moderate, you can apply a heating pad (set on low) or hot water bottle (wrapped in a towel) to outer ear for 20 minutes.  This will also increase drainage.  GET HELP RIGHT AWAY IF: Fever is over 102.2 degrees. You develop progressive ear pain or hearing loss. Ear symptoms persist longer than 3 days after treatment.  MAKE SURE YOU: Understand these instructions. Will watch your condition. Will get help right away if you are not doing well or get worse.  Thank you for choosing an e-visit.  Your e-visit answers were reviewed by a board certified advanced clinical practitioner to complete your personal care plan. Depending upon the condition, your plan could have included both over the counter or prescription medications.  Please review your pharmacy choice. Make sure the pharmacy is open so you can pick up the prescription now. If there is a problem, you may contact your provider through MyChart messaging and have the prescription routed to another pharmacy.  Your safety is important to us. If you have drug allergies check your prescription carefully.   For the next 24 hours you can use MyChart to ask questions about today's visit, request a non-urgent call back, or ask for a work or school excuse. You will get an email with a survey after your eVisit asking about   your experience. We would appreciate your feedback. I hope that your e-visit has been valuable and will aid in your recovery.   

## 2024-01-31 NOTE — Progress Notes (Signed)
 Message sent to patient requesting further input regarding current symptoms. Awaiting patient response.

## 2024-01-31 NOTE — Progress Notes (Signed)
 I have spent 5 minutes in review of e-visit questionnaire, review and updating patient chart, medical decision making and response to patient.   Piedad Climes, PA-C

## 2024-02-24 ENCOUNTER — Telehealth: Admitting: Family Medicine

## 2024-02-24 DIAGNOSIS — J069 Acute upper respiratory infection, unspecified: Secondary | ICD-10-CM | POA: Diagnosis not present

## 2024-02-24 MED ORDER — FLUTICASONE PROPIONATE 50 MCG/ACT NA SUSP
2.0000 | Freq: Every day | NASAL | 6 refills | Status: AC
Start: 2024-02-24 — End: ?

## 2024-02-24 MED ORDER — BENZONATATE 200 MG PO CAPS: 200.0000 mg | ORAL_CAPSULE | Freq: Two times a day (BID) | ORAL | 0 refills | Status: AC | PRN

## 2024-02-24 NOTE — Progress Notes (Signed)

## 2024-03-19 ENCOUNTER — Telehealth: Payer: Self-pay | Admitting: Family

## 2024-03-19 NOTE — Telephone Encounter (Signed)
 I gave documentation to PCP on 03/19/2024

## 2024-03-19 NOTE — Telephone Encounter (Signed)
 A document form from [employer] has been faxed: Written order for breast pump, to be filled out by provider. Send document back via Fax within 7-days. Document is located in providers tray at front office.          Fax number: [phone]

## 2024-03-20 ENCOUNTER — Telehealth: Payer: Self-pay | Admitting: Family

## 2024-03-20 NOTE — Telephone Encounter (Signed)
 I called patient to give her PCP recommendations and it said that number was unavailable at this time

## 2024-03-26 NOTE — Telephone Encounter (Signed)
Phone number is not valid

## 2024-03-26 NOTE — Telephone Encounter (Signed)
 Patient is pregnant and needs to have form filled out by OBGYN I tried calling her however her phone is not working.

## 2024-05-08 ENCOUNTER — Telehealth: Payer: Self-pay | Admitting: Family

## 2024-05-08 NOTE — Telephone Encounter (Signed)
 A document form from [employer] has been faxed: WRITTEN ORDER FOR BREAST PUMP, to be filled out by provider. Send document back via Fax within 7-days. Document is located in providers tray at front office.          Fax number: [phone]

## 2024-05-08 NOTE — Telephone Encounter (Signed)
 I called patient no one answered so I left a voicemail to return my call

## 2024-05-08 NOTE — Telephone Encounter (Signed)
 I called patient no one answered so I left a voicemail for her to return my call

## 2024-05-14 NOTE — Telephone Encounter (Signed)
 Faxed paperwork back today.

## 2024-08-25 ENCOUNTER — Encounter: Payer: Self-pay | Admitting: Radiology
